# Patient Record
Sex: Male | Born: 1940
Health system: Southern US, Community
[De-identification: ages and names within clinical notes are randomized; demographics above are authoritative.]

## PROBLEM LIST (undated history)

## (undated) DIAGNOSIS — K449 Diaphragmatic hernia without obstruction or gangrene: Secondary | ICD-10-CM

## (undated) DIAGNOSIS — S065XAA Traumatic subdural hemorrhage with loss of consciousness status unknown, initial encounter: Secondary | ICD-10-CM

## (undated) DIAGNOSIS — R0609 Other forms of dyspnea: Secondary | ICD-10-CM

## (undated) DIAGNOSIS — J9611 Chronic respiratory failure with hypoxia: Secondary | ICD-10-CM

## (undated) DIAGNOSIS — J449 Chronic obstructive pulmonary disease, unspecified: Secondary | ICD-10-CM

## (undated) DIAGNOSIS — M1990 Unspecified osteoarthritis, unspecified site: Secondary | ICD-10-CM

## (undated) DIAGNOSIS — Z973 Presence of spectacles and contact lenses: Secondary | ICD-10-CM

## (undated) DIAGNOSIS — G4733 Obstructive sleep apnea (adult) (pediatric): Secondary | ICD-10-CM

## (undated) DIAGNOSIS — E119 Type 2 diabetes mellitus without complications: Secondary | ICD-10-CM

## (undated) DIAGNOSIS — N471 Phimosis: Secondary | ICD-10-CM

## (undated) DIAGNOSIS — Z9889 Other specified postprocedural states: Secondary | ICD-10-CM

## (undated) DIAGNOSIS — R0600 Dyspnea, unspecified: Secondary | ICD-10-CM

## (undated) DIAGNOSIS — Z8709 Personal history of other diseases of the respiratory system: Secondary | ICD-10-CM

## (undated) DIAGNOSIS — Z9989 Dependence on other enabling machines and devices: Secondary | ICD-10-CM

## (undated) DIAGNOSIS — I10 Essential (primary) hypertension: Secondary | ICD-10-CM

## (undated) HISTORY — PX: BACK SURGERY: SHX140

## (undated) HISTORY — PX: TONSILLECTOMY AND ADENOIDECTOMY: SUR1326

## (undated) HISTORY — PX: LUMBAR SPINE SURGERY: SHX701

---

## 1998-06-23 ENCOUNTER — Ambulatory Visit (HOSPITAL_COMMUNITY): Admission: RE | Admit: 1998-06-23 | Discharge: 1998-06-23 | Payer: Self-pay

## 2002-01-26 ENCOUNTER — Encounter (INDEPENDENT_AMBULATORY_CARE_PROVIDER_SITE_OTHER): Payer: Self-pay | Admitting: *Deleted

## 2002-01-26 ENCOUNTER — Ambulatory Visit (HOSPITAL_COMMUNITY): Admission: RE | Admit: 2002-01-26 | Discharge: 2002-01-26 | Payer: Self-pay | Admitting: Gastroenterology

## 2006-12-06 HISTORY — PX: KNEE ARTHROSCOPY: SHX127

## 2007-02-18 ENCOUNTER — Ambulatory Visit: Payer: Self-pay | Admitting: Emergency Medicine

## 2007-02-18 ENCOUNTER — Inpatient Hospital Stay (HOSPITAL_COMMUNITY): Admission: EM | Admit: 2007-02-18 | Discharge: 2007-02-23 | Payer: Self-pay | Admitting: Emergency Medicine

## 2007-03-02 ENCOUNTER — Ambulatory Visit: Payer: Self-pay | Admitting: Emergency Medicine

## 2007-04-11 ENCOUNTER — Ambulatory Visit: Payer: Self-pay | Admitting: Emergency Medicine

## 2007-07-26 ENCOUNTER — Ambulatory Visit: Payer: Self-pay | Admitting: Emergency Medicine

## 2007-08-28 ENCOUNTER — Ambulatory Visit: Payer: Self-pay | Admitting: Emergency Medicine

## 2007-09-06 DIAGNOSIS — G4733 Obstructive sleep apnea (adult) (pediatric): Secondary | ICD-10-CM

## 2007-09-06 DIAGNOSIS — I10 Essential (primary) hypertension: Secondary | ICD-10-CM

## 2007-09-06 DIAGNOSIS — M1990 Unspecified osteoarthritis, unspecified site: Secondary | ICD-10-CM | POA: Insufficient documentation

## 2007-09-06 DIAGNOSIS — J449 Chronic obstructive pulmonary disease, unspecified: Secondary | ICD-10-CM

## 2007-09-06 DIAGNOSIS — M5136 Other intervertebral disc degeneration, lumbar region: Secondary | ICD-10-CM

## 2007-09-06 DIAGNOSIS — Z6834 Body mass index (BMI) 34.0-34.9, adult: Secondary | ICD-10-CM

## 2007-10-02 ENCOUNTER — Ambulatory Visit: Payer: Self-pay | Admitting: Emergency Medicine

## 2008-04-27 ENCOUNTER — Emergency Department (HOSPITAL_COMMUNITY): Admission: EM | Admit: 2008-04-27 | Discharge: 2008-04-27 | Payer: Self-pay | Admitting: Emergency Medicine

## 2008-11-22 ENCOUNTER — Telehealth (INDEPENDENT_AMBULATORY_CARE_PROVIDER_SITE_OTHER): Payer: Self-pay | Admitting: *Deleted

## 2008-12-11 ENCOUNTER — Ambulatory Visit: Payer: Self-pay | Admitting: Emergency Medicine

## 2011-04-20 NOTE — Assessment & Plan Note (Signed)
[facility]                             PULMONARY OFFICE NOTE   NAME:NIEMCZURAKaydn, Kumpf                     MRN:          [id number]  DATE:07/26/2007                            DOB:          14-Aug-1941    SUBJECTIVE:  Mr. Brent Snow is a 70 year old man with a history of  tobacco use and obstructive sleep apnea who presents today in followup  for his exertional dyspnea.  Since our last visit, he has had pulmonary  function testing which we are going to review today.  He tells me that  he occasionally has had some shortness of breath with exertion, he has  to stop to rest after about an hour of working in his yard.  He denies  any cough.  He has not had any wheezing.  He is wearing his CPAP  reliably.  He does not use oxygen during the day, but does have oxygen  bled into his CPAP at night.   CURRENT MEDICATIONS:  1. Caduet 5/20 mg once daily.  2. Oxygen 2 L per minute nightly.  3. CPAP nightly.   PHYSICAL EXAMINATION:  IN GENERAL:  This is a pleasant, obese man who is  no distress on room air.  His weight is 243 pounds which is up 4 pounds  from our last visit.  Temperature 97.7, blood pressure 158/88, heart  rate 79, SPO2 91% on room air.  HEENT EXAM:  The oropharynx is narrow with some posterior pharyngeal  erythema.  His neck is supple without lymphadenopathy or stridor.  His lungs are clear to auscultation bilaterally but are distant.  There  is no wheezing on a forced expiration.  HEART:  Regular without murmur.  ABDOMEN:  Obese, soft, nontender with positive bowel sounds.  EXTREMITIES:  Have some trace pretibial edema.  Neurologically he has nonfocal exam.   Pulmonary function testing performed on July 26, 2007 shows moderate  airflow limitation without a significant bronchodilator response.  His  lung volumes are hyperinflated, and his diffusion capacity is decreased  but corrects into the normal range when adjusted for alveolar  volume.   IMPRESSION:  1. Moderate chronic obstructive pulmonary disease, not currently on      bronchodilators.  2. Obstructive sleep apnea with good compliance with his continuous      positive airway pressure.   PLAN:  1. We will initiate Spiriva 1 inhalation daily, and albuterol 2 puffs      q.4 h. p.r.n.  He will follow up with me in 1 month to assess his      improvement on standing bronchodilators.  2. He will continue his continuous positive airway pressure as      ordered.  3. I will discontinue his home oxygen.     Leslye Peer, MD  Electronically Signed    RSB/MedQ  DD: 07/26/2007  DT: 07/27/2007  Job #: [id number]   cc:   Tammy R. Collins Scotland, M.D.

## 2011-04-20 NOTE — Assessment & Plan Note (Signed)
[facility]                             PULMONARY OFFICE NOTE   NAME:NIEMCZURANathanel, Brent Snow                     MRN:          [id number]  DATE:08/28/2007                            DOB:          [birth year]    SUBJECTIVE:  Mr. Brent Snow is a 70 year old man with a history of  tobacco abuse and obstructive sleep apnea.  I have been following him  for exertional dyspnea. We placed him on Spiriva in the setting of air  flow limitation from PFTs from July 26, 2007.  He follows up today for  review of his progress since starting Spiriva.  He tells me that he has  been wearing his CPAP reliably.  He gets his device from [place].  He has a nasal mask which is uncomfortable, he wears it  about 5 out of 7 days of the week.  He has not noticed any difference in  his breathing since he began the Spiriva. He has been active and  continues to work.   CURRENT MEDICATIONS:  1. CPAP as indicated above.  2. Spiriva one inhalation daily.  3. Albuterol two puffs q.4 hours p.r.n. for shortness of breath.   EXAMINATION:  GENERAL:  This is an obese man in no distress on room air.  VITAL SIGNS:  Weight 247 pounds up four pounds from our last visit.  Temperature 98.1, blood pressure 168/86, heart rate 84, his PO2 is 92%  on room air.  With ambulation today he dropped 85% after 400 feet on  room air.  HEENT EXAM:  Benign.  LUNGS:  Clear but distant, there is no wheezing on forced expiration.  HEART:  Regular without murmur.  ABDOMEN:  Obese, soft, nontender with positive bowel sounds.  EXTREMITIES:  Have no significant edema.  NEUROLOGIC:  He has a nonfocal exam.   IMPRESSION:  1. Moderate chronic obstructive pulmonary disease.  2. Obstructive sleep apnea.  3. Exertional hypoxemia.   PLAN:  1. Mr. Brent Snow wants to have a trial off of bronchodilators to see      if he has any functional decline.  He will stop his Spiriva and use      albuterol on an as  needed basis.  2. Chronic obstructive pulmonary disease - use CPAP as ordered.  3. I underscored with Mr. Brent Snow that he needs exertional oxygen      based on today's ambulatory oximetry.  Unfortunately he is not      ready to do this and he refused.  We will discuss it further at our      next visit.  4. I will follow up with Mr. Brent Snow in 1 month to see if he      tolerated discontinuation of his Spiriva and to re-discuss oxygen.     Leslye Peer, MD  Electronically Signed    RSB/MedQ  DD: 09/14/2007  DT: 09/14/2007  Job #: [id number]   cc:   Tammy R. Collins Scotland, M.D.

## 2011-04-20 NOTE — Assessment & Plan Note (Signed)
[facility]                             PULMONARY OFFICE NOTE   NAME:[name], [name]                     MRN:          [id number]  DATE:10/02/2007                            DOB:          [birth year]    Mr. Brent Snow is a 70 year old man with COPD and exertional hypoxemia.  He also has long-standing obstructive sleep apnea that has been stable  on CPAP.  At our last visit, we decided to discontinue his Spiriva to  see if he could tolerate being off maintenance therapy.  He tells me  that initially he had done quite well off of the Spiriva.  Did not  notice any difference in his breathing or ability to exert himself.  He  has caught an upper respiratory infection that has been bothering him  for the last several days.  He has had nasal congestion and nasal  drainage.  He denies any wheezing or cough.  He has been using his  oxygen only on an as needed basis, although we have documented that he  desaturates with exertion.   MEDICATIONS:  Albuterol 2 puffs q.4h p.r.n. shortness of breath.   EXAM:  GENERAL:  This is an obese gentleman in no distress.  Weight is 256 pounds, up 9 pounds from our last visit.  Temperature  98.6, blood pressure 182/102.  Heart rate is 78, SpO2 90% on room air.  HEENT:  Posterior pharynx is narrow with some erythema.  NECK:  Supple without lymphadenopathy or stridor.  LUNGS:  Distant but clear to auscultation bilaterally.  He has no  crackles or wheezes.  HEART:  Regular without a murmur.  ABDOMEN:  Obese, soft, and nontender with positive bowel sounds.  EXTREMITIES:  Trace to 1+ pretibial edema.   IMPRESSION:  1. Chronic obstructive pulmonary disease, now off of maintenance      bronchodilators.  He tells me that he feels stable off of his      Spiriva.  He will continue his albuterol on an as needed basis.  We      discussed the benefits of being back on a scheduled bronchodilator.      He is not interested in doing  this at this time.  2. Upper respiratory infection.  He will use symptomatic therapy,      including Tylenol and Theraflu.  He will call me if he has signs or      symptoms consistent with an exacerbation of his chronic obstructive      pulmonary disease.  We reviewed these today.  3. Obstructive sleep apnea on CPAP.  He will continue his CPAP as      ordered.  4. Exertional hypoxemia.  I discussed wearing oxygen reliably with      him.  This may help his hypertension and his fluid retention.  He      tells me that he is not going to use the oxygen with all exertion.      He will use it only on an as needed basis.  5. Hypertension, which is significantly elevated today.  I offered to  recheck his blood pressure or to restart his Caduet.  He is not      interested in doing either at our visit today.  He says that he      believes that his hypertension is due to his increased weight.  He      does not want any      therapy at this time.  I encouraged him to have his blood pressure      rechecked by his primary physician, Brent Snow.  He stated that he      would do so.     Leslye Peer, MD  Electronically Signed    RSB/MedQ  DD: 10/02/2007  DT: 10/03/2007  Job #: [id number]   cc:   Brent Snow, M.D.

## 2011-04-23 NOTE — Assessment & Plan Note (Signed)
[facility]                             PULMONARY OFFICE NOTE   NAME:NIEMCZURANirav, Sweda                     MRN:          [id number]  DATE:04/11/2007                            DOB:          [birth year]    SUBJECTIVE:  Mr. Hochstetler is a 70 year old gentleman who follows up  today for a regularly scheduled visit.  He has a history of tobacco  abuse, hypertension, obstructive sleep apnea on CPAP.  He was admitted  to the hospital in mid-March for community-acquired pneumonia that  resulted in respiratory failure and required mechanical ventilation.  He  was since discharged to home, has been seen at the end of March by Rikki Spearing in our office.  He tells me that, since that time, he has  continued to stay off cigarettes.  His quit date was February 18, 2007.  He  is not experiencing any significant dyspnea.  He is wearing his CPAP  fairly reliably.  He does take if off sometimes in the middle of the  night.  He is not using any bronchodilators.  He does occasionally wear  oxygen during the day, although not because he is having any particular  shortness of breath.   CURRENT MEDICATIONS:  1. Caduet 5/20 mg once a day.  2. Oxygen 2 liter per minute p.r.n.  3. CPAP q.h.s.   EXAMINATION:  GENERAL:  This well-appearing obese gentleman in no  distress.  VITAL SIGNS:  His weight is 239 pounds, temperature 98.0, blood pressure  174/92, heart rate 58, SGO2 96% on room air.  HEENT:  Oropharynx is benign.  NECK:  Large, supple without lymphadenopathy or stridor.  LUNGS:  Distant but clear.  He is not having any wheezing or any forced  expiration.  HEART:  Regular rate and rhythm without murmur.  ABDOMEN:  Obese, soft, nontender with positive bowel sounds.  EXTREMITIES:  No cyanosis, clubbing or edema.   IMPRESSION:  1. History of tobacco use and probable COPD, not currently on      bronchodilators.  2. Obstructive sleep apnea with fair compliance with his  CPAP.  3. Hypoxemia with exertion that has resolved.   PLAN:  1. Will discontinue his oxygen during the day.  He will continue to      use it, blow it into his CPAP at night.  2. I will schedule him for full pulmonary function testing to follow      up in one month, quantify his air flow limitation.  If he does      indeed have significant COPD, then will start him on daily      bronchodilators.  He did not tolerate Advair, so will probably      choose Spiriva.  I      congratulated him and encouraged him to continue his tobacco      cessation.  3. I will follow with Mr. Ricketson in approximately one month.     Leslye Peer, MD  Electronically Signed    RSB/MedQ  DD: 04/11/2007  DT: 04/11/2007  Job #: [id number]

## 2011-04-23 NOTE — Discharge Summary (Signed)
Brent Snow, Brent Snow            ACCOUNT NO.:  [id number]   MEDICAL RECORD NO.:  [id number]          PATIENT TYPE:  INP   LOCATION:  5709                         FACILITY:  [facility]   PHYSICIAN:  Charlcie Cradle. Delford Field, MD, FCCPDATE OF BIRTH:  16-Aug-1941   DATE OF ADMISSION:  02/18/2007  DATE OF DISCHARGE:  02/23/2007                               DISCHARGE SUMMARY   DISCHARGE DIAGNOSES:  1. Status post acute respiratory failure with ventilator-dependent      respiratory failure secondary to community-acquired pneumonia (no      organism specified).  2. Hypertension.  3. Obstructive sleep apnea.   LABORATORY DATA:  Date:  February 23, 2007:  Phosphorus 3.5, magnesium 2.5.  White blood cells 7.3, hemoglobin 14.8, hematocrit 44.4, platelet count  166.  Sodium 140, potassium 3.9, chloride 100, CO2 35, glucose 126, BUN  17, creatinine 0.77.   MICROBIOLOGY:  Bronchoalveolar lavage obtained February 19, 2007:  Negative  for growth.  Date:  February 18, 2007:  Blood cultures x2 both no growth to  date.   RADIOLOGY:  Date:  February 22, 2007:  Demonstrating interval improvement  in bibasilar atelectases/infiltrate.   Date:  February 18, 2007:  CT scan of chest negative for pulmonary emboli.  Bilateral lower lobe consolidation consistent with pneumonia.   BRIEF HISTORY:  This is a 70 year old white male patient with a history  of tobacco abuse, obstructive sleep apnea, hypertension, and medical  noncompliance.  He reported to [facility] with worsening  shortness of breath, fever of 103, and lethargy.  Per his wife, this  initially began approximately over a week's time with progressive  shortness of breath, fever, flu-like symptoms, congestion, nonproductive  cough, and wheeze.  He was seen in the emergency room, identified to  have saturation at 50% on initial evaluation, as well as respiratory  acidosis.  He was placed on noninvasive positive-pressure ventilation;  however, did not tolerate  this and, therefore, was intubated.  His blood  pressure initially was as low as the 70s systolically requiring  aggressive volume resuscitation.  He was admitted to the pulmonary  critical care service.   HOSPITAL COURSE BY DISCHARGE DIAGNOSIS:  1. Acute hypercapnic respiratory failure with ventilator-dependent      respiratory failure in the setting of bilateral community-acquired      pneumonia (no organism specified).  Mr. Brent Snow was admitted to      the pulmonary critical care service in the intensive care setting.      He was continued on mechanical ventilation.  IV empiric antibiotics      consisted of azithromycin and Rocephin.  This was initiated on      hospital admit day, then transitioned to oral Avelox on February 22, 2007.  Mr. Brosnahan was successfully extubated on February 21, 2007.      He continues to require supplemental oxygen.  Currently, his      saturations do decrease down to 85% with ambulation on flat land in      the hallway.  Therefore, he will be sent home on supplemental  oxygen at 2 liters, this has been already set up by myself through      [employer] which is the medical equipment supplier for      Mr. Clos's CPAP machine.  Upon time of discharge, he is close      to baseline functioning with the exception of requiring      supplemental oxygen.  He will be discharged to home on daily Avelox      times 5 more days, as well as twice daily Advair, and close      pulmonary followup in the outpatient setting.  2. Hypertension.  Brent Snow has been instructed to fill his Caduet      prescription, which he has had and has not filled in the past.  And      he will, therefore, be discharged to home with these instructions.  3. Obstructive sleep apnea, would question whether or not Mr.      Snow actually has an element of chronic respiratory failure      given his elevated bicarbonate level at 35.  For now, Brent Snow      will be  discharged to home, instructed to continue his CPAP as      previously prescribed.  It should be noted that Brent Snow has      not used his CPAP machine as prescribed in the past.  I have      highlighted how important it is that he be compliant with his      therapy and the risks of hypoxia down the road.  Additionally, I      have requested [facility] assist with setting up his      CPAP machine so that 2 liters oxygen be bled into this during      bedtime hours.   PHYSICAL EXAMINATION UPON TIME OF DISCHARGE:  VITAL SIGNS:  Blood  pressure 174/88.  Vital signs stable, afebrile.  CHEST:  Sounds improving on exam.  CARDIAC:  Regular rate and rhythm.  ABDOMEN:  Soft, nontender.  EXTREMITIES:  Without edema.  NEUROLOGIC:  Neurologically, grossly intact.   Again, walking oximetries do demonstrate significant desaturation down  to 85%, which is markedly improved on supplemental oxygen at 2 liters.  He will, therefore, be discharged to home on supplemental oxygen.   DISCHARGE MEDICATIONS AND INSTRUCTIONS AS FOLLOWS:  1. Diet as tolerated.  2. Followup:      a.     Rubye Oaks, Nurse Practitioner, Thursday the 27th at       10:15 a.m.      b.     Dr. Delton Coombes, April 16, at 2:50 p.m.  3. Medications:      a.     Advair 250/50 one puff twice a day.      b.     Avelox 400 mg tab daily times 5 more days.      c.     Prednisone 10 mg tab 4 tabs a day times 3 days, then 3 tabs       a day times 3 days, then 2 tabs a day times 3 days, then 1 tab a       day times 3 days, then stop.      d.     Caduet 5/20 one tab daily.      e.     Oxygen 2 liters with activity and to be bled into the CPAP       machine.  Zenia Resides, NP      Charlcie Cradle. Delford Field, MD, [facility]  Electronically Signed    PB/MEDQ  D:  02/23/2007  T:  02/23/2007  Job:  [id number]   cc:   Rubye Oaks, NP  Leslye Peer, MD

## 2011-04-23 NOTE — Assessment & Plan Note (Signed)
[facility]                             PULMONARY OFFICE NOTE   NAME:NIEMCZURAConnelly, [place]                     MRN:          [id number]  DATE:03/02/2007                            DOB:          [birth year]    HISTORY OF PRESENT ILLNESS:  This is a 70 year old, white male patient,  new patient to our practice that presents for a post hospitalization  visit.  The patient was admitted on March 15 through March 20 for  progressively worsening shortness of breath, fever, T-max of 103, and  lethargy.  Patient has a known history of obstructive sleep apnea and  hypertension followed by Dr. Yehuda Budd for primary care.  The patient was  seen in the emergency room and on initial presentation had O2 saturation  of around 50%.  The patient was placed on a noninvasive positive  pressure ventilation; however, he was unable to tolerate this and  therefore was intubated and required mechanical ventilatory support.  Patient also was hypotensive with systolic pressures around 70.  The  patient was started on empiric antibiotics including Zithromycin and  Rocephin.  Chest x-ray showed bilateral lobe consolidation consistent  with pneumonia.  Blood cultures x2 were no growth to date, and a BAL was  negative for growth.  Patient was successfully extubated on March 18th  and was transitioned over to oral antibiotics.  Patient was discharged  on five additional days of Avelox and a prednisone taper, and patient  continued to desaturate to the mid to upper 80s with walking and was  discharged on oxygen with activities at 2 liters.  Patient does have  obstructive sleep apnea and had a CPAP at home.  Patient does report he  was not using his CPAP prior to hospitalization.  Since discharge, the  patient reports he is much improved with decreased cough, congestion,  and shortness of breath.  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  Patient  reports he does become  short of breath at times; however, activity tolerance seems to be  improving daily.  Patient denies any purulent sputum, hemoptysis, chest  pain, orthopnea, PND, or leg swelling.  The patient is also using his  CPAP at night.  The patient was also smoking prior to hospitalization,  however, assures me he has not returned to smoking as of yet.   PAST MEDICAL HISTORY:  1. Hypertension.  2. DJD and degenerative disk disease with previous back surgery in      1977.  3. Obstructive sleep apnea previously on CPAP.  4. Morbid obesity.   CURRENT MEDICATIONS:  1. Advair 250/50 twice daily (this was a new medication at discharge).  2. Caduet 5/20 q.h.s. (this was started at discharge).  3. Oxygen 2 liters with activity and a CPAP machine at night.   ALLERGIES:  No known drug allergies.   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  He is married.  He has two children.  He has smoked a  pack a day for the last 50 years.  He denies any alcohol use.  He is  followed for primary care issues by Dr. Yehuda Budd.   PHYSICAL EXAMINATION:  GENERAL:  The patient is a morbidly obese male.  VITAL SIGNS:  He is afebrile with a blood pressure of 142/84, pulse 72,  O2 saturation is 95% on room air.  Walking O2 saturation at 91%.  Weight  is at 235.  Height is at 5'6.  HEENT:  Unremarkable.  NECK:  Supple without cervical adenopathy.  No JVD.  LUNGS:  Lung sounds reveal coarse breath sounds without any wheezing or  crackles.  CARDIAC:  Regular rate and rhythm.  ABDOMEN:  Morbidly obese, soft, and nontender.  EXTREMITIES:  Warm without any calf tenderness, cyanosis or clubbing;  there is trace edema.  NEURO:  Alert and oriented x3.   IMPRESSION AND PLAN:  1. Status post acute respiratory failure with ventilator-dependent      respiratory failure secondary to a community-acquired pneumonia.      Patient is recommended to finish Avelox and  prednisone.  He is to      continue on Advair 250/50 twice daily.  He will follow back up with      Dr. Delton Coombes on April 16th at 2:50.  I have still recommended patient      to use oxygen with activity, and we will determine how much longer      oxygen needs to be used once he has followed up in two weeks with      Dr. Delton Coombes.  Patient is congratulated on his continued smoking      cessation and have encouraged him to continue no smoking.  2. Obstructive sleep apnea.  Patient will continue on nocturnal      continuous positive airway pressure.      Rubye Oaks, NP  Electronically Signed      Leslye Peer, MD  Electronically Signed   TP/MedQ  DD: 03/02/2007  DT: 03/02/2007  Job #: [id number]

## 2011-04-23 NOTE — Procedures (Signed)
[facility]  Patient:    Brent Snow, Brent Snow Visit Number: [id number] MRN: [id number]          Service Type: END Location: ENDO Attending Physician:  Louie Bun Dictated by:   Everardo All Madilyn Fireman, M.D. Proc. Date: 01/26/02 Admit Date:  01/26/2002   CC:         Tammy R. Collins Scotland, M.D.   Procedure Report  PROCEDURE:  Esophagogastroduodenoscopy with esophageal dilatation.  INDICATIONS FOR PROCEDURE:  Intermittent solid food dysphagia in a patient also undergoing colonoscopy today.  DESCRIPTION OF PROCEDURE:  The patient was placed in the left lateral decubitus position then placed on the pulse monitor with continuous low flow oxygen delivered by nasal cannula. He was sedated with 50 mg IV Demerol and 4 mg IV Versed. The Olympus video endoscope was advanced under direct vision into the oropharynx and esophagus. The esophagus was slightly tortuous and somewhat dilated distally with the squamocolumnar line at 36 cm above a 2 cm hiatal hernia. There did appear to be moderate inflammation and stricturing of the GE junction. There was a slight amount of resistance to passage of the scope beyond it. The stomach was entered and a small amount of liquid secretions were suctioned from the fundus. Retroflexed view of the cardia was unremarkable except for a small hiatal hernia. The fundus, body, antrum, and pylorus all appeared normal. The duodenum was entered and both the bulb and second portion were well inspected and appeared to be within normal limits. A Savary guidewire was placed through the endoscope channel into the duodenum and the scope withdrawn. Savary dilators of 12.8, 14, and 15 mm were passed over the guidewire with mild resistance and no blood seen on any of the dilators. The last dilators were removed together with the wire and the patient returned to the recovery room in stable condition. The patient tolerated the procedure well and there were no  immediate complications.  IMPRESSION:  1. Moderately inflamed esophageal stricture.  2. Hiatal hernia.  PLAN:  Will begin proton pump inhibitor and follow-up in the office in a few weeks to assess response to dilatation. Dictated by:   Everardo All Madilyn Fireman, M.D. Attending Physician:  Louie Bun DD:  01/26/02 TD:  01/26/02 Job: [id number]

## 2011-04-23 NOTE — Procedures (Signed)
[facility]  Patient:    Brent Snow, Brent Snow Visit Number: [id number] MRN: [id number]          Service Type: END Location: ENDO Attending Physician:  Louie Bun Dictated by:   Everardo All Madilyn Fireman, M.D. Proc. Date: 01/26/02 Admit Date:  01/26/2002   CC:         Tammy R. Collins Scotland, M.D.   Procedure Report  PROCEDURE:  Colonoscopy.  INDICATION FOR PROCEDURE:  Screening colonoscopy in a 70 year old patient with no previous colon screening.  DESCRIPTION OF PROCEDURE:  The patient was placed in the left lateral decubitus position and placed on the pulse monitor with continuous low flow oxygen delivered by nasal cannula. He was sedated with 50 mg IV Demerol and  3 mg IV Versed in addition to the 50 mg Demerol and 4 mg Versed given for the previous EGD with esophageal dilatation.  The Olympus video colonoscope was inserted into the rectum and advanced to the cecum, confirmed by transillumination at McBurneys point and visualization of the ileocecal valve and appendiceal orifice. The prep was good. The cecum, ascending, transverse, descending and sigmoid colon all appeared normal with no masses, polyps, diverticula or other mucosal abnormalities. Within the rectum at approximately 10 cm, there was a 6 mm sessile polyp which was fulgurated by hot biopsy, otherwise the rectum appeared normal. The colonoscope was then withdrawn and the patient returned to the recovery room in stable condition. The patient tolerated the procedure well and there were no immediate complications.  IMPRESSION:  Small rectal polyp otherwise normal colonoscopy.  PLAN:  Await pathology for determination of method and interval for future colon screening. Dictated by:   Everardo All Madilyn Fireman, M.D. Attending Physician:  Louie Bun DD:  01/26/02 TD:  01/26/02 Job: [id number]

## 2013-02-07 ENCOUNTER — Other Ambulatory Visit (HOSPITAL_COMMUNITY): Payer: Self-pay | Admitting: Family Medicine

## 2013-02-12 ENCOUNTER — Ambulatory Visit (HOSPITAL_COMMUNITY)
Admission: RE | Admit: 2013-02-12 | Discharge: 2013-02-12 | Disposition: A | Discharge status: Home / Self Care | Payer: Medicare Other | Source: Ambulatory Visit | Attending: Family Medicine | Admitting: Family Medicine

## 2013-02-12 DIAGNOSIS — R222 Localized swelling, mass and lump, trunk: Secondary | ICD-10-CM | POA: Insufficient documentation

## 2013-02-12 DIAGNOSIS — R911 Solitary pulmonary nodule: Secondary | ICD-10-CM | POA: Insufficient documentation

## 2013-02-12 DIAGNOSIS — Z85118 Personal history of other malignant neoplasm of bronchus and lung: Secondary | ICD-10-CM | POA: Insufficient documentation

## 2013-06-20 IMAGING — CT CT CHEST W/O CM
2 of 3 series · 14 of 36 positions shown, 17 images · non-contrast
Comparison: Chest CT 02/18/2007.

CLINICAL DATA: History of smoking for 35-40 years.  Lung cancer
screening.

CT CHEST WITHOUT CONTRAST
TECHNIQUE: Multidetector CT imaging of the chest was performed
following the standard protocol without IV contrast.

[Series 2: chestroutine 5.0 b40f · axial · 0.76mm/px · z∈[-342,-77]mm · 11 of 63 slices shown, 14 images]
[im 5/63  mediastinal]
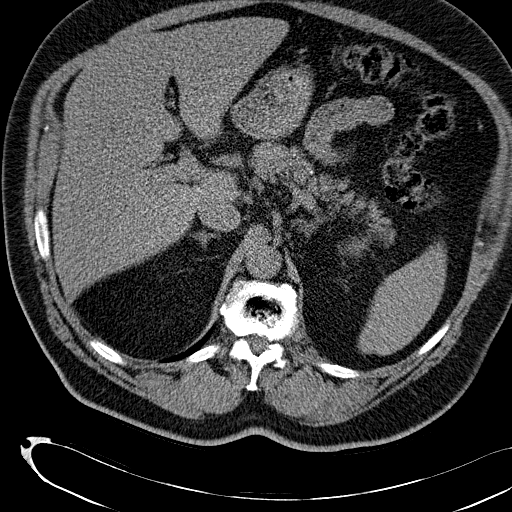
[im 5/63  lung]
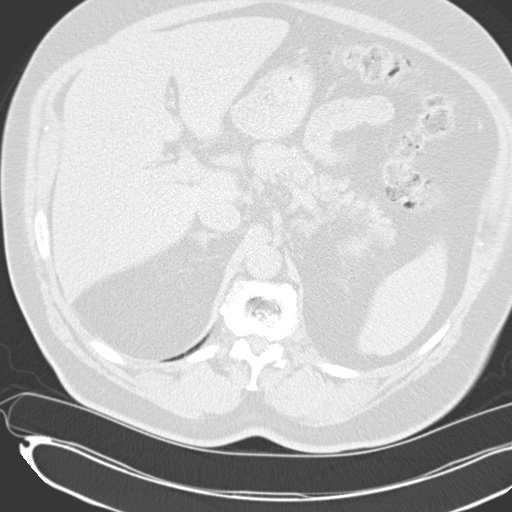
[im 10/63  lung]
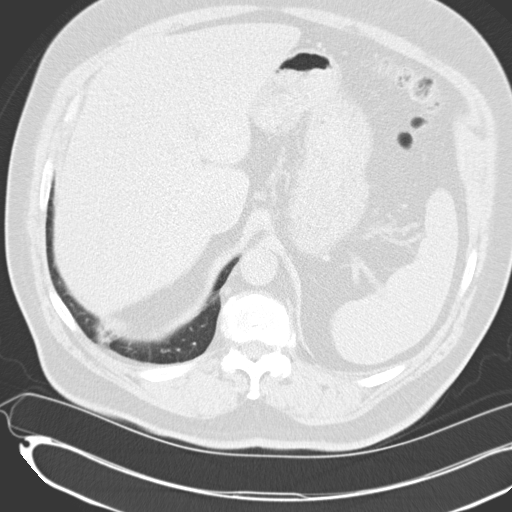
[im 14/63  lung]
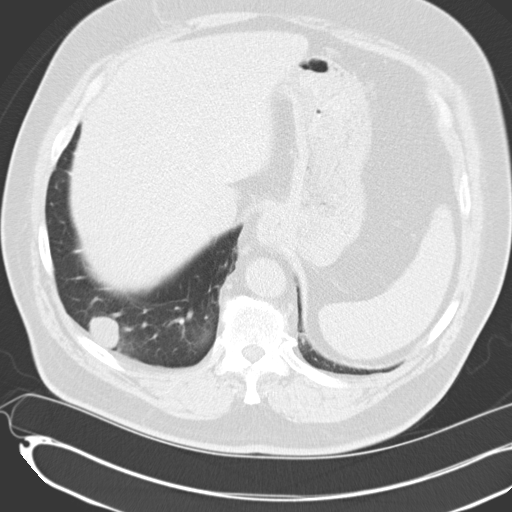
[im 21/63  lung]
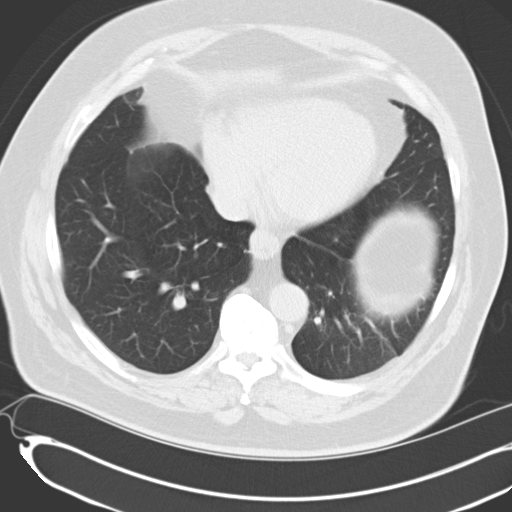
[im 26/63  mediastinal]
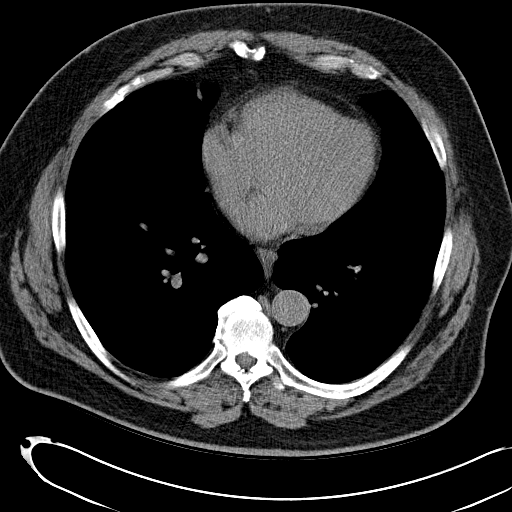
[im 26/63  lung]
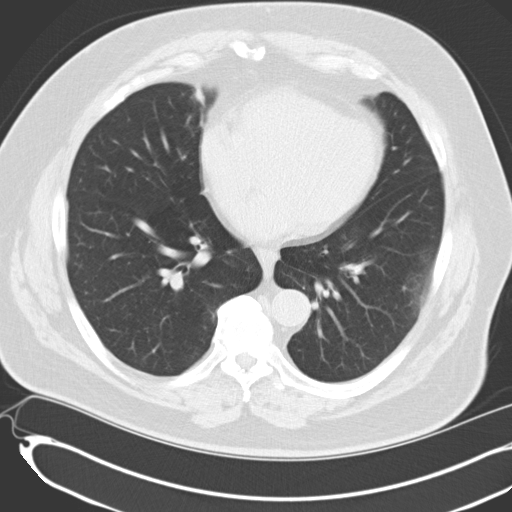
[im 33/63  lung]
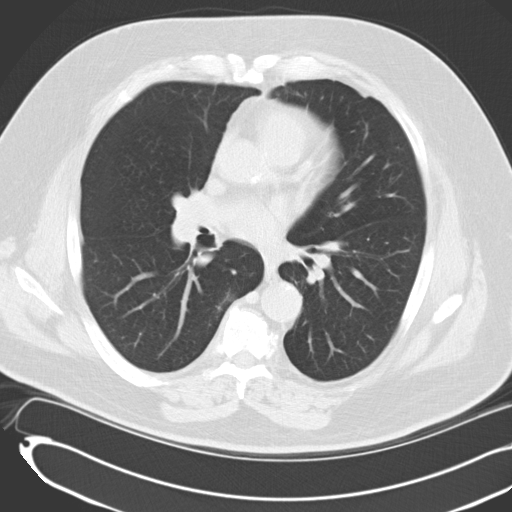
[im 37/63  lung]
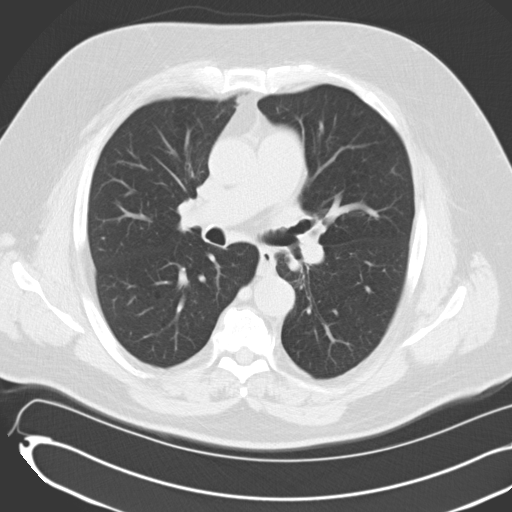
[im 42/63  lung]
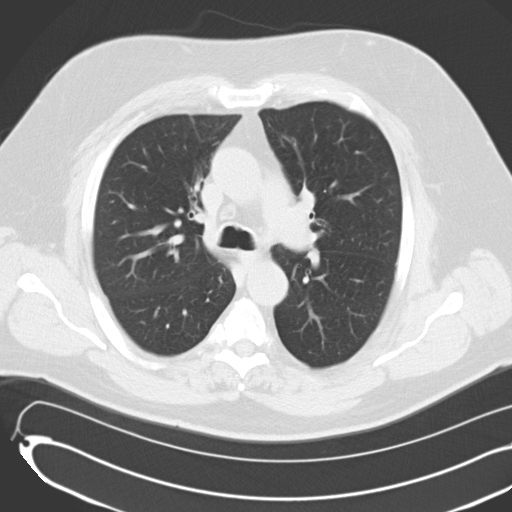
[im 49/63  mediastinal]
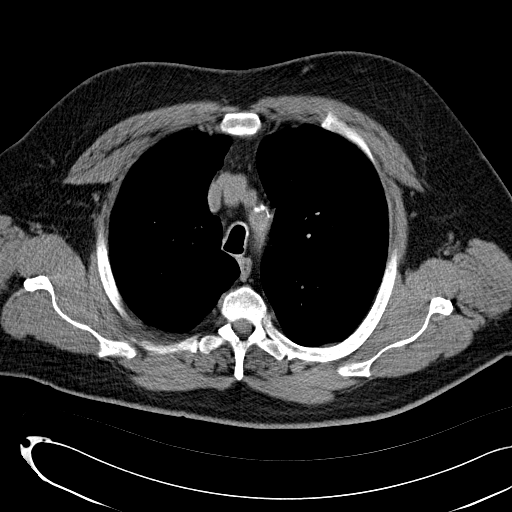
[im 49/63  lung]
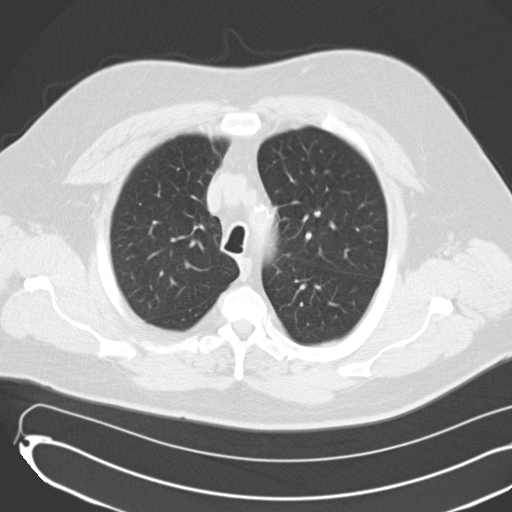
[im 53/63  lung]
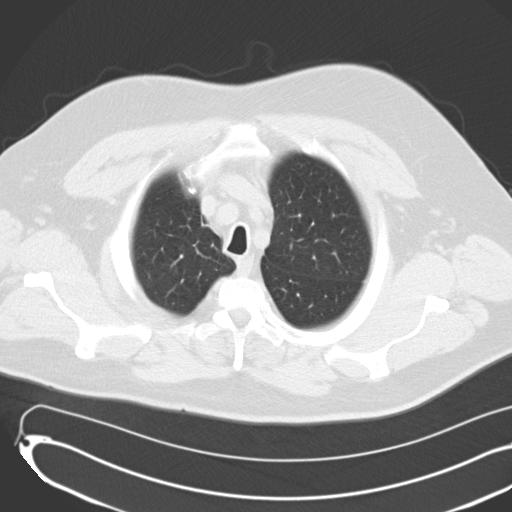
[im 58/63  lung]
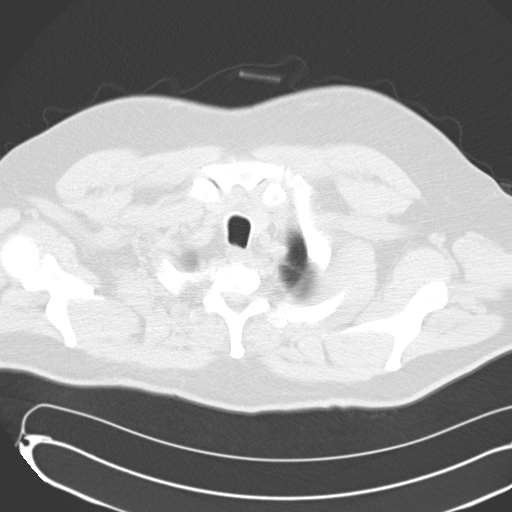

[Series 4: mpr coro 3mm · coronal · 0.60mm/px · 3 of 98 slices shown]
[im 20/98  lung]
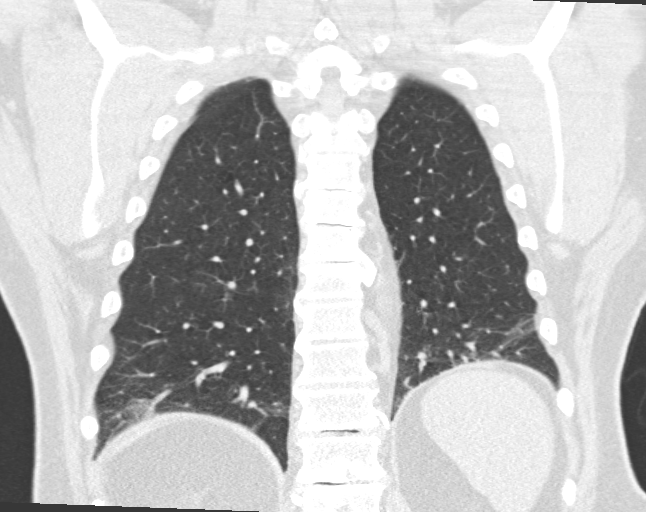
[im 39/98  lung]
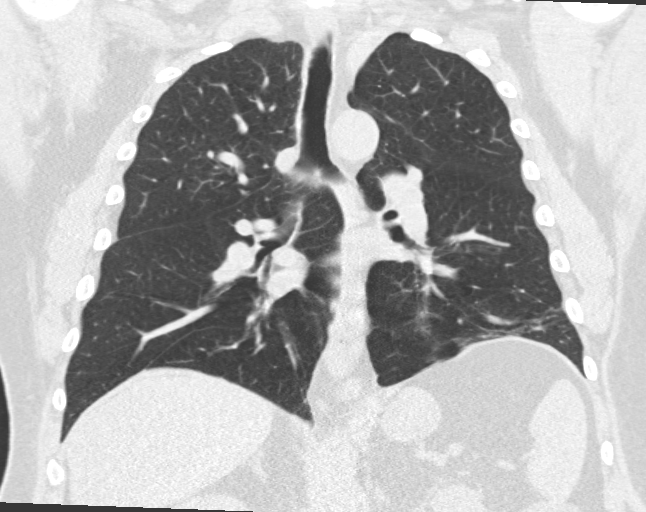
[im 59/98  lung]
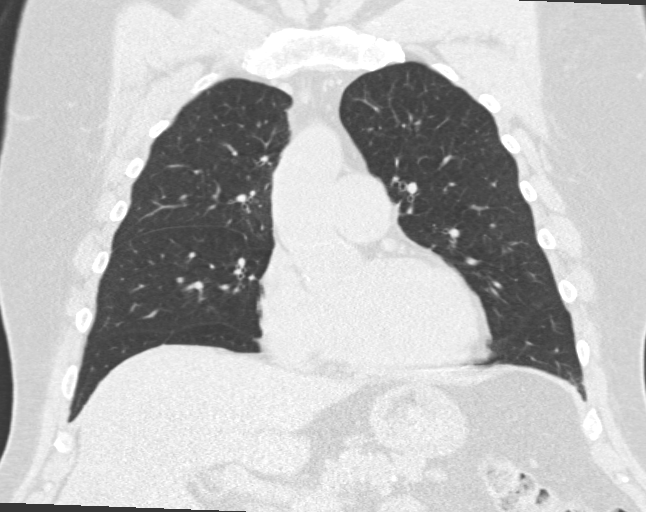

[14 of 36 positions shown; findings below may reference images not displayed]

FINDINGS: Mediastinum: Heart size is normal. There is no significant
pericardial fluid, thickening or pericardial calcification. There
is atherosclerosis of the thoracic aorta, the great vessels of the
mediastinum and the coronary arteries, including calcified
atherosclerotic plaque in the left anterior descending, left
circumflex and right coronary arteries. Calcifications of the
aortic valve. There is a small hiatal hernia. No pathologically
enlarged mediastinal or hilar lymph nodes. Please note that
accurate exclusion of hilar adenopathy is limited on noncontrast CT
scans.

Lungs/Pleura: In the posterior aspect of the right lower lobe there
is a pleural-based macrolobulated smoothly marginated 3.0 x 2.4 cm
mass that is generally low attenuation (-10 HU), suggesting areas
of internal lipid.  In retrospect, this lesion is similar in size
and appearance to the reported hypodense focus in the posterior
right lung base on the prior CT scan dated 02/18/2007, and is
therefore favored to represent a benign lesion such as a hamartoma.
No other definite suspicious-appearing pulmonary nodules or masses
are noted.  There is a 4 mm nodule in the right apex (image 13 of
series 3) which is unchanged in retrospect (this area was obscured
by motion on the prior examination), compatible with a benign
nodule. Minimal centrilobular emphysema noted, most evident in the
lung apices.  Linear opacities in the medial segment of the right
middle lobe, medial aspect of the lingula and periphery of the left
lower lobe are most compatible with areas of mild scarring.  No
pleural effusions.

Upper Abdomen: Incompletely visualized low attenuation lesion
adjacent to the spleen and the left kidney in the upper left
retroperitoneum is incompletely characterized on this examination,
but it is similar in retrospect compared to the prior study, likely
to represent a large exophytic left renal cyst.

Musculoskeletal: There are no aggressive appearing lytic or blastic
lesions noted in the visualized portions of the skeleton.
IMPRESSION: 1.  No suspicious appearing pulmonary nodules or masses on today's
examination to suggest a primary bronchogenic neoplasm.
2.  Smoothly marginated macrolobulated right lower lobe pleural-
based mass with evidence of internal lipid.  This is essentially
unchanged in size and appearance compared to prior study
02/18/2007, and is most compatible with a hamartoma.
3.  4 mm nodule in the right apex is unchanged in retrospect
compared to the prior examination, and can be considered
radiographically benign requiring no further imaging follow-up.
4.  Minimal centrilobular emphysema.
5.  Additional incidental findings, as above.

## 2013-09-18 ENCOUNTER — Ambulatory Visit (INDEPENDENT_AMBULATORY_CARE_PROVIDER_SITE_OTHER): Payer: Medicare Other | Admitting: Urology

## 2013-09-18 ENCOUNTER — Encounter (INDEPENDENT_AMBULATORY_CARE_PROVIDER_SITE_OTHER): Payer: Self-pay

## 2013-09-18 DIAGNOSIS — N529 Male erectile dysfunction, unspecified: Secondary | ICD-10-CM

## 2014-02-26 ENCOUNTER — Ambulatory Visit (INDEPENDENT_AMBULATORY_CARE_PROVIDER_SITE_OTHER): Payer: Medicare Other | Admitting: Emergency Medicine

## 2014-02-26 ENCOUNTER — Encounter (INDEPENDENT_AMBULATORY_CARE_PROVIDER_SITE_OTHER): Payer: Self-pay

## 2014-02-26 ENCOUNTER — Encounter: Payer: Self-pay | Admitting: Emergency Medicine

## 2014-02-26 VITALS — BP 140/96 | HR 76 | Ht 67.0 in | Wt 245.2 lb

## 2014-02-26 DIAGNOSIS — J449 Chronic obstructive pulmonary disease, unspecified: Secondary | ICD-10-CM | POA: Diagnosis not present

## 2014-02-26 DIAGNOSIS — G4733 Obstructive sleep apnea (adult) (pediatric): Secondary | ICD-10-CM | POA: Diagnosis not present

## 2014-02-26 NOTE — Assessment & Plan Note (Signed)
-   re titration study at home - get best fit mask

## 2014-02-26 NOTE — Progress Notes (Signed)
Subjective:    Patient ID: Brent Snow, male    DOB: Oct 03, 1941, 73 y.o.   MRN: [id number]  HPI 73 yo former smoker, HTN, OSA, DM.  Last seen by me in 2010 for COPD.  He never really felt that Spiriva did much, he remains on Symbicort. He has not had any flares for 5 years. He has noticed more SOB, bothers him with mild exertion, walking. He occasionally hears wheeze. Rare cough. Minimal mucous.   He wears CPAP rarely. Doesn't average once a week. [facility] is provider.     Review of Systems  Constitutional: Negative for fever and unexpected weight change.  HENT: Negative for congestion, dental problem, ear pain, nosebleeds, postnasal drip, rhinorrhea, sinus pressure, sneezing, sore throat and trouble swallowing.   Eyes: Negative for redness and itching.  Respiratory: Positive for shortness of breath and wheezing. Negative for cough and chest tightness.   Cardiovascular: Negative for palpitations and leg swelling.  Gastrointestinal: Negative for nausea and vomiting.  Genitourinary: Negative for dysuria.  Musculoskeletal: Positive for joint swelling.       Pain and stiffness  Skin: Negative for rash.  Neurological: Negative for headaches.  Hematological: Does not bruise/bleed easily.  Psychiatric/Behavioral: Positive for suicidal ideas. Negative for dysphoric mood. The patient is not nervous/anxious.     Past Medical History  Diagnosis Date  . Diabetes   . OSA (obstructive sleep apnea)   . High blood pressure      Family History  Problem Relation Age of Onset  . Asthma Mother   . Cancer Maternal Grandmother     stomach     History   Social History  . Marital Status: Married    Spouse Name: N/A    Number of Children: N/A  . Years of Education: N/A   Occupational History  . Not on file.   Social History Main Topics  . Smoking status: Former Smoker -- 2.00 packs/day for 50 years    Types: Cigarettes    Quit date: 12/06/2006  . Smokeless tobacco: Not on file  .  Alcohol Use: No  . Drug Use: No  . Sexual Activity: Not on file   Other Topics Concern  . Not on file   Social History Narrative  . No narrative on file     Not on File   No outpatient prescriptions prior to visit.   No facility-administered medications prior to visit.       Objective:   Physical Exam Filed Vitals:   02/26/14 1552  BP: 140/96  Pulse: 76  Height: 5\' 7"  (1.702 m)  Weight: 245 lb 3.2 oz (111.222 kg)  SpO2: 90%   Gen: Pleasant, well-nourished, in no distress,  normal affect  ENT: No lesions,  mouth clear,  oropharynx clear, no postnasal drip  Neck: No JVD, no TMG, no carotid bruits  Lungs: No use of accessory muscles, no dullness to percussion, clear without rales or rhonchi  Cardiovascular: RRR, heart sounds normal, no murmur or gallops, no peripheral edema  Abdomen: soft and NT, no HSM,  BS normal  Musculoskeletal: No deformities, no cyanosis or clubbing  Neuro: alert, non focal  Skin: Warm, no lesions or rashes      Assessment & Plan:  OBSTRUCTIVE SLEEP APNEA - re titration study at home - get best fit mask  CHRONIC OBSTRUCTIVE PULMONARY DISEASE, MODERATE - trial Anoro x 1 month - stop Symbicort - repeat PFT in the future - walking oximetry  - rov 1

## 2014-02-26 NOTE — Patient Instructions (Addendum)
We will perform a CPAP home titration study Stop symbicort Start Anoro 1 inhalation daily Walking oximetry today confirms that you need to wear oxygen. We will need to restart this. We will work on this next visit Follow with Dr Lamonte Sakai in 1 month

## 2014-02-26 NOTE — Assessment & Plan Note (Signed)
-   trial Anoro x 1 month - stop Symbicort - repeat PFT in the future - walking oximetry  - rov 1

## 2014-03-27 ENCOUNTER — Encounter: Payer: Self-pay | Admitting: Emergency Medicine

## 2014-03-27 ENCOUNTER — Telehealth: Payer: Self-pay | Admitting: Emergency Medicine

## 2014-03-27 ENCOUNTER — Ambulatory Visit (INDEPENDENT_AMBULATORY_CARE_PROVIDER_SITE_OTHER): Payer: Medicare Other | Admitting: Emergency Medicine

## 2014-03-27 VITALS — BP 122/88 | HR 63 | Ht 67.0 in | Wt 248.0 lb

## 2014-03-27 DIAGNOSIS — G4733 Obstructive sleep apnea (adult) (pediatric): Secondary | ICD-10-CM

## 2014-03-27 DIAGNOSIS — J449 Chronic obstructive pulmonary disease, unspecified: Secondary | ICD-10-CM

## 2014-03-27 MED ORDER — UMECLIDINIUM-VILANTEROL 62.5-25 MCG/INH IN AEPB: 1.0000 | INHALATION_SPRAY | Freq: Every day | RESPIRATORY_TRACT | Status: DC

## 2014-03-27 NOTE — Assessment & Plan Note (Addendum)
Please continue your Anoro once a day Use albuterol 2 puffs up to every 4 hours if needed for shortness of breath We need to start oxygen with exertion Follow with Dr Lamonte Sakai in 3 months or sooner if you have any problems.

## 2014-03-27 NOTE — Telephone Encounter (Signed)
Re-placed order to [employer] portable [employer] for o2.

## 2014-03-27 NOTE — Addendum Note (Signed)
Addended by: Carlos American A on: 03/27/2014 10:45 AM   Modules accepted: Orders

## 2014-03-27 NOTE — Progress Notes (Signed)
   Subjective:    Patient ID: Brent Snow, male    DOB: 04-15-1941, 73 y.o.   MRN: [id number]  HPI 73 yo former smoker, HTN, OSA, DM.  Last seen by me in 2010 for COPD.  He never really felt that Spiriva did much, he remains on Symbicort. He has not had any flares for 5 years. He has noticed more SOB, bothers him with mild exertion, walking. He occasionally hears wheeze. Rare cough. Minimal mucous.   He wears CPAP rarely. Doesn't average once a week. [facility] is provider.    ROV 03/27/14 -- follows for COPD, OSA/OHS. He has been wearing auto-set device for 3 weeks, is due to have data interpreted tomorrow.  He is now on Anoro, feels that he did not lose any ground off symbicort but not necessarily better.    Review of Systems  Constitutional: Negative for fever and unexpected weight change.  HENT: Negative for congestion, dental problem, ear pain, nosebleeds, postnasal drip, rhinorrhea, sinus pressure, sneezing, sore throat and trouble swallowing.   Eyes: Negative for redness and itching.  Respiratory: Positive for shortness of breath and wheezing. Negative for cough and chest tightness.   Cardiovascular: Negative for palpitations and leg swelling.  Gastrointestinal: Negative for nausea and vomiting.  Genitourinary: Negative for dysuria.  Musculoskeletal: Positive for joint swelling.       Pain and stiffness  Skin: Negative for rash.  Neurological: Negative for headaches.  Hematological: Does not bruise/bleed easily.  Psychiatric/Behavioral: Positive for suicidal ideas. Negative for dysphoric mood. The patient is not nervous/anxious.       Objective:   Physical Exam Filed Vitals:   03/27/14 1005  BP: 122/88  Pulse: 63  Height: 5\' 7"  (1.702 m)  Weight: 248 lb (112.492 kg)  SpO2: 92%   Gen: Pleasant, well-nourished, in no distress,  normal affect  ENT: No lesions,  mouth clear,  oropharynx clear, no postnasal drip  Neck: No JVD, no TMG, no carotid bruits  Lungs: No use of  accessory muscles, no dullness to percussion, clear without rales or rhonchi  Cardiovascular: RRR, heart sounds normal, no murmur or gallops, no peripheral edema  Abdomen: soft and NT, no HSM,  BS normal  Musculoskeletal: No deformities, no cyanosis or clubbing  Neuro: alert, non focal  Skin: Warm, no lesions or rashes      Assessment & Plan:  OBSTRUCTIVE SLEEP APNEA Continue to use your CPAP every night. We will get your download information from [facility] and adjust your CPAP pressure.  Follow with Dr Lamonte Sakai in 3 months or sooner if you have any problems.  CHRONIC OBSTRUCTIVE PULMONARY DISEASE, MODERATE Please continue your Anoro once a day Use albuterol 2 puffs up to every 4 hours if needed for shortness of breath Follow with Dr Lamonte Sakai in 3 months or sooner if you have any problems.

## 2014-03-27 NOTE — Patient Instructions (Addendum)
Please continue your Anoro once a day Use albuterol 2 puffs up to every 4 hours if needed for shortness of breath Continue to use your CPAP every night. We will get your download information from [facility] and adjust your CPAP pressure.  We will start oxygen with exertion at 2L/min.  Follow with Dr Lamonte Sakai in 3 months or sooner if you have any problems.

## 2014-03-27 NOTE — Assessment & Plan Note (Signed)
Continue to use your CPAP every night. We will get your download information from [facility] and adjust your CPAP pressure.  Follow with Dr Lamonte Sakai in 3 months or sooner if you have any problems.

## 2014-03-28 NOTE — Telephone Encounter (Signed)
error 

## 2014-04-08 ENCOUNTER — Telehealth: Payer: Self-pay | Admitting: Emergency Medicine

## 2014-04-08 MED ORDER — UMECLIDINIUM-VILANTEROL 62.5-25 MCG/INH IN AEPB: 1.0000 | INHALATION_SPRAY | Freq: Every day | RESPIRATORY_TRACT | Status: DC

## 2014-04-08 NOTE — Telephone Encounter (Signed)
Spoke with pt. Aware anoro has been sent to [facility]. Nothing further needed

## 2014-04-11 ENCOUNTER — Telehealth: Payer: Self-pay | Admitting: Emergency Medicine

## 2014-04-11 NOTE — Telephone Encounter (Signed)
Spoke with the pt  He states that he has not had a PFT done before  He did not want to set this up now, due to spouse having upcoming surgery for breast CA  He will call and let us know once he wants to have this done  [facility] for [place]

## 2014-04-11 NOTE — Telephone Encounter (Signed)
I do not see any recent PFT's in epic or pre/pst spiro. I looked in EMR and do not see any PFT's either. Is it okay to order RB? thanks

## 2014-04-11 NOTE — Telephone Encounter (Signed)
Call pt and ask him if he has had formal PFT's in the past. If so get the records, if not then order full PFT and forward results to pulm rehab. Thanks.

## 2014-04-11 NOTE — Telephone Encounter (Signed)
Pt returned call.  Brent Snow ° °

## 2014-04-11 NOTE — Telephone Encounter (Signed)
LMTCB

## 2014-04-12 NOTE — Telephone Encounter (Signed)
I spoke with Brent Snow and advised that the pt will call us back when he is able to schedule PFT. I advised to put rehab order on hold until then. [place], CMA

## 2014-05-08 ENCOUNTER — Telehealth: Payer: Self-pay | Admitting: Emergency Medicine

## 2014-05-08 DIAGNOSIS — J449 Chronic obstructive pulmonary disease, unspecified: Secondary | ICD-10-CM

## 2014-05-08 NOTE — Telephone Encounter (Signed)
Yes this ok to do

## 2014-05-08 NOTE — Telephone Encounter (Signed)
Called spoke with [place] from [place]. She reports pt is interested in getting POC. She will need an order for OCD titration and POC . Please advise RB if okay to do? thanks

## 2014-05-08 NOTE — Telephone Encounter (Signed)
Order was sent to [facility] 

## 2014-05-27 ENCOUNTER — Telehealth: Payer: Self-pay | Admitting: Emergency Medicine

## 2014-05-28 NOTE — Telephone Encounter (Signed)
Mandy returned call.  States that order was found.  Nothing further needed at this time.  Satira Anis

## 2014-05-28 NOTE — Telephone Encounter (Signed)
[place] x 1 for Mandy.  -  It looks like order was faxed to [place] on 05/09/14 for POC and OCD by [facility].

## 2014-05-31 DIAGNOSIS — E119 Type 2 diabetes mellitus without complications: Secondary | ICD-10-CM | POA: Diagnosis not present

## 2014-05-31 DIAGNOSIS — E785 Hyperlipidemia, unspecified: Secondary | ICD-10-CM | POA: Diagnosis not present

## 2014-05-31 DIAGNOSIS — I10 Essential (primary) hypertension: Secondary | ICD-10-CM | POA: Diagnosis not present

## 2014-06-20 ENCOUNTER — Encounter: Payer: Self-pay | Admitting: Emergency Medicine

## 2014-06-20 ENCOUNTER — Ambulatory Visit (INDEPENDENT_AMBULATORY_CARE_PROVIDER_SITE_OTHER): Payer: Medicare Other | Admitting: Emergency Medicine

## 2014-06-20 ENCOUNTER — Encounter (INDEPENDENT_AMBULATORY_CARE_PROVIDER_SITE_OTHER): Payer: Self-pay

## 2014-06-20 VITALS — BP 130/80 | HR 82 | Ht 66.0 in | Wt 242.6 lb

## 2014-06-20 DIAGNOSIS — J449 Chronic obstructive pulmonary disease, unspecified: Secondary | ICD-10-CM | POA: Diagnosis not present

## 2014-06-20 DIAGNOSIS — G4733 Obstructive sleep apnea (adult) (pediatric): Secondary | ICD-10-CM | POA: Diagnosis not present

## 2014-06-20 NOTE — Patient Instructions (Signed)
We will repeat your PFT at [facility] so that you can proceed with Pulmonary Rehab Please continue your Anoro and ProAir as you are using them  Wear your oxygen with exertion with a goal of SpO2 > 90%.  Wear your CPAP every night We will investigate whether [facility] can provide you with a portable oxygen concentrator Follow with Dr Lamonte Sakai in 4 months or sooner if you have any problems.

## 2014-06-20 NOTE — Assessment & Plan Note (Signed)
CPAP qhs, good compliance

## 2014-06-20 NOTE — Assessment & Plan Note (Signed)
We will repeat your PFT at [facility] so that you can proceed with Pulmonary Rehab Please continue your Anoro and ProAir as you are using them  Wear your oxygen with exertion with a goal of SpO2 > 90%.  We will investigate whether [facility] can provide you with a portable oxygen concentrator Follow with Dr Lamonte Sakai in 4 months or sooner if you have any problems

## 2014-06-20 NOTE — Progress Notes (Signed)
   Subjective:    Patient ID: Brent Snow, male    DOB: 03-18-1941, 73 y.o.   MRN: [id number]  HPI 73 yo former smoker, HTN, OSA, DM.  Last seen by me in 2010 for COPD.  He never really felt that Spiriva did much, he remains on Symbicort. He has not had any flares for 5 years. He has noticed more SOB, bothers him with mild exertion, walking. He occasionally hears wheeze. Rare cough. Minimal mucous.   He wears CPAP rarely. Doesn't average once a week. [facility] is provider.    ROV 03/27/14 -- follows for COPD, OSA/OHS. He has been wearing auto-set device for 3 weeks, is due to have data interpreted tomorrow.  He is now on Anoro, feels that he did not lose any ground off symbicort but not necessarily better.   ROV 06/20/14 -- follows for COPD, OSA/OHS, exertional hypoxemia. He has been doing well. He is reliable with his CPAP + O2 bled in.  He has [employer] and is interested in a Marine scientist. His breathing is better since he started using o2 more reliably. He is using albuterol 2 x a day. He is on Anoro qd. He needs full PFT in order to get into pulm rehab.    Review of Systems  Constitutional: Negative for fever and unexpected weight change.  HENT: Negative for congestion, dental problem, ear pain, nosebleeds, postnasal drip, rhinorrhea, sinus pressure, sneezing, sore throat and trouble swallowing.   Eyes: Negative for redness and itching.  Respiratory: Positive for shortness of breath and wheezing. Negative for cough and chest tightness.   Cardiovascular: Negative for palpitations and leg swelling.  Gastrointestinal: Negative for nausea and vomiting.  Genitourinary: Negative for dysuria.  Musculoskeletal: Positive for joint swelling.       Pain and stiffness  Skin: Negative for rash.  Neurological: Negative for headaches.  Hematological: Does not bruise/bleed easily.  Psychiatric/Behavioral: Positive for suicidal ideas. Negative for dysphoric mood. The patient is not  nervous/anxious.       Objective:   Physical Exam Filed Vitals:   06/20/14 1448  BP: 130/80  Pulse: 82  Height: 5\' 6"  (1.676 m)  Weight: 242 lb 9.6 oz (110.043 kg)  SpO2: 90%   Gen: Pleasant, overweight, in no distress,  normal affect  ENT: No lesions,  mouth clear,  oropharynx clear, no postnasal drip  Neck: No JVD, no TMG, no carotid bruits  Lungs: No use of accessory muscles, no dullness to percussion, clear without rales or rhonchi  Cardiovascular: RRR, heart sounds normal, no murmur or gallops, no peripheral edema  Musculoskeletal: No deformities, no cyanosis or clubbing  Neuro: alert, non focal  Skin: Warm, no lesions or rashes     Assessment & Plan:  CHRONIC OBSTRUCTIVE PULMONARY DISEASE, MODERATE We will repeat your PFT at [facility] so that you can proceed with Pulmonary Rehab Please continue your Anoro and ProAir as you are using them  Wear your oxygen with exertion with a goal of SpO2 > 90%.  We will investigate whether [facility] can provide you with a portable oxygen concentrator Follow with Dr Lamonte Sakai in 4 months or sooner if you have any problems  OBSTRUCTIVE SLEEP APNEA CPAP qhs, good compliance

## 2014-07-03 ENCOUNTER — Ambulatory Visit (HOSPITAL_COMMUNITY)
Admission: RE | Admit: 2014-07-03 | Discharge: 2014-07-03 | Disposition: A | Discharge status: Home / Self Care | Payer: Medicare Other | Source: Ambulatory Visit | Attending: Emergency Medicine | Admitting: Emergency Medicine

## 2014-07-03 DIAGNOSIS — J4489 Other specified chronic obstructive pulmonary disease: Secondary | ICD-10-CM | POA: Insufficient documentation

## 2014-07-03 DIAGNOSIS — J449 Chronic obstructive pulmonary disease, unspecified: Secondary | ICD-10-CM

## 2014-07-03 MED ORDER — ALBUTEROL SULFATE (2.5 MG/3ML) 0.083% IN NEBU
2.5000 mg | INHALATION_SOLUTION | Freq: Once | RESPIRATORY_TRACT | Status: AC
  Administered 2014-07-03: 2.5 mg via RESPIRATORY_TRACT

## 2014-07-13 LAB — PULMONARY FUNCTION TEST
DL/VA % PRED: 93 %
DL/VA: 4.03 ml/min/mmHg/L
DLCO cor % pred: 61 %
DLCO cor: 16.56 ml/min/mmHg
DLCO unc % pred: 61 %
DLCO unc: 16.56 ml/min/mmHg
FEF 25-75 Post: 0.45 L/sec
FEF 25-75 Pre: 0.52 L/sec
FEF2575-%CHANGE-POST: -12 %
FEF2575-%PRED-POST: 22 %
FEF2575-%Pred-Pre: 25 %
FEV1-%CHANGE-POST: -1 %
FEV1-%PRED-POST: 45 %
FEV1-%PRED-PRE: 46 %
FEV1-POST: 1.24 L
FEV1-Pre: 1.26 L
FEV1FVC-%CHANGE-POST: 0 %
FEV1FVC-%PRED-PRE: 69 %
FEV6-%Change-Post: -1 %
FEV6-%PRED-POST: 66 %
FEV6-%Pred-Pre: 67 %
FEV6-PRE: 2.36 L
FEV6-Post: 2.32 L
FEV6FVC-%Change-Post: 0 %
FEV6FVC-%PRED-PRE: 101 %
FEV6FVC-%Pred-Post: 101 %
FVC-%Change-Post: -2 %
FVC-%Pred-Post: 65 %
FVC-%Pred-Pre: 66 %
FVC-POST: 2.43 L
FVC-Pre: 2.49 L
POST FEV1/FVC RATIO: 51 %
POST FEV6/FVC RATIO: 96 %
PRE FEV6/FVC RATIO: 95 %
Pre FEV1/FVC ratio: 50 %
RV % PRED: 177 %
RV: 4.02 L
TLC % PRED: 102 %
TLC: 6.35 L

## 2014-08-05 DIAGNOSIS — E785 Hyperlipidemia, unspecified: Secondary | ICD-10-CM | POA: Diagnosis not present

## 2014-08-05 DIAGNOSIS — E119 Type 2 diabetes mellitus without complications: Secondary | ICD-10-CM | POA: Diagnosis not present

## 2014-08-05 DIAGNOSIS — I10 Essential (primary) hypertension: Secondary | ICD-10-CM | POA: Diagnosis not present

## 2014-09-02 DIAGNOSIS — E119 Type 2 diabetes mellitus without complications: Secondary | ICD-10-CM | POA: Diagnosis not present

## 2014-09-02 DIAGNOSIS — E785 Hyperlipidemia, unspecified: Secondary | ICD-10-CM | POA: Diagnosis not present

## 2014-09-02 DIAGNOSIS — I10 Essential (primary) hypertension: Secondary | ICD-10-CM | POA: Diagnosis not present

## 2014-10-08 ENCOUNTER — Encounter (HOSPITAL_COMMUNITY): Payer: Medicare Other

## 2014-10-23 ENCOUNTER — Encounter (INDEPENDENT_AMBULATORY_CARE_PROVIDER_SITE_OTHER): Payer: Self-pay

## 2014-10-23 ENCOUNTER — Ambulatory Visit: Payer: Medicare Other | Admitting: Emergency Medicine

## 2014-10-24 ENCOUNTER — Ambulatory Visit (INDEPENDENT_AMBULATORY_CARE_PROVIDER_SITE_OTHER): Payer: Medicare Other | Admitting: Emergency Medicine

## 2014-10-24 ENCOUNTER — Encounter: Payer: Self-pay | Admitting: Emergency Medicine

## 2014-10-24 VITALS — BP 130/72 | HR 72 | Ht 66.0 in | Wt 247.0 lb

## 2014-10-24 DIAGNOSIS — J449 Chronic obstructive pulmonary disease, unspecified: Secondary | ICD-10-CM

## 2014-10-24 MED ORDER — ALBUTEROL SULFATE HFA 108 (90 BASE) MCG/ACT IN AERS: 2.0000 | INHALATION_SPRAY | RESPIRATORY_TRACT | Status: DC | PRN

## 2014-10-24 MED ORDER — UMECLIDINIUM-VILANTEROL 62.5-25 MCG/INH IN AEPB: 1.0000 | INHALATION_SPRAY | Freq: Every day | RESPIRATORY_TRACT | Status: DC

## 2014-10-24 NOTE — Patient Instructions (Addendum)
Please continue your Anoro as you are taking it, we will order through [facility] Use albuterol as needed, we will order through [facility] pulmonary rehab as planned  Follow with Dr Lamonte Sakai in 6 months or sooner if you have any problems

## 2014-10-24 NOTE — Progress Notes (Signed)
Subjective:    Patient ID: Brent Snow, male    DOB: 1940-12-11, 73 y.o.   MRN: [id number]  HPI 73 yo former smoker, HTN, OSA, DM.  Last seen by me in 2010 for COPD.  He never really felt that Spiriva did much, he remains on Symbicort. He has not had any flares for 5 years. He has noticed more SOB, bothers him with mild exertion, walking. He occasionally hears wheeze. Rare cough. Minimal mucous.   He wears CPAP rarely. Doesn't average once a week. [facility] is provider.    ROV 03/27/14 -- follows for COPD, OSA/OHS. He has been wearing auto-set device for 3 weeks, is due to have data interpreted tomorrow.  He is now on Anoro, feels that he did not lose any ground off symbicort but not necessarily better.   ROV 06/20/14 -- follows for COPD, OSA/OHS, exertional hypoxemia. He has been doing well. He is reliable with his CPAP + O2 bled in.  He has [employer] and is interested in a Marine scientist. His breathing is better since he started using o2 more reliably. He is using albuterol 2 x a day. He is on Anoro qd. He needs full PFT in order to get into pulm rehab.   ROV 10/24/14 -- follow-up visit for COPD, obstructive sleep apnea and exertional hypoxemia. He wears CPAP reliably. Underwent PFT 07/13/14 , FEV1 1.26 (46%). He is scheduled to go to pulm rehab at [facility] starting 12/4.  Has been doing well since last time with some good and bad days. He has some occasional wheeze. He wears his O2 prn - whenever he feels SOB, has to walk an incline. No flares since last time.    Review of Systems  Constitutional: Negative for fever and unexpected weight change.  HENT: Negative for congestion, dental problem, ear pain, nosebleeds, postnasal drip, rhinorrhea, sinus pressure, sneezing, sore throat and trouble swallowing.   Eyes: Negative for redness and itching.  Respiratory: Positive for shortness of breath and wheezing. Negative for cough and chest tightness.   Cardiovascular: Negative for palpitations and  leg swelling.  Gastrointestinal: Negative for nausea and vomiting.  Genitourinary: Negative for dysuria.  Musculoskeletal: Negative for joint swelling.       Pain and stiffness  Skin: Negative for rash.  Neurological: Negative for headaches.  Hematological: Does not bruise/bleed easily.  Psychiatric/Behavioral: Negative for suicidal ideas and dysphoric mood. The patient is not nervous/anxious.       Objective:   Physical Exam Filed Vitals:   10/24/14 1640  BP: 130/72  Pulse: 72  Height: 5\' 6"  (1.676 m)  Weight: 247 lb (112.038 kg)  SpO2: 93%   Gen: Pleasant, overweight, in no distress,  normal affect  ENT: No lesions,  mouth clear,  oropharynx clear, no postnasal drip  Neck: No JVD, no TMG, no carotid bruits  Lungs: No use of accessory muscles, clear without rales or rhonchi  Cardiovascular: RRR, heart sounds normal, no murmur or gallops, no peripheral edema  Musculoskeletal: No deformities, no cyanosis or clubbing  Neuro: alert, non focal  Skin: Warm, no lesions or rashes     Assessment & Plan:  Obstructive sleep apnea Continue same CPAP daily at bedtime  COPD (chronic obstructive pulmonary disease) He is doing well on anoro. He is about to start pulmonary rehabilitation. He was offered a flu shot and declined  Please continue your Anoro as you are taking it, we will order through [facility] Use albuterol as needed, we will order through [facility]    Start pulmonary rehab as planned  Follow with Dr Lamonte Sakai in 6 months or sooner if you have any problems

## 2014-10-24 NOTE — Assessment & Plan Note (Signed)
Continue same CPAP daily at bedtime

## 2014-10-24 NOTE — Assessment & Plan Note (Signed)
He is doing well on anoro. He is about to start pulmonary rehabilitation. He was offered a flu shot and declined  Please continue your Anoro as you are taking it, we will order through [facility] Use albuterol as needed, we will order through [facility] pulmonary rehab as planned  Follow with Dr Lamonte Sakai in 6 months or sooner if you have any problems

## 2014-11-08 ENCOUNTER — Encounter (HOSPITAL_COMMUNITY): Payer: Self-pay

## 2014-11-08 ENCOUNTER — Encounter (HOSPITAL_COMMUNITY)
Admission: RE | Admit: 2014-11-08 | Discharge: 2014-11-08 | Disposition: A | Discharge status: Home / Self Care | Payer: Medicare Other | Source: Ambulatory Visit | Attending: Emergency Medicine | Admitting: Emergency Medicine

## 2014-11-08 VITALS — BP 144/70 | HR 90 | Wt 245.3 lb

## 2014-11-08 DIAGNOSIS — J449 Chronic obstructive pulmonary disease, unspecified: Secondary | ICD-10-CM | POA: Insufficient documentation

## 2014-11-08 DIAGNOSIS — J441 Chronic obstructive pulmonary disease with (acute) exacerbation: Secondary | ICD-10-CM

## 2014-11-08 NOTE — Patient Instructions (Signed)
Pt has finished orientation and is scheduled to start CR on November 11 2014 at 1430. Pt has been instructed to arrive to class 15 minutes early for scheduled class. Pt has been instructed to wear comfortable clothing and shoes with rubber soles. Pt has been told to take their medications 1 hour prior to coming to class.  If the patient is not going to attend class, he has been instructed to call.

## 2014-11-08 NOTE — Progress Notes (Addendum)
Patient referred to Cardiac Rehab by Dr. Malvin Johns due to COPD (J44.1). Dr. Freda Munro is his cardiologist and Dr. Modena Morrow is his PCP.  During orientation advised patient on arrival and appointment times what to wear, what to do before, during and after exercise.  Reviewed attendance and class policy.  Talked about inclement weather and class consultation policy. Patient is scheduled to start cardiac Rehab on November 11, 2014 at 1430. Patient was advised to come to class 5 minutes before class starts.  He was also given instructions on meeting with the dietician and attending the Family Structure classes. Pt is eager to get started.  Pt completed walk test with one rest break to increase his O2 Sats up from 83%. Got O2 Sats back up and patient  was able to complete walk test. Discussed pursed lip breathing. Handout given to demonstrate technique.

## 2014-11-11 ENCOUNTER — Encounter (HOSPITAL_COMMUNITY)
Admission: RE | Admit: 2014-11-11 | Discharge: 2014-11-11 | Disposition: A | Discharge status: Home / Self Care | Payer: Medicare Other | Source: Ambulatory Visit | Attending: Emergency Medicine | Admitting: Emergency Medicine

## 2014-11-11 DIAGNOSIS — J449 Chronic obstructive pulmonary disease, unspecified: Secondary | ICD-10-CM | POA: Diagnosis not present

## 2014-11-11 NOTE — Psychosocial Assessment (Signed)
Brent Snow 73 y.o. male  Initial Psychosocial Assessment  Pt psychosocial assessment reveals pt lives with their spouse. Pt is currently retired. Pt hobbies include golfing. Pt reports his stress level is low. Areas of stress/anxiety include Health.  Pt does not exhibit  signs of depression.  Pt shows good  coping skills with positive outlook . Staff offered emotional support and reassurance. Monitor and evaluate progress toward psychosocial goal(s).  Goal(s): Do activities longer Lose weight Help patient work toward returning to meaningful activities that improve patient's QOL and are attainable with patient's lung disease   11/11/2014 4:23 PM

## 2014-11-11 NOTE — Progress Notes (Signed)
[facility] Pulmonary Rehabilitation Baseline Outcomes Assessment   Anthropometrics:  . Height (inches): 64 . Weight (kg): 111.5 . Grip strength was measured using a Dynamometer.  The patient's highest score was a 80.  Functional Status/Exercise Capacity: . Brent Snow had a resting heart rate of 90 BPM, a resting blood pressure of 144/70, and an oxygen saturation of 91% on 0 liters of O2.  Brent Snow performed a 6-minute walk test on 11/08/14.  The patient completed 900 feet in 6 minutes with 1 rest break.  This quantifies 2.3 METS.   Dyspnea Measures: . The [facility] is a simple and standardized method of classifying disability in patients with COPD.  The assessment correlates disability and dyspnea.  At entrance the patient scored a 4. The scale is provided below.   0= I only get breathless with strenuous exercise. 1= I get short of breath when hurrying on level ground or walking up a slight incline. 2= On level ground, I walk slower than people of the same age because of breathlessness, or have to stop for breath when walking at my own pace. 3= I stop for breath after walking 100 yards or after a few minutes on level ground. 4=I am too breathless to leave the house or I am breathless when dressing.   . The patient completed the [place] ([facility] [place]).  This questionnaire relates activities of daily living and shortness of breath.  The score ranges from 0-120, a higher score relates to severe shortness of breath during activities of daily living. The patient's score at entrance was 60.  Quality of Life: . Ferrans and Powers Quality of Life Index Pulmonary Version is used to assess the patients satisfaction in different domains of their life; health and functioning, socioeconomic, psychological/spiritual, and family. The overall score is recorded out of 30 points.  The patient's goal is to achieve an overall score of 21 or higher.  Brent Snow  received a 20.6 at entrance.  . The Patient Health Questionnaire (PHQ-2) is a first step approach for the screening of depression.  If the patient scores positive on the PHQ-2 the patient should be further assessed with the PHQ-9.  The Patient Health Questionnaire (PHQ-9) assesses the degree of depression.  Depression is important to monitor and track in pulmonary patients due to its prevalence in the population.  If the patient advances to the PHQ-9 the goal is to score less than 4 on this assessment.  Brent Snow scored a 0 at entrance.  Clinical Assessment Tools: . The COPD Assessment Test (CAT) is a measurement tool to quantify how much of an impact the disease has on the patient's life.  This assessment aids the Pulmonary Rehab Team in designing the patients individualized treatment plan.  A CAT score ranges from 0-40.  A score of 10 or below indicates that COPD has a low impact on the patient's life whereas a score of 30 or higher indicates a severe impact. The patient's goal is a decrease of 1 point from entrance to discharge.  Brent Snow had a CAT score of 13 at entrance.  Nutrition: . The "Rate My Plate" is a dietary assessment that quantifies the balance of a patient's diet.  This tool allows the Pulmonary Rehab Team to key in on the areas of the patient's diet that needs improving.  The team can then focus their nutritional education on those areas.  If the patient scores 24-40, this means there are many ways they can  make their eating habits healthier, 41-57 states that there are some ways they can make their eating habits healthier and a score of 58-72 states that they are making many healthy choices.  The patient's goal is to achieve a score of 49 or higher on this assessment.  Brent Snow scored a 41 at entrance.  Oxygen Compliance: . Patient is currently on 0 liters at rest, 4 liters at night, and 2 liters for exercise.  Brent Snow is currently using a cpap at night.  The patient is currently compliant.   The patient states that they do not have barriers that keep them from using their oxygen.  Education: . Brent Snow will attend education classes during the course of Pulmonary Rehab.  Education classes that will be offered to the patient are Activities of Daily Living and Energy Conservation, Pursed Lip Breathing and Diaphragmatic Breathing, Nutrition, Exercise for the Pulmonary Patient, Warning Signs of Infection, Chronic Lung Disease, Advanced Directives, Medications, and Stress and Meditation.  The patient completed an assessment at the entrance of the program and will complete it again upon discharge to demonstrate the level of understanding provided by the educational classes.  This assessment includes 14 questions regarding all of the education topics above.  Brent Snow achieved a score of 12/14 at entrance.  Smoking Cessation:  N/A  Exercise:  Brent Snow will be provided with an individualized Home Exercise Prescription (HEP) at the entrance of the program.  The patient will be followed by the Pulmonary Exercise Physiologist throughout the program to assist with the progression of the frequency, intensity, time, and type of exercise. The patient's long-term goal is to be exercising 30-60 minutes, 3-5 days per week. At entrance, the patient was exercising 4 days at home.     Dr. Kate Sable _______________    Date______     Time___________ Medical Director

## 2014-11-13 ENCOUNTER — Encounter (HOSPITAL_COMMUNITY)
Admission: RE | Admit: 2014-11-13 | Discharge: 2014-11-13 | Disposition: A | Discharge status: Home / Self Care | Payer: Medicare Other | Source: Ambulatory Visit | Attending: Emergency Medicine | Admitting: Emergency Medicine

## 2014-11-13 DIAGNOSIS — J449 Chronic obstructive pulmonary disease, unspecified: Secondary | ICD-10-CM | POA: Diagnosis not present

## 2014-11-18 ENCOUNTER — Encounter (HOSPITAL_COMMUNITY): Payer: Medicare Other

## 2014-11-20 ENCOUNTER — Encounter (HOSPITAL_COMMUNITY): Payer: Medicare Other

## 2014-11-22 DIAGNOSIS — H578 Other specified disorders of eye and adnexa: Secondary | ICD-10-CM | POA: Diagnosis not present

## 2014-11-22 DIAGNOSIS — G44219 Episodic tension-type headache, not intractable: Secondary | ICD-10-CM | POA: Diagnosis not present

## 2014-11-25 ENCOUNTER — Encounter (HOSPITAL_COMMUNITY)
Admission: RE | Admit: 2014-11-25 | Discharge: 2014-11-25 | Disposition: A | Discharge status: Home / Self Care | Payer: Medicare Other | Source: Ambulatory Visit | Attending: Emergency Medicine | Admitting: Emergency Medicine

## 2014-11-25 DIAGNOSIS — J449 Chronic obstructive pulmonary disease, unspecified: Secondary | ICD-10-CM | POA: Diagnosis not present

## 2014-11-26 DIAGNOSIS — E1165 Type 2 diabetes mellitus with hyperglycemia: Secondary | ICD-10-CM | POA: Diagnosis not present

## 2014-11-26 DIAGNOSIS — I10 Essential (primary) hypertension: Secondary | ICD-10-CM | POA: Diagnosis not present

## 2014-11-27 ENCOUNTER — Encounter (HOSPITAL_COMMUNITY)
Admission: RE | Admit: 2014-11-27 | Discharge: 2014-11-27 | Disposition: A | Discharge status: Home / Self Care | Payer: Medicare Other | Source: Ambulatory Visit | Attending: Emergency Medicine | Admitting: Emergency Medicine

## 2014-11-27 DIAGNOSIS — J449 Chronic obstructive pulmonary disease, unspecified: Secondary | ICD-10-CM | POA: Diagnosis not present

## 2014-12-02 ENCOUNTER — Encounter (HOSPITAL_COMMUNITY): Payer: Medicare Other

## 2014-12-02 DIAGNOSIS — H04123 Dry eye syndrome of bilateral lacrimal glands: Secondary | ICD-10-CM | POA: Diagnosis not present

## 2014-12-03 ENCOUNTER — Encounter (HOSPITAL_COMMUNITY)
Admission: RE | Admit: 2014-12-03 | Discharge: 2014-12-03 | Disposition: A | Discharge status: Home / Self Care | Payer: Medicare Other | Source: Ambulatory Visit | Attending: Emergency Medicine | Admitting: Emergency Medicine

## 2014-12-03 DIAGNOSIS — J449 Chronic obstructive pulmonary disease, unspecified: Secondary | ICD-10-CM | POA: Diagnosis not present

## 2014-12-04 ENCOUNTER — Encounter (HOSPITAL_COMMUNITY): Payer: Medicare Other

## 2014-12-05 ENCOUNTER — Encounter (HOSPITAL_COMMUNITY)
Admission: RE | Admit: 2014-12-05 | Discharge: 2014-12-05 | Disposition: A | Discharge status: Home / Self Care | Payer: Medicare Other | Source: Ambulatory Visit | Attending: Emergency Medicine | Admitting: Emergency Medicine

## 2014-12-05 DIAGNOSIS — J449 Chronic obstructive pulmonary disease, unspecified: Secondary | ICD-10-CM | POA: Diagnosis not present

## 2014-12-09 ENCOUNTER — Encounter (HOSPITAL_COMMUNITY): Payer: Medicare Other

## 2014-12-10 ENCOUNTER — Encounter (HOSPITAL_COMMUNITY)
Admission: RE | Admit: 2014-12-10 | Discharge: 2014-12-10 | Disposition: A | Discharge status: Home / Self Care | Payer: Medicare Other | Source: Ambulatory Visit | Attending: Emergency Medicine | Admitting: Emergency Medicine

## 2014-12-10 DIAGNOSIS — J449 Chronic obstructive pulmonary disease, unspecified: Secondary | ICD-10-CM | POA: Diagnosis not present

## 2014-12-11 ENCOUNTER — Encounter (HOSPITAL_COMMUNITY): Payer: Medicare Other

## 2014-12-11 NOTE — Psychosocial Assessment (Signed)
Brent Snow 74 y.o. male  30 day Psychosocial Note  Patient psychosocial assessment reveals no barriers to participation in Pulmonary Rehab. Patient does continue to exhibit positive coping skills to deal with his psychosocial concerns. Offered emotional support and reassurance. Patient does feel he is making progress toward Pulmonary Rehab goals. Patient reports his health and activity level has improved in the past 30 days as evidenced by patient's report of increased ability to do activities longer. Patient states family/friends have noticed changes in his activity or mood. Patient reports feeling positive about current and projected progression in Pulmonary Rehab. After reviewing the patient's treatment plan, the patient is making progress toward Pulmonary Rehab goals. Patient's rate of progress toward rehab goals is fair. Plan of action to help patient continue to work towards rehab goals include staff encouragement. Will continue to monitor and evaluate progress toward psychosocial goal(s).  Goal(s) in progress: Help patient work toward returning to meaningful activities that improve patient's QOL and are attainable with patient's lung disease Do activities longer Lose weight

## 2014-12-11 NOTE — Progress Notes (Signed)
Brent Snow 74 y.o. male  30 day Psychosocial Note  Patient psychosocial assessment reveals no barriers to participation in Pulmonary Rehab. Patient does continue to exhibit positive coping skills to deal with his psychosocial concerns. Offered emotional support and reassurance. Patient does feel he is making progress toward Pulmonary Rehab goals. Patient reports his health and activity level has improved in the past 30 days as evidenced by patient's report of increased ability to do activities longer. Patient states family/friends have noticed changes in his activity or mood. Patient reports feeling positive about current and projected progression in Pulmonary Rehab. After reviewing the patient's treatment plan, the patient is making progress toward Pulmonary Rehab goals. Patient's rate of progress toward rehab goals is fair. Plan of action to help patient continue to work towards rehab goals include staff encouragement. Will continue to monitor and evaluate progress toward psychosocial goal(s).  Goal(s) in progress: Help patient work toward returning to meaningful activities that improve patient's QOL and are attainable with patient's lung disease Do activities longer Lose weight

## 2014-12-12 ENCOUNTER — Encounter (HOSPITAL_COMMUNITY)
Admission: RE | Admit: 2014-12-12 | Discharge: 2014-12-12 | Disposition: A | Discharge status: Home / Self Care | Payer: Medicare Other | Source: Ambulatory Visit | Attending: Emergency Medicine | Admitting: Emergency Medicine

## 2014-12-12 DIAGNOSIS — J449 Chronic obstructive pulmonary disease, unspecified: Secondary | ICD-10-CM | POA: Diagnosis not present

## 2014-12-16 ENCOUNTER — Encounter (HOSPITAL_COMMUNITY): Payer: Medicare Other

## 2014-12-17 ENCOUNTER — Encounter (HOSPITAL_COMMUNITY)
Admission: RE | Admit: 2014-12-17 | Discharge: 2014-12-17 | Disposition: A | Discharge status: Home / Self Care | Payer: Medicare Other | Source: Ambulatory Visit | Attending: Emergency Medicine | Admitting: Emergency Medicine

## 2014-12-17 DIAGNOSIS — J449 Chronic obstructive pulmonary disease, unspecified: Secondary | ICD-10-CM | POA: Diagnosis not present

## 2014-12-18 ENCOUNTER — Encounter (HOSPITAL_COMMUNITY): Payer: Medicare Other

## 2014-12-19 ENCOUNTER — Encounter (HOSPITAL_COMMUNITY)
Admission: RE | Admit: 2014-12-19 | Discharge: 2014-12-19 | Disposition: A | Discharge status: Home / Self Care | Payer: Medicare Other | Source: Ambulatory Visit | Attending: Emergency Medicine | Admitting: Emergency Medicine

## 2014-12-19 DIAGNOSIS — J449 Chronic obstructive pulmonary disease, unspecified: Secondary | ICD-10-CM | POA: Diagnosis not present

## 2014-12-23 ENCOUNTER — Encounter (HOSPITAL_COMMUNITY): Payer: Medicare Other

## 2014-12-24 ENCOUNTER — Encounter (HOSPITAL_COMMUNITY): Payer: Medicare Other

## 2014-12-25 ENCOUNTER — Encounter (HOSPITAL_COMMUNITY): Payer: Medicare Other

## 2014-12-26 ENCOUNTER — Encounter (HOSPITAL_COMMUNITY): Payer: Medicare Other

## 2014-12-30 ENCOUNTER — Encounter (HOSPITAL_COMMUNITY): Payer: Medicare Other

## 2014-12-31 ENCOUNTER — Encounter (HOSPITAL_COMMUNITY)
Admission: RE | Admit: 2014-12-31 | Discharge: 2014-12-31 | Disposition: A | Discharge status: Home / Self Care | Payer: Medicare Other | Source: Ambulatory Visit | Attending: Emergency Medicine | Admitting: Emergency Medicine

## 2014-12-31 DIAGNOSIS — J449 Chronic obstructive pulmonary disease, unspecified: Secondary | ICD-10-CM | POA: Diagnosis not present

## 2015-01-01 ENCOUNTER — Encounter (HOSPITAL_COMMUNITY): Payer: Medicare Other

## 2015-01-02 ENCOUNTER — Encounter (HOSPITAL_COMMUNITY)
Admission: RE | Admit: 2015-01-02 | Discharge: 2015-01-02 | Disposition: A | Discharge status: Home / Self Care | Payer: Medicare Other | Source: Ambulatory Visit | Attending: Emergency Medicine | Admitting: Emergency Medicine

## 2015-01-02 DIAGNOSIS — J449 Chronic obstructive pulmonary disease, unspecified: Secondary | ICD-10-CM | POA: Diagnosis not present

## 2015-01-06 ENCOUNTER — Encounter (HOSPITAL_COMMUNITY): Payer: Medicare Other

## 2015-01-07 ENCOUNTER — Encounter (HOSPITAL_COMMUNITY)
Admission: RE | Admit: 2015-01-07 | Discharge: 2015-01-07 | Disposition: A | Discharge status: Home / Self Care | Payer: Medicare Other | Source: Ambulatory Visit | Attending: Emergency Medicine | Admitting: Emergency Medicine

## 2015-01-07 DIAGNOSIS — J449 Chronic obstructive pulmonary disease, unspecified: Secondary | ICD-10-CM | POA: Insufficient documentation

## 2015-01-08 ENCOUNTER — Encounter (HOSPITAL_COMMUNITY): Payer: Medicare Other

## 2015-01-09 ENCOUNTER — Encounter (HOSPITAL_COMMUNITY): Payer: Medicare Other

## 2015-01-13 ENCOUNTER — Encounter (HOSPITAL_COMMUNITY): Payer: Medicare Other

## 2015-01-14 ENCOUNTER — Encounter (HOSPITAL_COMMUNITY)
Admission: RE | Admit: 2015-01-14 | Discharge: 2015-01-14 | Disposition: A | Discharge status: Home / Self Care | Payer: Medicare Other | Source: Ambulatory Visit | Attending: Emergency Medicine | Admitting: Emergency Medicine

## 2015-01-14 DIAGNOSIS — J449 Chronic obstructive pulmonary disease, unspecified: Secondary | ICD-10-CM | POA: Diagnosis not present

## 2015-01-15 ENCOUNTER — Encounter (HOSPITAL_COMMUNITY): Payer: Medicare Other

## 2015-01-16 ENCOUNTER — Encounter (HOSPITAL_COMMUNITY)
Admission: RE | Admit: 2015-01-16 | Discharge: 2015-01-16 | Disposition: A | Discharge status: Home / Self Care | Payer: Medicare Other | Source: Ambulatory Visit | Attending: Emergency Medicine | Admitting: Emergency Medicine

## 2015-01-16 DIAGNOSIS — J449 Chronic obstructive pulmonary disease, unspecified: Secondary | ICD-10-CM | POA: Diagnosis not present

## 2015-01-17 NOTE — Psychosocial Assessment (Signed)
Brent Snow 74 y.o. male  30 day Psychosocial Note  Patient psychosocial assessment reveals no barriers to participation in Pulmonary Rehab. Patient does continue to exhibit positive coping skills to deal with his psychosocial concerns. Offered emotional support and reassurance. Patient does feel he is making progress toward Pulmonary Rehab goals. Patient reports his health and activity level has improved in the past 30 days as evidenced by patient's report of increased ability to do activities longer and breath easier. Patient states family/friends have noticed changes in his activity or mood. Patient reports feeling positive about current and projected progression in Pulmonary Rehab. After reviewing the patient's treatment plan, the patient is making progress toward Pulmonary Rehab goals. Patient's rate of progress toward rehab goals is good. Plan of action to help patient continue to work towards rehab goals include staff encouragement. Will continue to monitor and evaluate progress toward psychosocial goal(s).  Goal(s) in progress: Help patient work toward returning to meaningful activities that improve patient's QOL and are attainable with patient's lung disease Lose weight Do activities longer

## 2015-01-20 ENCOUNTER — Encounter (HOSPITAL_COMMUNITY): Payer: Medicare Other

## 2015-01-21 ENCOUNTER — Encounter (HOSPITAL_COMMUNITY)
Admission: RE | Admit: 2015-01-21 | Discharge: 2015-01-21 | Disposition: A | Discharge status: Home / Self Care | Payer: Medicare Other | Source: Ambulatory Visit | Attending: Emergency Medicine | Admitting: Emergency Medicine

## 2015-01-21 DIAGNOSIS — J449 Chronic obstructive pulmonary disease, unspecified: Secondary | ICD-10-CM | POA: Diagnosis not present

## 2015-01-22 ENCOUNTER — Encounter (HOSPITAL_COMMUNITY): Payer: Medicare Other

## 2015-01-23 ENCOUNTER — Encounter (HOSPITAL_COMMUNITY): Payer: Medicare Other

## 2015-01-27 ENCOUNTER — Encounter (HOSPITAL_COMMUNITY): Payer: Medicare Other

## 2015-01-28 ENCOUNTER — Encounter (HOSPITAL_COMMUNITY)
Admission: RE | Admit: 2015-01-28 | Discharge: 2015-01-28 | Disposition: A | Discharge status: Home / Self Care | Payer: Medicare Other | Source: Ambulatory Visit | Attending: Emergency Medicine | Admitting: Emergency Medicine

## 2015-01-28 DIAGNOSIS — J449 Chronic obstructive pulmonary disease, unspecified: Secondary | ICD-10-CM | POA: Diagnosis not present

## 2015-01-29 ENCOUNTER — Encounter (HOSPITAL_COMMUNITY): Payer: Medicare Other

## 2015-01-30 ENCOUNTER — Encounter (HOSPITAL_COMMUNITY)
Admission: RE | Admit: 2015-01-30 | Discharge: 2015-01-30 | Disposition: A | Discharge status: Home / Self Care | Payer: Medicare Other | Source: Ambulatory Visit | Attending: Emergency Medicine | Admitting: Emergency Medicine

## 2015-01-30 DIAGNOSIS — J449 Chronic obstructive pulmonary disease, unspecified: Secondary | ICD-10-CM | POA: Diagnosis not present

## 2015-01-30 NOTE — Psychosocial Assessment (Signed)
Keyen Marban Walther 74 y.o. male  59 day Psychosocial Note  Patient psychosocial assessment reveals no barriers to participation in Pulmonary Rehab. Patient does continue to exhibit positive coping skills to deal with his psychosocial concerns. Offered emotional support and reassurance. Patient does feel he is making progress toward Pulmonary Rehab goals. Patient reports his health and activity level has improved in the past 30 days as evidenced by patient's report of increased ability to do activities longer. Patient states family/friends have noticed changes in his activity or mood. Patient reports feeling positive about current and projected progression in Pulmonary Rehab. After reviewing the patient's treatment plan, the patient is making progress toward Pulmonary Rehab goals. Patient's rate of progress toward rehab goals is good. Plan of action to help patient continue to work towards rehab goals include staff encouragement. Will continue to monitor and evaluate progress toward psychosocial goal(s).  Goal(s) in progress: Help patient work toward returning to meaningful activities that improve patient's QOL and are attainable with patient's lung disease Lose weight Do activities longer.

## 2015-01-30 NOTE — Progress Notes (Signed)
Pulmonary Rehabilitation Program Outcomes Report   Orientation:  11/08/14 Graduate Date:  tbd Discharge Date:  tbd # of sessions completed: 56  Pulmonologist: Dr. Lamonte Sakai Family MD:  Dr. Lorinda Creed Time:  1330  A.  Exercise Program:  Tolerates exercise @ 3.71 METS for 30 minutes  B.  Mental Health:  Good mental attitude  C.  Education/Instruction/Skills  Accurately checks own pulse.  Rest:  81  Exercise:  97, Knows THR for exercise and Uses Perceived Exertion Scale and/or Dyspnea Scale  Uses Perceived Exertion Scale and/or Dyspnea Scale  D.  Nutrition/Weight Control/Body Composition:  Adherence to prescribed nutrition program: good    E.  Blood Lipids   No results found for: CHOL, HDL, LDLCALC, LDLDIRECT, TRIG, CHOLHDL  F.  Lifestyle Changes:  Making positive lifestyle changes  G.  Symptoms noted with exercise:  Asymptomatic  Report Completed By:  Felicity Coyer, RN   Comments:  Halfway note

## 2015-02-03 ENCOUNTER — Encounter (HOSPITAL_COMMUNITY): Payer: Medicare Other

## 2015-02-04 ENCOUNTER — Encounter (HOSPITAL_COMMUNITY)
Admission: RE | Admit: 2015-02-04 | Discharge: 2015-02-04 | Disposition: A | Discharge status: Home / Self Care | Payer: Medicare Other | Source: Ambulatory Visit | Attending: Emergency Medicine | Admitting: Emergency Medicine

## 2015-02-04 DIAGNOSIS — J449 Chronic obstructive pulmonary disease, unspecified: Secondary | ICD-10-CM | POA: Insufficient documentation

## 2015-02-05 ENCOUNTER — Encounter (HOSPITAL_COMMUNITY): Payer: Medicare Other

## 2015-02-06 ENCOUNTER — Encounter (HOSPITAL_COMMUNITY)
Admission: RE | Admit: 2015-02-06 | Discharge: 2015-02-06 | Disposition: A | Discharge status: Home / Self Care | Payer: Medicare Other | Source: Ambulatory Visit | Attending: Emergency Medicine | Admitting: Emergency Medicine

## 2015-02-06 DIAGNOSIS — J449 Chronic obstructive pulmonary disease, unspecified: Secondary | ICD-10-CM | POA: Diagnosis not present

## 2015-02-10 ENCOUNTER — Encounter (HOSPITAL_COMMUNITY): Payer: Medicare Other

## 2015-02-11 ENCOUNTER — Encounter (HOSPITAL_COMMUNITY)
Admission: RE | Admit: 2015-02-11 | Discharge: 2015-02-11 | Disposition: A | Discharge status: Home / Self Care | Payer: Medicare Other | Source: Ambulatory Visit | Attending: Emergency Medicine | Admitting: Emergency Medicine

## 2015-02-11 DIAGNOSIS — J449 Chronic obstructive pulmonary disease, unspecified: Secondary | ICD-10-CM | POA: Diagnosis not present

## 2015-02-12 ENCOUNTER — Encounter (HOSPITAL_COMMUNITY): Payer: Medicare Other

## 2015-02-13 ENCOUNTER — Encounter (HOSPITAL_COMMUNITY)
Admission: RE | Admit: 2015-02-13 | Discharge: 2015-02-13 | Disposition: A | Discharge status: Home / Self Care | Payer: Medicare Other | Source: Ambulatory Visit | Attending: Emergency Medicine | Admitting: Emergency Medicine

## 2015-02-13 DIAGNOSIS — J449 Chronic obstructive pulmonary disease, unspecified: Secondary | ICD-10-CM | POA: Diagnosis not present

## 2015-02-17 ENCOUNTER — Encounter (HOSPITAL_COMMUNITY): Payer: Medicare Other

## 2015-02-18 ENCOUNTER — Encounter (HOSPITAL_COMMUNITY)
Admission: RE | Admit: 2015-02-18 | Discharge: 2015-02-18 | Disposition: A | Discharge status: Home / Self Care | Payer: Medicare Other | Source: Ambulatory Visit | Attending: Emergency Medicine | Admitting: Emergency Medicine

## 2015-02-18 DIAGNOSIS — J449 Chronic obstructive pulmonary disease, unspecified: Secondary | ICD-10-CM | POA: Diagnosis not present

## 2015-02-19 ENCOUNTER — Encounter (HOSPITAL_COMMUNITY): Payer: Medicare Other

## 2015-02-20 ENCOUNTER — Encounter (HOSPITAL_COMMUNITY)
Admission: RE | Admit: 2015-02-20 | Discharge: 2015-02-20 | Disposition: A | Discharge status: Home / Self Care | Payer: Medicare Other | Source: Ambulatory Visit | Attending: Emergency Medicine | Admitting: Emergency Medicine

## 2015-02-20 DIAGNOSIS — J449 Chronic obstructive pulmonary disease, unspecified: Secondary | ICD-10-CM | POA: Diagnosis not present

## 2015-02-20 NOTE — Progress Notes (Signed)
[facility] Pulmonary Rehabilitation                                                             Final/Discharge Outcome Results  Anthropometrics: . Height (inches): 64 . Weight (kg): 110.9 ? This is a change of -0.6 from entrance. . Grip strength was measured using a Dynamometer.  The patient's discharge score was a 83.3.   ? This is a change of 4.1% from entrance.  Functional Status/Exercise Capacity: . Brent Snow had a resting heart rate of 78 BPM, a resting blood pressure of 130/70, and an oxygen saturation of 90 % on 0 liters of O2.  Brent Snow performed a discharge 6-minute walk test on 02/18/15.  The patient completed 1050 feet in 6 minutes with 0 rest breaks.  This quantifies 2.53 METS. The patient's goal is to add 82 feet onto the baseline 6MWT.   ? The patient increased their 6-minute walk test distance by 150 feet and their MET level by 0.23 METs.  Dyspnea Measures: . The [facility] is a simple and standardized method of classifying disability in patients with COPD.  The assessment correlates disability and dyspnea.  Upon discharge the patients resting score was 3. The scale is provided below.  ? This is a change of -25% from entrance.  0= I only get breathless with strenuous exercise. 1= I get short of breath when hurrying on level ground or walking up a slight incline. 2= On level ground, I walk slower than people of the same age because of breathlessness, or have to stop for breath when walking at my own pace. 3= I stop for breath after walking 100 yards or after a few minutes on level ground. 4=I am too breathless to leave the house or I am breathless when dressing.   . The patient completed the [place] ([facility] [place]).  This questionnaire relates activities of daily living and shortness of breath.  The score ranges from 0-120, a higher score relates to severe shortness of breath during  activities of daily living. The patient's score at discharge was 53. ? This is a change of -11.7% from entrance.  Quality of Life: . Ferrans and Powers Quality of Life Index Pulmonary Version is used to assess the patients satisfaction in different domains of their life; health and functioning, socioeconomic, psychological/spiritual, and family. The overall score is recorded out of 30 points.  The patient's goal is to achieve an overall score of 21 or higher.  Brent Snow received a 21.56 upon discharge.  ? This is a change of 4.65% from entrance.  . The Patient Health Questionnaire (PHQ-2) is a first step approach for the screening of depression.  If the patient scores positive on the PHQ-2 the patient should be further assessed with the PHQ-9.  The Patient Health Questionnaire (PHQ-9) assesses the degree of depression.  Depression is important to monitor and track in pulmonary patients due to its prevalence in the population.  If the patient advances to the PHQ-9 the goal is to score less than 4 on this assessment.  Brent Snow scored a 0 at discharge. ? This is a change of 0 from entrance.   Clinical Assessment Tools: . The COPD Assessment Test (CAT) is a measurement tool to quantify how  much of an impact the disease has on the patient's life.  This assessment aids the Pulmonary Rehab Team in designing the patients individualized treatment plan.  A CAT score ranges from 0-40.  A score of 10 or below indicates that COPD has a low impact on the patient's life whereas a score of 30 or higher indicates a severe impact. The patient's goal is a decrease of 1 point from entrance to discharge.  Brent Snow had a CAT score of 18 upon discharge.   ? This is a change of 38.5% from entrance.  Nutrition: . The "Rate My Plate" is a dietary assessment that quantifies the balance of a patient's diet.  This tool allows the Pulmonary Rehab Team to key in on the areas of the patient's diet that needs improving.  The team can then  focus their nutritional education on those areas.  If the patient scores 24-40, this means there are many ways they can make their eating habits healthier, 41-57 states that there are some ways they can make their eating habits healthier and a score of 58-72 states that they are making many healthy choices.  The patient's goal is to achieve a score of 49 or higher on this assessment.  Brent Snow scored a 41 upon discharge.   ? This is a change of 0% from entrance.  Oxygen Compliance: . Patient is currently on 0 liters at rest, 2.5 liters at night, and 2.5 liters for exercise.  Brent Snow is currently using a cpap at night.  The patient is currently noncompliant. Patient states that he does not wear his cpap every night.  The patient states that they do not have barriers that keep them from using their oxygen. ? This is a negative/no change result from entrance.    Education: . Brent Snow attended 13/13 education classes.  Brent Snow completed a discharge educational assessment and achieved a score of 12/14.  ? This is a change of 0% from entrance.   Smoking Cessation:  N/A  Exercise: . Brent Snow was provided with an individualized Home Exercise Prescription (HEP) at the entrance of the program.  The patient's goal is to be exercising 30-60 minutes, 3-5 days per week. Upon discharge the patient is exercising 5 days at home.  This is a change of 66.7% from entrance.  After graduation from Pulmonary Rehab, Brent Snow will continue exercising at home.

## 2015-02-20 NOTE — Progress Notes (Signed)
Patient is discharged from [place] and Pulmonary program today, March, 17, 2016 with 24 sessions.  He achieved LTG of 30 minutes of aerobic exercise at max met level of 3.71.  All patient vitals are WNL.  Patient has met with dietician.  Discharge instructions have been reviewed in detail and patient expressed an understanding of material given.  Patient plans to exercise at home and possibly join the maintenance program. Cardiac Rehab will make 1 month, 6 month and 1 year call backs.  Patient had no complaints of any abnormal S/S or pain on their exit visit.

## 2015-02-24 ENCOUNTER — Encounter (HOSPITAL_COMMUNITY): Payer: Medicare Other

## 2015-02-25 ENCOUNTER — Encounter (HOSPITAL_COMMUNITY): Payer: Medicare Other

## 2015-02-26 ENCOUNTER — Encounter (HOSPITAL_COMMUNITY): Payer: Medicare Other

## 2015-02-27 ENCOUNTER — Encounter (HOSPITAL_COMMUNITY): Payer: Medicare Other

## 2015-03-03 ENCOUNTER — Encounter (HOSPITAL_COMMUNITY): Payer: Medicare Other

## 2015-03-04 ENCOUNTER — Encounter (HOSPITAL_COMMUNITY): Payer: Medicare Other

## 2015-03-04 DIAGNOSIS — H43393 Other vitreous opacities, bilateral: Secondary | ICD-10-CM | POA: Diagnosis not present

## 2015-03-05 ENCOUNTER — Encounter (HOSPITAL_COMMUNITY): Payer: Medicare Other

## 2015-03-06 ENCOUNTER — Encounter (HOSPITAL_COMMUNITY): Payer: Medicare Other

## 2015-03-07 ENCOUNTER — Telehealth: Payer: Self-pay | Admitting: Emergency Medicine

## 2015-03-07 NOTE — Telephone Encounter (Signed)
Spoke with pt's wife, states that [facility] called them today stating that pt needed a face to face with a provider to requalify for his 02 before 04/03/15.  I advised pt's wife that [facility] did not have any openings between then and now but TP could see them.  Pt scheduled for 4/8 at 3:00.  Pt will need an 02 qualifying walk.  Nothing further needed at this time.

## 2015-03-10 ENCOUNTER — Encounter (HOSPITAL_COMMUNITY): Payer: Medicare Other

## 2015-03-11 ENCOUNTER — Encounter (HOSPITAL_COMMUNITY): Payer: Medicare Other

## 2015-03-12 ENCOUNTER — Encounter (HOSPITAL_COMMUNITY): Payer: Medicare Other

## 2015-03-13 ENCOUNTER — Encounter (HOSPITAL_COMMUNITY): Payer: Medicare Other

## 2015-03-14 ENCOUNTER — Ambulatory Visit (INDEPENDENT_AMBULATORY_CARE_PROVIDER_SITE_OTHER)
Admission: RE | Admit: 2015-03-14 | Discharge: 2015-03-14 | Disposition: A | Discharge status: Home / Self Care | Payer: Medicare Other | Source: Ambulatory Visit | Attending: Adult Health | Admitting: Adult Health

## 2015-03-14 ENCOUNTER — Encounter: Payer: Self-pay | Admitting: Adult Health

## 2015-03-14 ENCOUNTER — Ambulatory Visit (INDEPENDENT_AMBULATORY_CARE_PROVIDER_SITE_OTHER): Payer: Medicare Other | Admitting: Adult Health

## 2015-03-14 VITALS — BP 128/66 | HR 72 | Temp 98.3°F | Ht 66.0 in | Wt 244.0 lb

## 2015-03-14 DIAGNOSIS — G4733 Obstructive sleep apnea (adult) (pediatric): Secondary | ICD-10-CM

## 2015-03-14 DIAGNOSIS — J449 Chronic obstructive pulmonary disease, unspecified: Secondary | ICD-10-CM | POA: Diagnosis not present

## 2015-03-14 MED ORDER — ALBUTEROL SULFATE HFA 108 (90 BASE) MCG/ACT IN AERS: 2.0000 | INHALATION_SPRAY | RESPIRATORY_TRACT | Status: DC | PRN

## 2015-03-14 NOTE — Addendum Note (Signed)
Addended by: Osa Craver on: 03/14/2015 03:43 PM   Modules accepted: Orders

## 2015-03-14 NOTE — Addendum Note (Signed)
Addended by: Osa Craver on: 03/14/2015 03:57 PM   Modules accepted: Orders

## 2015-03-14 NOTE — Assessment & Plan Note (Addendum)
Compensated on CPAP  Plan  Continue on ANORO daily Chest x-ray today Continue on C Pap at bedtime Wear oxygen with activity. Continue to work on weight loss Do not drive if sleepy Follow-up with Dr. Lamonte Sakai in 6 months and as needed

## 2015-03-14 NOTE — Assessment & Plan Note (Addendum)
Compensated on present regimen  Plan Continue on [facility] daily Chest x-ray today Continue on C Pap at bedtime Wear oxygen with activity. Continue to work on weight loss Do not drive if sleepy Follow-up with Dr. Lamonte Sakai in 6 months and as needed

## 2015-03-14 NOTE — Patient Instructions (Signed)
Continue on ANORO daily Chest x-ray today Continue on C Pap at bedtime Wear oxygen with activity. Continue to work on weight loss Do not drive if sleepy Follow-up with Dr. Lamonte Sakai in 6 months and as needed

## 2015-03-14 NOTE — Progress Notes (Signed)
Subjective:    Patient ID: Brent Snow, male    DOB: Nov 05, 1941, 74 y.o.   MRN: [id number]  HPI 74 yo former smoker, HTN, OSA, DM.  Last seen by me in 2010 for COPD.  He never really felt that Spiriva did much, he remains on Symbicort. He has not had any flares for 5 years. He has noticed more SOB, bothers him with mild exertion, walking. He occasionally hears wheeze. Rare cough. Minimal mucous.   He wears CPAP rarely. Doesn't average once a week. [facility] is provider.    ROV 72/22/15 -- follows for COPD, OSA/OHS. He has been wearing auto-set device for 3 weeks, is due to have data interpreted tomorrow.  He is now on Anoro, feels that he did not lose any ground off symbicort but not necessarily better.   ROV 06/20/14 -- follows for COPD, OSA/OHS, exertional hypoxemia. He has been doing well. He is reliable with his CPAP + O2 bled in.  He has [employer] and is interested in a Marine scientist. His breathing is better since he started using o2 more reliably. He is using albuterol 2 x a day. He is on Anoro qd. He needs full PFT in order to get into pulm rehab.   ROV 10/24/14 -- follow-up visit for COPD, obstructive sleep apnea and exertional hypoxemia. He wears CPAP reliably. Underwent PFT 07/13/14 , FEV1 1.26 (46%). He is scheduled to go to pulm rehab at [facility] starting 12/4.  Has been doing well since last time with some good and bad days. He has some occasional wheeze. He wears his O2 prn - whenever he feels SOB, has to walk an incline. No flares since last time.   03/14/2015 Follow up COPD /OSA and exertional Hypoxemia  Has severe COPD on ANORO.  Did  pulmonary rehab at [facility]. Liked it, finihsed course.  Has good and bad days. Has DOE, wears O2 with act.  O2 sats walking , O2 91% on RA at rest.  OSA on CPAP At bedtime  , wears 6 hrs most nights, no sign daytime sleepiness.  CPAP really helps.  He denies any chest pain, hemoptysis, orthopnea, PND, or unintentional weight loss. PVX and Prevnar  utd. Declines flu shot.  Last CT 2014 w/ chronic changes -no sign changes.   Review of Systems  Constitutional:   No  weight loss, night sweats,  Fevers, chills,  +fatigue, or  lassitude.  HEENT:   No headaches,  Difficulty swallowing,  Tooth/dental problems, or  Sore throat,                No sneezing, itching, ear ache, nasal congestion, post nasal drip,   CV:  No chest pain,  Orthopnea, PND, swelling in lower extremities, anasarca, dizziness, palpitations, syncope.   GI  No heartburn, indigestion, abdominal pain, nausea, vomiting, diarrhea, change in bowel habits, loss of appetite, bloody stools.   Resp:   No chest wall deformity  Skin: no rash or lesions.  GU: no dysuria, change in color of urine, no urgency or frequency.  No flank pain, no hematuria   MS:  No joint pain or swelling.  No decreased range of motion.  No back pain.  Psych:  No change in mood or affect. No depression or anxiety.  No memory loss.          Objective:   Physical Exam  Gen: Pleasant, overweight, in no distress,  normal affect  ENT: No lesions,  mouth clear,  oropharynx clear, no  postnasal drip  Neck: No JVD, no TMG, no carotid bruits  Lungs: No use of accessory muscles, clear without rales or rhonchi  Cardiovascular: RRR, heart sounds normal, no murmur or gallops, no peripheral edema  Musculoskeletal: No deformities, no cyanosis or clubbing  Neuro: alert, non focal  Skin: Warm, no lesions or rashes     Assessment & Plan:

## 2015-03-18 ENCOUNTER — Encounter (HOSPITAL_COMMUNITY): Payer: Medicare Other

## 2015-03-19 NOTE — Progress Notes (Signed)
Quick Note:  Called spoke with patient, advised of cxr results / recs as stated by TP. Pt verbalized his understanding and denied any questions. ______ 

## 2015-03-20 ENCOUNTER — Encounter (HOSPITAL_COMMUNITY): Payer: Medicare Other

## 2015-06-20 ENCOUNTER — Other Ambulatory Visit: Payer: Self-pay | Admitting: *Deleted

## 2015-06-20 MED ORDER — UMECLIDINIUM-VILANTEROL 62.5-25 MCG/INH IN AEPB: 1.0000 | INHALATION_SPRAY | Freq: Every day | RESPIRATORY_TRACT | Status: DC

## 2015-07-20 IMAGING — CR DG CHEST 2V
2 series · 2 of 2 positions shown · non-contrast
Comparison: Chest x-ray of March 09, 2012

CLINICAL DATA: COPD and hypertension and previous tobacco use,
morbid obesity, currently asymptomatic.

EXAM:
CHEST  2 VIEW

[view not recorded (1 of 2)]
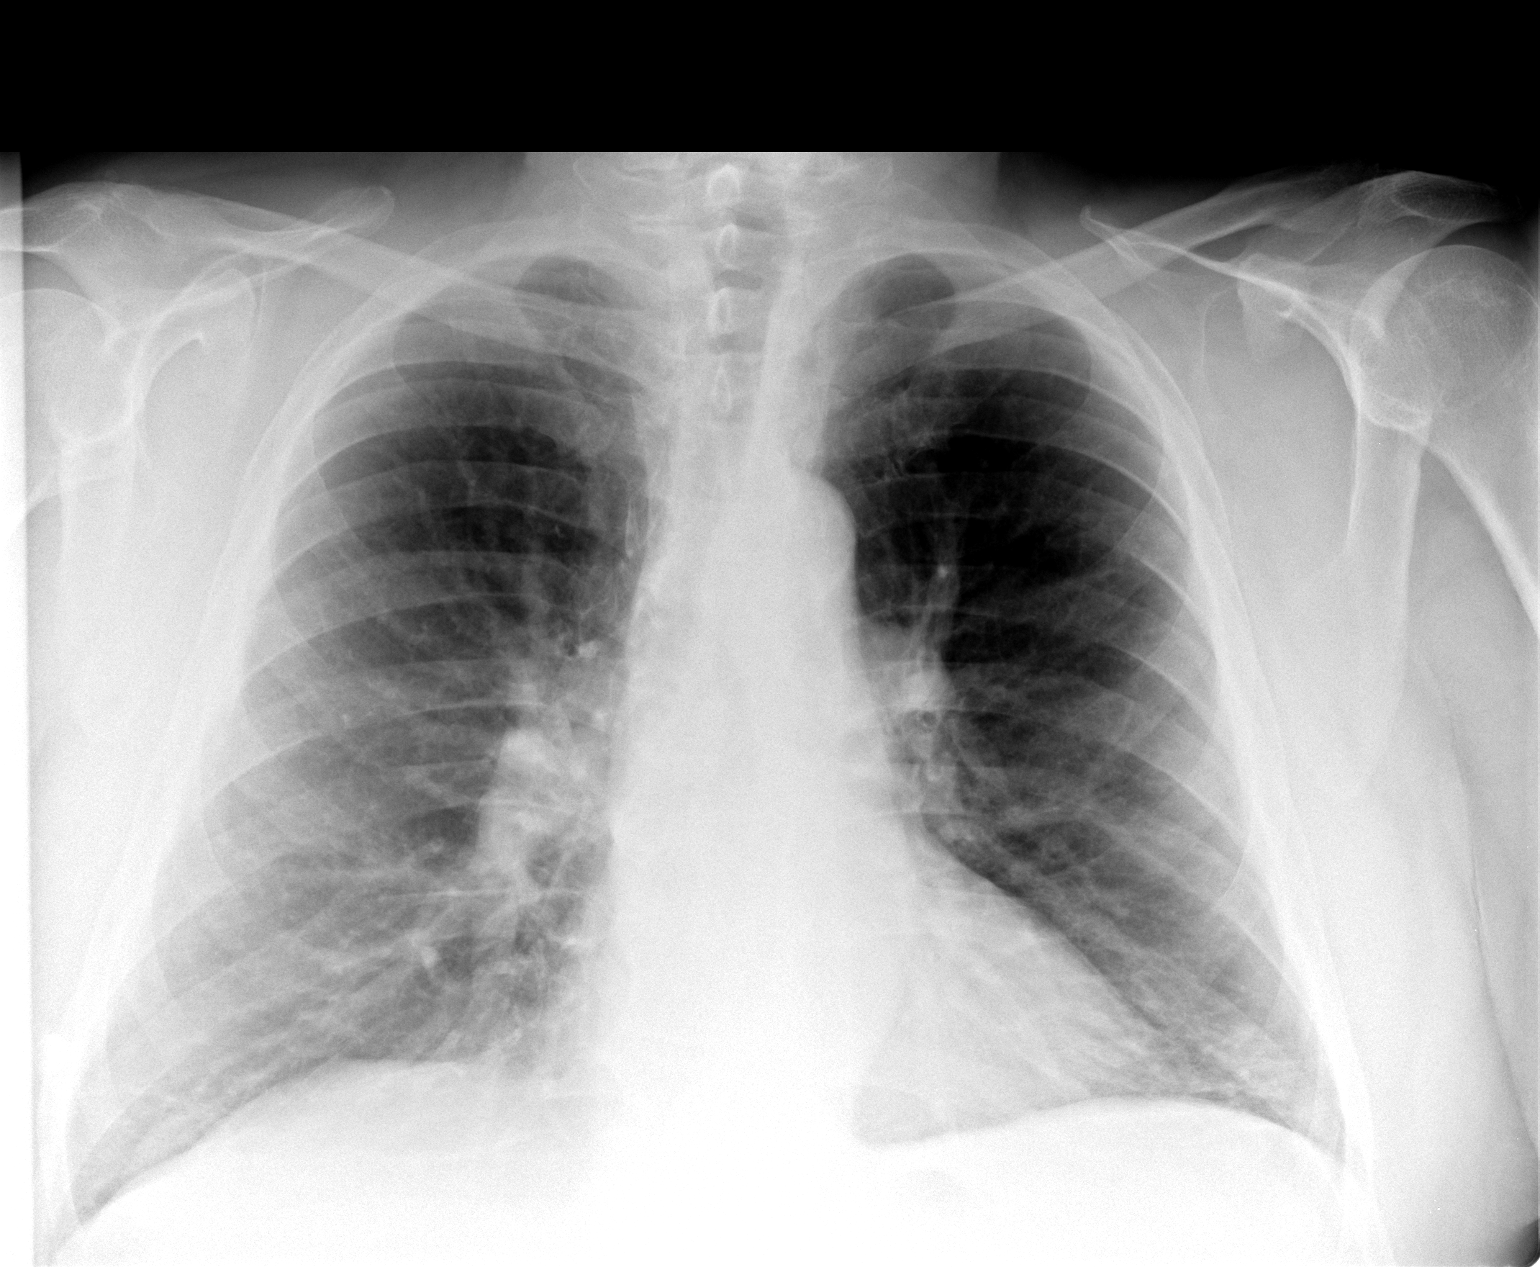

[view not recorded (2 of 2)]
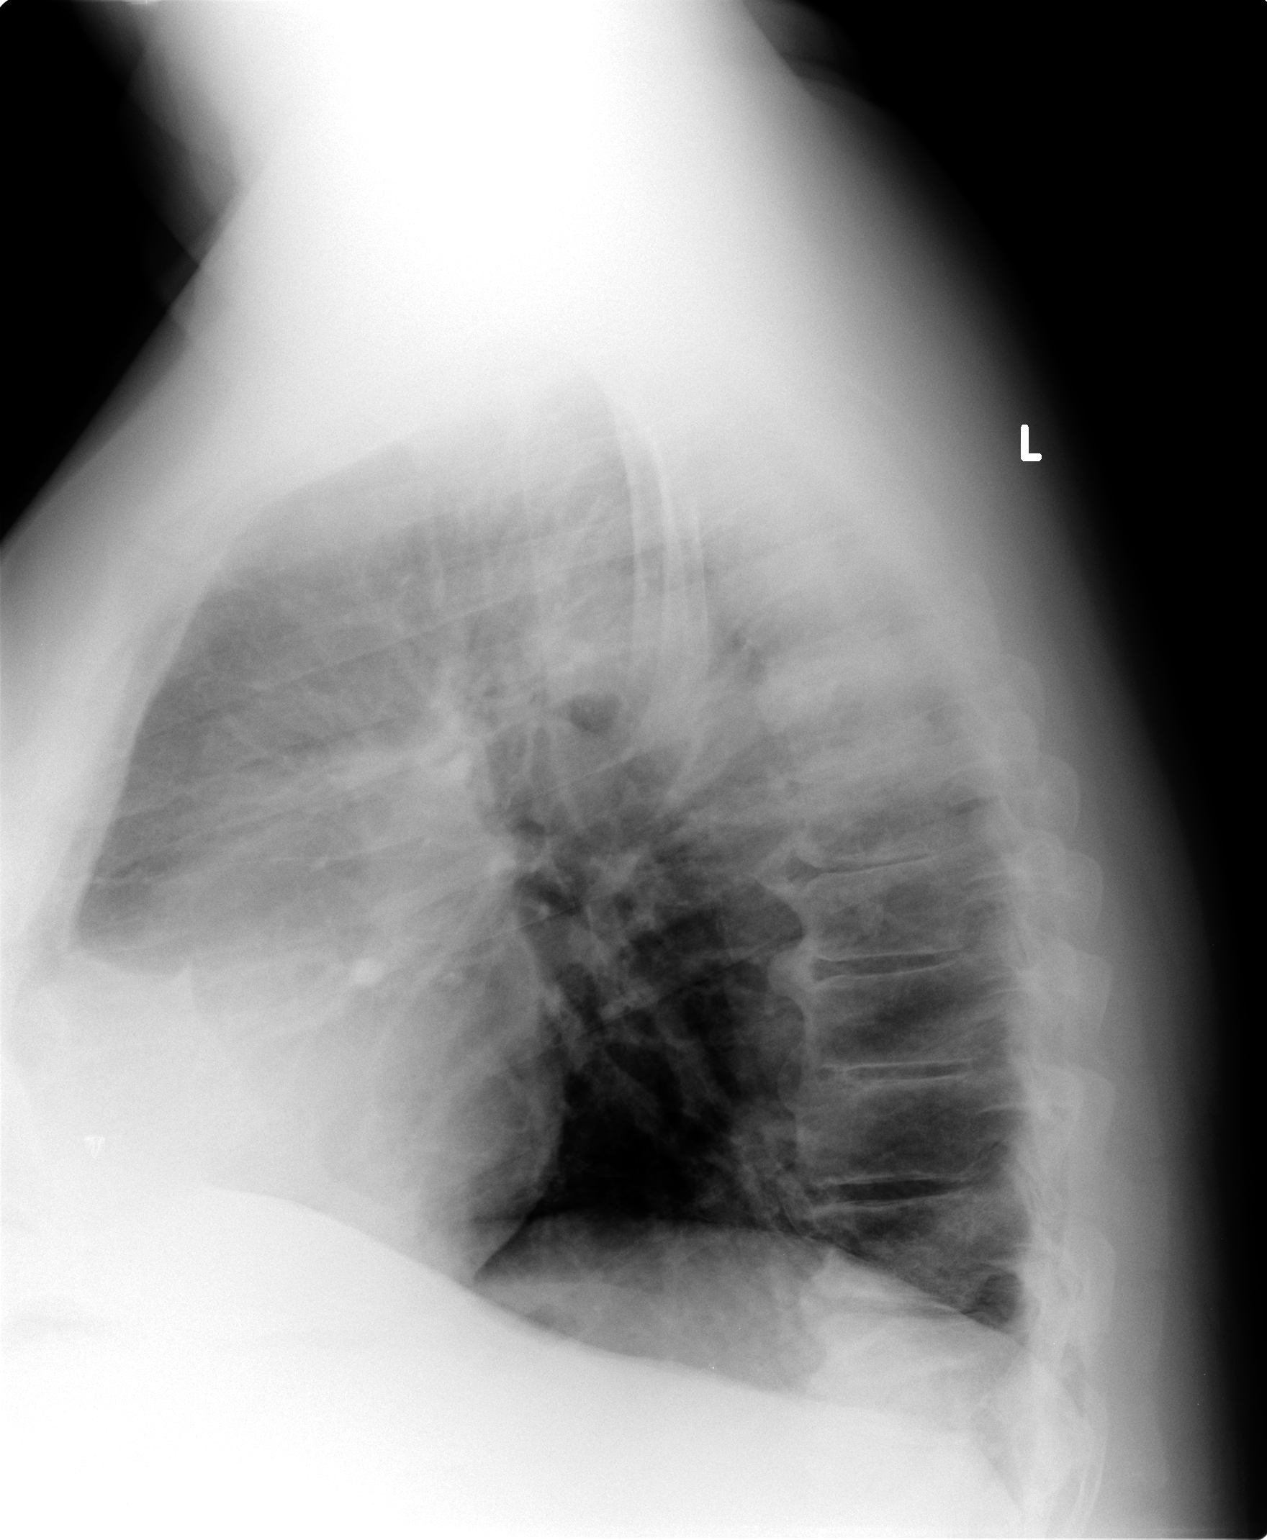

[2 of 2 positions shown; findings below may reference images not displayed]

FINDINGS: The lungs are adequately inflated. The interstitial markings are
coarse but stable. The heart and pulmonary vascularity are normal.
There is mild tortuosity of the descending thoracic aorta. There is
no pleural effusion. There is multilevel degenerative disc disease
of the thoracic spine with calcification of the anterior
longitudinal ligament.
IMPRESSION: COPD with stable chronic bronchitic change. There is no active
cardiopulmonary disease.

## 2015-08-14 DIAGNOSIS — E1165 Type 2 diabetes mellitus with hyperglycemia: Secondary | ICD-10-CM | POA: Diagnosis not present

## 2015-08-14 DIAGNOSIS — I10 Essential (primary) hypertension: Secondary | ICD-10-CM | POA: Diagnosis not present

## 2015-09-02 ENCOUNTER — Ambulatory Visit (INDEPENDENT_AMBULATORY_CARE_PROVIDER_SITE_OTHER): Payer: Medicare Other | Admitting: Emergency Medicine

## 2015-09-02 ENCOUNTER — Encounter: Payer: Self-pay | Admitting: Emergency Medicine

## 2015-09-02 VITALS — BP 148/60 | HR 83 | Temp 98.6°F | Ht 66.0 in | Wt 226.0 lb

## 2015-09-02 DIAGNOSIS — J449 Chronic obstructive pulmonary disease, unspecified: Secondary | ICD-10-CM

## 2015-09-02 DIAGNOSIS — G4733 Obstructive sleep apnea (adult) (pediatric): Secondary | ICD-10-CM | POA: Diagnosis not present

## 2015-09-02 DIAGNOSIS — J9611 Chronic respiratory failure with hypoxia: Secondary | ICD-10-CM

## 2015-09-02 DIAGNOSIS — R0600 Dyspnea, unspecified: Secondary | ICD-10-CM | POA: Diagnosis not present

## 2015-09-02 NOTE — Assessment & Plan Note (Signed)
You need to wear your oxygen at all times at 2.5 - 3L/min. The goal is to keep your SpO2 > 90%. This will help your breathing and will help [place] prevent new problems with your heart and lungs.  Please continue Anoro once a day.  Take albuterol 2 puffs up to every 4 hours if needed for shortness of breath.  Follow with Dr Lamonte Sakai in 3 months or sooner if you have any problems.

## 2015-09-02 NOTE — Progress Notes (Signed)
Subjective:    Patient ID: Brent Snow, male    DOB: 1941/08/06, 74 y.o.   MRN: [id number]  HPI 74 yo former smoker, HTN, OSA, DM.  Last seen by me in 2010 for COPD.  He never really felt that Spiriva did much, he remains on Symbicort. He has not had any flares for 5 years. He has noticed more SOB, bothers him with mild exertion, walking. He occasionally hears wheeze. Rare cough. Minimal mucous.   He wears CPAP rarely. Doesn't average once a week. [facility] is provider.    ROV 03/27/14 -- follows for COPD, OSA/OHS. He has been wearing auto-set device for 3 weeks, is due to have data interpreted tomorrow.  He is now on Anoro, feels that he did not lose any ground off symbicort but not necessarily better.   ROV 06/20/14 -- follows for COPD, OSA/OHS, exertional hypoxemia. He has been doing well. He is reliable with his CPAP + O2 bled in.  He has [employer] and is interested in a Marine scientist. His breathing is better since he started using o2 more reliably. He is using albuterol 2 x a day. He is on Anoro qd. He needs full PFT in order to get into pulm rehab.   ROV 10/24/14 -- follow-up visit for COPD, obstructive sleep apnea and exertional hypoxemia. He wears CPAP reliably. Underwent PFT 07/13/14 , FEV1 1.26 (46%). He is scheduled to go to pulm re[facility] at [facility] starting 12/4.  Has been doing well since last time with some good and bad days. He has some occasional wheeze. He wears his O2 prn - whenever he feels SOB, has to walk an incline. No flares since last time.    ROV 09/02/15 -- follow-up visit for COPD with severe airflow limitation, obstructive sleep apnea and exertional hypoxemia. He was last seen in April 2016 by TP.  He has been more limited since April. Less exertional tolerance. On Anoro and ProAir. He hears wheezing w exertion. His wt has been stable. He is wearing CPAP reliably. He did pulm rehab, wasn't sure that it helped him any. He stays active, but doesn;t walk as much as he used to.  His SpO2 is in the 80's many times a day, especially when on his pulsed O2, portable concentrator.  Using 2.5L but not reliably.    Review of Systems  Constitutional: Negative for fever and unexpected weight change.  HENT: Negative for congestion, dental problem, ear pain, nosebleeds, postnasal drip, rhinorrhea, sinus pressure, sneezing, sore throat and trouble swallowing.   Eyes: Negative for redness and itching.  Respiratory: Positive for shortness of breath and wheezing. Negative for cough and chest tightness.   Cardiovascular: Negative for palpitations and leg swelling.  Gastrointestinal: Negative for nausea and vomiting.  Genitourinary: Negative for dysuria.  Musculoskeletal: Negative for joint swelling.       Pain and stiffness  Skin: Negative for rash.  Neurological: Negative for headaches.  Hematological: Does not bruise/bleed easily.  Psychiatric/Behavioral: Negative for suicidal ideas and dysphoric mood. The patient is not nervous/anxious.       Objective:   Physical Exam Filed Vitals:   09/02/15 1532 09/02/15 1536  BP:  148/60  Pulse:  83  Temp:  98.6 F (37 C)  TempSrc:  Oral  Height:  5\' 6"  (1.676 m)  Weight:  226 lb (102.513 kg)  SpO2: 84% 90%   Gen: Pleasant, overweight, in no distress,  normal affect  ENT: No lesions,  mouth clear,  oropharynx clear, no  postnasal drip  Neck: No JVD, no TMG, no carotid bruits  Lungs: No use of accessory muscles, clear without rales or rhonchi  Cardiovascular: RRR, heart sounds normal, no murmur or gallops, no peripheral edema  Musculoskeletal: No deformities, no cyanosis or clubbing  Neuro: alert, non focal  Skin: Warm, no lesions or rashes     Assessment & Plan:  Chronic hypoxemic respiratory failure With poor oxygen compliance. He does wear his CPAP with oxygen bled in every night. He is concerned with people seeing him wearing oxygen. He will reliably saturates in the low 80s at home on room air. I stressed the  importance of oxygen compliance with him today. We talked about the difference in efficacy between continuous and pulsed oxygen systems. It may be that he will need to change his portable concentrator over to a continuous system. I told him that I would like for him to use 3-3.5 L/m and to keep his oxygen saturations greater than 90. I will also check an echocardiogram to assess for evolving pulmonary hypertension as well as a result of and a contributor to his hypoxemia  COPD (chronic obstructive pulmonary disease) You need to wear your oxygen at all times at 2.5 - 3L/min. The goal is to keep your SpO2 > 90%. This will help your breathing and will help [place] prevent new problems with your heart and lungs.  Please continue Anoro once a day.  Take albuterol 2 puffs up to every 4 hours if needed for shortness of breath.  Follow with Dr Lamonte Sakai in 3 months or sooner if you have any problems.  Obstructive sleep apnea Continue current CPAP settings with oxygen bled in  OBESITY, MORBID Discussed the benefits of weight loss with him. I do believe that this would help his functional capacity and cardio pulmonary conditioning significantly.

## 2015-09-02 NOTE — Patient Instructions (Signed)
You need to wear your oxygen at all times at 2.5 - 3L/min. The goal is to keep your SpO2 > 90%. This will help your breathing and will help [place] prevent new problems with your heart and lungs.  Please continue Anoro once a day.  Take albuterol 2 puffs up to every 4 hours if needed for shortness of breath.  Continue CPAP every night We will perform an echocardiogram  Follow with Dr Lamonte Sakai in 3 months or sooner if you have any problems.

## 2015-09-02 NOTE — Assessment & Plan Note (Signed)
Continue current CPAP settings with oxygen bled in

## 2015-09-02 NOTE — Assessment & Plan Note (Signed)
Discussed the benefits of weight loss with him. I do believe that this would help his functional capacity and cardio pulmonary conditioning significantly.

## 2015-09-02 NOTE — Assessment & Plan Note (Signed)
With poor oxygen compliance. He does wear his CPAP with oxygen bled in every night. He is concerned with people seeing him wearing oxygen. He will reliably saturates in the low 80s at home on room air. I stressed the importance of oxygen compliance with him today. We talked about the difference in efficacy between continuous and pulsed oxygen systems. It may be that he will need to change his portable concentrator over to a continuous system. I told him that I would like for him to use 3-3.5 L/m and to keep his oxygen saturations greater than 90. I will also check an echocardiogram to assess for evolving pulmonary hypertension as well as a result of and a contributor to his hypoxemia

## 2015-09-12 ENCOUNTER — Other Ambulatory Visit: Payer: Self-pay

## 2015-09-12 ENCOUNTER — Ambulatory Visit (HOSPITAL_COMMUNITY): Payer: Medicare Other | Attending: Emergency Medicine

## 2015-09-12 DIAGNOSIS — E119 Type 2 diabetes mellitus without complications: Secondary | ICD-10-CM | POA: Insufficient documentation

## 2015-09-12 DIAGNOSIS — I10 Essential (primary) hypertension: Secondary | ICD-10-CM | POA: Diagnosis not present

## 2015-09-12 DIAGNOSIS — I517 Cardiomegaly: Secondary | ICD-10-CM | POA: Diagnosis not present

## 2015-09-12 DIAGNOSIS — Z6834 Body mass index (BMI) 34.0-34.9, adult: Secondary | ICD-10-CM | POA: Insufficient documentation

## 2015-09-12 DIAGNOSIS — E669 Obesity, unspecified: Secondary | ICD-10-CM | POA: Insufficient documentation

## 2015-09-12 DIAGNOSIS — R0600 Dyspnea, unspecified: Secondary | ICD-10-CM | POA: Insufficient documentation

## 2015-09-12 DIAGNOSIS — Z87891 Personal history of nicotine dependence: Secondary | ICD-10-CM | POA: Diagnosis not present

## 2015-09-12 HISTORY — PX: TRANSTHORACIC ECHOCARDIOGRAM: SHX275

## 2015-10-20 ENCOUNTER — Encounter: Payer: Self-pay | Admitting: Pediatrics

## 2015-10-20 ENCOUNTER — Encounter (INDEPENDENT_AMBULATORY_CARE_PROVIDER_SITE_OTHER): Payer: Self-pay

## 2015-10-20 ENCOUNTER — Ambulatory Visit (INDEPENDENT_AMBULATORY_CARE_PROVIDER_SITE_OTHER): Payer: Medicare Other | Admitting: Pediatrics

## 2015-10-20 VITALS — BP 139/87 | HR 71 | Temp 98.2°F | Ht 66.0 in | Wt 243.6 lb

## 2015-10-20 DIAGNOSIS — G4733 Obstructive sleep apnea (adult) (pediatric): Secondary | ICD-10-CM | POA: Diagnosis not present

## 2015-10-20 DIAGNOSIS — J449 Chronic obstructive pulmonary disease, unspecified: Secondary | ICD-10-CM

## 2015-10-20 DIAGNOSIS — Z6839 Body mass index (BMI) 39.0-39.9, adult: Secondary | ICD-10-CM

## 2015-10-20 DIAGNOSIS — E119 Type 2 diabetes mellitus without complications: Secondary | ICD-10-CM | POA: Insufficient documentation

## 2015-10-20 DIAGNOSIS — B353 Tinea pedis: Secondary | ICD-10-CM | POA: Diagnosis not present

## 2015-10-20 DIAGNOSIS — I10 Essential (primary) hypertension: Secondary | ICD-10-CM | POA: Diagnosis not present

## 2015-10-20 MED ORDER — SITAGLIPTIN PHOSPHATE 100 MG PO TABS: 100.0000 mg | ORAL_TABLET | Freq: Every day | ORAL | Status: DC

## 2015-10-20 NOTE — Progress Notes (Signed)
Subjective:    Patient ID: Brent Snow, male    DOB: Oct 04, 1941, 74 y.o.   MRN: [id number]  CC: multiple med problem f/u  HPI: Brent Snow is a 74 y.o. male presenting on 10/20/2015 for New Patient (Initial Visit)  COPD: breathing up and down, does fine when at rest. With wlaking ahs to take breaks often, but as long as he stops and breathes he does ok Uses oxygen when he really thinks he needs it. As long as O2 90% or above he doesn't use oxygen Uses oxygen at night sometimes plugged into his CPAP If he uses CPAP he feels better in the morning Followed by pulm On Anora and albuterol, using albuteorl every day  Obesity: minimizing snack foods, drinking water  DM2: has been on glipazide for years, has tried metformin in th epast, says it made him feel fuzzy headed. Not tried any other medicines that he knows of  Feet itch sometimes   ROS: All systems negative other than what is in HPI  Past Medical History Patient Active Problem List   Diagnosis Date Noted  . Diabetes ([place]) 10/20/2015  . Chronic hypoxemic respiratory failure ([place]) 09/02/2015  . BMI 39.0-39.9,adult 09/06/2007  . Obstructive sleep apnea 09/06/2007  . Essential hypertension 09/06/2007  . COPD (chronic obstructive pulmonary disease) ([place]) 09/06/2007  . DEGENERATIVE JOINT DISEASE 09/06/2007  . DEGENERATIVE DISC DISEASE 09/06/2007   Family History  Problem Relation Age of Onset  . Asthma Mother   . Cancer Maternal Grandmother     stomach   Social History   Social History  . Marital Status: Married    Spouse Name: N/A  . Number of Children: N/A  . Years of Education: N/A   Occupational History  . Not on file.   Social History Main Topics  . Smoking status: Former Smoker -- 2.00 packs/day for 30 years    Types: Cigarettes    Quit date: 12/06/2006  . Smokeless tobacco: Never Used  . Alcohol Use: No  . Drug Use: No  . Sexual Activity: Not on file   Other Topics Concern  . Not on  file   Social History Narrative     Current Outpatient Prescriptions  Medication Sig Dispense Refill  . albuterol (PROAIR HFA) 108 (90 BASE) MCG/ACT inhaler Inhale 2 puffs into the lungs every 4 (four) hours as needed. 18 g 5  . aspirin 81 MG tablet Take 81 mg by mouth daily.    Marland Kitchen glipiZIDE (GLUCOTROL) 5 MG tablet 2 tablets in the morning and 1 tablet at bedtime    . Ibuprofen 200 MG CAPS Take 2 capsules by mouth as needed.     Marland Kitchen lisinopril (PRINIVIL,ZESTRIL) 40 MG tablet Take 0.5 tablets by mouth daily.     . OXYGEN Inhale into the lungs. As directed    . Umeclidinium-Vilanterol (ANORO ELLIPTA) 62.5-25 MCG/INH AEPB Inhale 1 puff into the lungs daily. 3 each 3  . sitaGLIPtin (JANUVIA) 100 MG tablet Take 1 tablet (100 mg total) by mouth daily. 30 tablet 2   No current facility-administered medications for this visit.       Objective:    BP 139/87 mmHg  Pulse 71  Temp(Src) 98.2 F (36.8 C) (Oral)  Ht 5\' 6"  (1.676 m)  Wt 243 lb 9.6 oz (110.496 kg)  BMI 39.34 kg/m2  SpO2 91%  Wt Readings from Last 3 Encounters:  10/20/15 243 lb 9.6 oz (110.496 kg)  09/02/15 226 lb (102.513 kg)  03/14/15 244 lb (110.678 kg)     Gen: NAD, alert, cooperative with exam, NCAT EYES: EOMI, no scleral injection or icterus ENT:  TMs pearly gray b/l, OP without erythema LYMPH: no cervical LAD CV: NRRR, normal S1/S2, no murmur, distal pulses 2+ b/l Resp: CTABL, no wheezes, normal WOB Abd: +BS, soft, NTND. no guarding or organomegaly Ext: non-pitting edema b/l ankles, warm Neuro: Alert and oriented, strength equal b/l UE and LE, coordination grossly normal MSK: normal muscle bulk Skin: b/l feet with thickened yellow nails on all toes, b/l soles and sides of feet with peeling skin     Assessment & Plan:   Jeyren was seen today for multiple med problem f/u.  Diagnoses and all orders for this visit:  Obstructive sleep apnea continue CPAP  Chronic obstructive pulmonary disease, unspecified COPD  type ([employer]) Continue oxygen, Anora, albuterol as needed. Breathing well controlled recently. Normal exam today.  BMI 39.0-39.9,adult Minimize snacks, drinking water. Able to do limited exercise due to trouble with SOB with COPD.  Essential hypertension Borderline today. Continue current meds, RTC in 4 weeks for recheck  Type 2 diabetes mellitus without complication, without long-term current use of insulin (HCC) On glipizide alone now. Will start [place]. Last HgA1c 8.3 two months ago, recheck in 4 weeks. -     sitaGLIPtin (JANUVIA) 100 MG tablet; Take 1 tablet (100 mg total) by mouth daily.  Tinea pedis of both feet Clotrimazole BID.  Follow up plan: Return in about 4 weeks (around 11/17/2015).  [name], MD [facility] 10/20/2015, 12:10 PM

## 2015-11-25 ENCOUNTER — Ambulatory Visit (INDEPENDENT_AMBULATORY_CARE_PROVIDER_SITE_OTHER): Payer: Medicare Other | Admitting: Emergency Medicine

## 2015-11-25 ENCOUNTER — Encounter: Payer: Self-pay | Admitting: Emergency Medicine

## 2015-11-25 VITALS — BP 132/88 | HR 64 | Ht 66.0 in | Wt 220.0 lb

## 2015-11-25 DIAGNOSIS — J449 Chronic obstructive pulmonary disease, unspecified: Secondary | ICD-10-CM | POA: Diagnosis not present

## 2015-11-25 DIAGNOSIS — G4733 Obstructive sleep apnea (adult) (pediatric): Secondary | ICD-10-CM

## 2015-11-25 NOTE — Assessment & Plan Note (Signed)
Please continue your Anoro daily You need to wear oxygen at 2.5 - 3 L/min with exertion. Compliance has been poor, encouraged him to improve.  Take albuterol 2 puffs up to every 4 hours if needed for shortness of breath.  He did not want the flu shot

## 2015-11-25 NOTE — Progress Notes (Signed)
Subjective:    Patient ID: Brent Snow, male    DOB: 02-15-1941, 74 y.o.   MRN: [id number]  HPI 74 yo former smoker, HTN, OSA, DM.  Last seen by me in 2010 for COPD.  He never really felt that Spiriva did much, he remains on Symbicort. He has not had any flares for 5 years. He has noticed more SOB, bothers him with mild exertion, walking. He occasionally hears wheeze. Rare cough. Minimal mucous.   He wears CPAP rarely. Doesn't average once a week. [facility] is provider.    ROV 03/27/14 -- follows for COPD, OSA/OHS. He has been wearing auto-set device for 3 weeks, is due to have data interpreted tomorrow.  He is now on Anoro, feels that he did not lose any ground off symbicort but not necessarily better.   ROV 06/20/14 -- follows for COPD, OSA/OHS, exertional hypoxemia. He has been doing well. He is reliable with his CPAP + O2 bled in.  He has [employer] and is interested in a Marine scientist. His breathing is better since he started using o2 more reliably. He is using albuterol 2 x a day. He is on Anoro qd. He needs full PFT in order to get into pulm rehab.   ROV 10/24/14 -- follow-up visit for COPD, obstructive sleep apnea and exertional hypoxemia. He wears CPAP reliably. Underwent PFT 07/13/14 , FEV1 1.26 (46%). He is scheduled to go to pulm rehab at [facility] starting 12/4.  Has been doing well since last time with some good and bad days. He has some occasional wheeze. He wears his O2 prn - whenever he feels SOB, has to walk an incline. No flares since last time.    ROV 09/02/15 -- follow-up visit for COPD with severe airflow limitation, obstructive sleep apnea and exertional hypoxemia. He was last seen in April 2016 by TP.  He has been more limited since April. Less exertional tolerance. On Anoro and ProAir. He hears wheezing w exertion. His wt has been stable. He is wearing CPAP reliably. He did pulm rehab, wasn't sure that it helped him any. He stays active, but doesn;t walk as much as he used to.  His SpO2 is in the 80's many times a day, especially when on his pulsed O2, portable concentrator.  Using 2.5L but not reliably.   ROV 11/25/15 -- follow-up visit for severe obstructive lung disease, COPD,obstructive sleep apnea and exertional hypoxemia. He has been doing well, was able to go to [facility] recently, was able to walk around. He did need his albuterol some. He does not wear his oxygen out. He is wearing his CPAP reliably. He is on Anoro, tolerates well, believes it works. He has daily wheeze. Minimal congestion and cough.    Review of Systems  Constitutional: Negative for fever and unexpected weight change.  HENT: Negative for congestion, dental problem, ear pain, nosebleeds, postnasal drip, rhinorrhea, sinus pressure, sneezing, sore throat and trouble swallowing.   Eyes: Negative for redness and itching.  Respiratory: Positive for shortness of breath and wheezing. Negative for cough and chest tightness.   Cardiovascular: Negative for palpitations and leg swelling.  Gastrointestinal: Negative for nausea and vomiting.  Genitourinary: Negative for dysuria.  Musculoskeletal: Negative for joint swelling.       Pain and stiffness  Skin: Negative for rash.  Neurological: Negative for headaches.  Hematological: Does not bruise/bleed easily.  Psychiatric/Behavioral: Negative for suicidal ideas and dysphoric mood. The patient is not nervous/anxious.       Objective:  Physical Exam Filed Vitals:   11/25/15 1435  BP: 132/88  Pulse: 64  Height: 5\' 6"  (1.676 m)  Weight: 220 lb (99.791 kg)  SpO2: 90%   Gen: Pleasant, overweight, in no distress,  normal affect  ENT: No lesions,  mouth clear,  oropharynx clear, no postnasal drip  Neck: No JVD, no TMG, no carotid bruits  Lungs: No use of accessory muscles, clear without rales or rhonchi  Cardiovascular: RRR, heart sounds normal, no murmur or gallops, no peripheral edema  Musculoskeletal: No deformities, no cyanosis or  clubbing  Neuro: alert, non focal  Skin: Warm, no lesions or rashes     Assessment & Plan:  COPD (chronic obstructive pulmonary disease) Please continue your Anoro daily You need to wear oxygen at 2.5 - 3 L/min with exertion. Compliance has been poor, encouraged him to improve.  Take albuterol 2 puffs up to every 4 hours if needed for shortness of breath.  He did not want the flu shot   Obstructive sleep apnea Good  Compliance with CPAP

## 2015-11-25 NOTE — Assessment & Plan Note (Signed)
Good  Compliance with CPAP

## 2015-11-25 NOTE — Patient Instructions (Signed)
Please continue your Anoro daily You need to wear oxygen at 2.5 - 3 L/min with exertion.  Take albuterol 2 puffs up to every 4 hours if needed for shortness of breath.  Please continue your CPAp every night.  Follow with Dr Lamonte Sakai in 3 months or sooner if you have any problems.

## 2015-11-26 ENCOUNTER — Encounter: Payer: Self-pay | Admitting: Pediatrics

## 2015-11-26 ENCOUNTER — Ambulatory Visit (INDEPENDENT_AMBULATORY_CARE_PROVIDER_SITE_OTHER): Payer: Medicare Other | Admitting: Pediatrics

## 2015-11-26 ENCOUNTER — Telehealth: Payer: Self-pay | Admitting: Pediatrics

## 2015-11-26 VITALS — BP 164/86 | HR 71 | Temp 97.4°F | Ht 66.0 in | Wt 234.0 lb

## 2015-11-26 DIAGNOSIS — B353 Tinea pedis: Secondary | ICD-10-CM

## 2015-11-26 DIAGNOSIS — E119 Type 2 diabetes mellitus without complications: Secondary | ICD-10-CM

## 2015-11-26 DIAGNOSIS — J9611 Chronic respiratory failure with hypoxia: Secondary | ICD-10-CM

## 2015-11-26 DIAGNOSIS — I10 Essential (primary) hypertension: Secondary | ICD-10-CM

## 2015-11-26 LAB — POCT GLYCOSYLATED HEMOGLOBIN (HGB A1C): Hemoglobin A1C: 8.8

## 2015-11-26 MED ORDER — SITAGLIPTIN PHOSPHATE 100 MG PO TABS: 100.0000 mg | ORAL_TABLET | Freq: Every day | ORAL | Status: DC

## 2015-11-26 NOTE — Progress Notes (Signed)
Subjective:    Patient ID: Brent Snow, male    DOB: 03/19/1941, 74 y.o.   MRN: [id number]  CC: Follow-up DM2  HPI: Brent Snow is a 73 y.o. male presenting for Follow-up  DM2:  Doesn't check BGLs at home  BMI Trying not to eat as much, working on portion control Wants to get some weight off, has had several pounds of weight loss  Breathing has been ok Recently went to [facility] with family, did fine walking around, using oxygen as needed   Depression screen [facility] 2/9 11/26/2015 10/20/2015 11/08/2014  Decreased Interest 0 0 0  Down, Depressed, Hopeless 0 0 0  PHQ - 2 Score 0 0 0     Relevant past medical, surgical, family and social history reviewed and updated as indicated. Interim medical history since our last visit reviewed. Allergies and medications reviewed and updated.    ROS: Per HPI unless specifically indicated above  History  Smoking status  . Former Smoker -- 2.00 packs/day for 30 years  . Types: Cigarettes  . Quit date: 12/06/2006  Smokeless tobacco  . Never Used    Past Medical History Patient Active Problem List   Diagnosis Date Noted  . Diabetes ([place]) 10/20/2015  . Chronic hypoxemic respiratory failure ([place]) 09/02/2015  . BMI 39.0-39.9,adult 09/06/2007  . Obstructive sleep apnea 09/06/2007  . Essential hypertension 09/06/2007  . COPD (chronic obstructive pulmonary disease) ([place]) 09/06/2007  . DEGENERATIVE JOINT DISEASE 09/06/2007  . DEGENERATIVE DISC DISEASE 09/06/2007        Objective:    BP 164/86 mmHg  Pulse 71  Temp(Src) 97.4 F (36.3 C) (Oral)  Ht 5\' 6"  (1.676 m)  Wt 234 lb (106.142 kg)  BMI 37.79 kg/m2  Wt Readings from Last 3 Encounters:  11/26/15 234 lb (106.142 kg)  11/25/15 220 lb (99.791 kg)  10/20/15 243 lb 9.6 oz (110.496 kg)     Gen: NAD, alert, cooperative with exam, NCAT EYES: EOMI, no scleral injection or icterus ENT:  OP without erythema LYMPH: no cervical LAD CV: NRRR, normal S1/S2, no murmur,  distal pulses 2+ b/l Resp: CTABL, no wheezes, comfortable WOB Abd: +BS, soft, NTND. no guarding or organomegaly Ext: foot exam with onychomycosis on all toe nails, mild peeling bottoms of feet, complete sensation throughout with monofilament Neuro: Alert and oriented     Assessment & Plan:    Brent Snow was seen today for follow-up multiple med problems  Diagnoses and all orders for this visit:  Type 2 diabetes mellitus without complication, without long-term current use of insulin (HCC) Sees Dr. Truman Hayward at [facility] in [place] for eye exams. Due soon. Pt to call to schedule appt HgA1c slightly higher at 8.9. Due to miscommunication, pt had stopped glipizide, taking only [place]. Will start taking both. RTC in 3 months. -     POCT glycosylated hemoglobin (Hb A1C) -     : sitaGLIPtin (JANUVIA) 100 MG tablet; Take 1 tablet (100 mg total) by mouth daily. -     Microalbumin / creatinine urine ratio  Chronic hypoxemic respiratory failure (HCC) Normal exam today, on oxygen with exertion  Essential hypertension Elevated today, has had better control at other recent office visits. Will check BPs at home. Gave return precautions, will let me know what BPs are at home.  TInea pedis Continue topical antifungal, improved  Follow up plan: Return in about 3 months (around 02/24/2016) for DM2.  [name], MD [facility] 11/26/2015, 11:14 AM

## 2015-11-26 NOTE — Telephone Encounter (Signed)
rx sent to [facility] at patients request.

## 2015-11-26 NOTE — Patient Instructions (Signed)
Check blood pressure at home, let me know if >150, >90 regularly.

## 2015-11-27 DIAGNOSIS — B353 Tinea pedis: Secondary | ICD-10-CM | POA: Insufficient documentation

## 2015-11-27 LAB — MICROALBUMIN / CREATININE URINE RATIO
Creatinine, Urine: 34.9 mg/dL
MICROALB/CREAT RATIO: 83.7 mg/g creat — ABNORMAL HIGH (ref 0.0–30.0)
Microalbumin, Urine: 29.2 ug/mL

## 2016-02-25 ENCOUNTER — Ambulatory Visit (INDEPENDENT_AMBULATORY_CARE_PROVIDER_SITE_OTHER): Payer: Medicare Other | Admitting: Pediatrics

## 2016-02-25 ENCOUNTER — Encounter: Payer: Self-pay | Admitting: Pediatrics

## 2016-02-25 VITALS — BP 131/77 | HR 68 | Temp 97.2°F | Ht 66.0 in | Wt 236.0 lb

## 2016-02-25 DIAGNOSIS — E119 Type 2 diabetes mellitus without complications: Secondary | ICD-10-CM | POA: Diagnosis not present

## 2016-02-25 DIAGNOSIS — I10 Essential (primary) hypertension: Secondary | ICD-10-CM

## 2016-02-25 DIAGNOSIS — J449 Chronic obstructive pulmonary disease, unspecified: Secondary | ICD-10-CM | POA: Diagnosis not present

## 2016-02-25 DIAGNOSIS — R0602 Shortness of breath: Secondary | ICD-10-CM

## 2016-02-25 DIAGNOSIS — Z6839 Body mass index (BMI) 39.0-39.9, adult: Secondary | ICD-10-CM | POA: Diagnosis not present

## 2016-02-25 LAB — BAYER DCA HB A1C WAIVED: HB A1C (BAYER DCA - WAIVED): 8.7 % — ABNORMAL HIGH (ref <7.0)

## 2016-02-25 MED ORDER — GLIPIZIDE 5 MG PO TABS: 5.0000 mg | ORAL_TABLET | Freq: Two times a day (BID) | ORAL | Status: DC

## 2016-02-25 NOTE — Addendum Note (Signed)
Addended by: Eustaquio Maize on: 02/25/2016 04:51 PM   Modules accepted: Orders

## 2016-02-25 NOTE — Progress Notes (Addendum)
Subjective:    Patient ID: Brent Snow, male    DOB: 1941-03-30, 75 y.o.   MRN: [id number]  CC: Follow-up DM2  HPI: Brent Snow is a 75 y.o. male presenting for Follow-up  DM2: has been on metformin twice in the past, caused GI problems and doesn't want to be on it Glipizide made him feel bad Januvia he has been taking regularly Not regularly taking in sweets, does like pies, cakes and ice cream, not eating every day  Takes ibuprofen occasionally for aches and pains after exercise, not daily  COPD: Cant walk far because of COPD, if he rests he is able to get back to it Anoro affordable, getting from [employer] [employer], he notices when he misses a day of it Using albuterol a couple times a day for SOB, it does help At grocery stores or similar uses a cart to lean on, does better with walking then No chest pain with SOB.  BMI elevated: tries to walk at least 30 min total on treadmill daily  OSA: using CPAP nightly  Eye exam--going to see Dr. Marin Comment at [facility], going to call and make an appt Colonoscopy--last was over ten years ago    Depression screen [facility] 2/9 02/25/2016 11/26/2015 10/20/2015 11/08/2014  Decreased Interest 0 0 0 0  Down, Depressed, Hopeless 0 0 0 0  PHQ - 2 Score 0 0 0 0     Relevant past medical, surgical, family and social history reviewed and updated as indicated. Interim medical history since our last visit reviewed. Allergies and medications reviewed and updated.    ROS: Per HPI unless specifically indicated above  History  Smoking status  . Former Smoker -- 2.00 packs/day for 30 years  . Types: Cigarettes  . Quit date: 12/06/2006  Smokeless tobacco  . Never Used    Past Medical History Patient Active Problem List   Diagnosis Date Noted  . Tinea pedis of both feet 11/27/2015  . Diabetes ([place]) 10/20/2015  . Chronic hypoxemic respiratory failure ([place]) 09/02/2015  . BMI 39.0-39.9,adult 09/06/2007  . Obstructive sleep apnea 09/06/2007    . Essential hypertension 09/06/2007  . COPD (chronic obstructive pulmonary disease) ([place]) 09/06/2007  . DEGENERATIVE JOINT DISEASE 09/06/2007  . DEGENERATIVE DISC DISEASE 09/06/2007       Objective:    BP 131/77 mmHg  Pulse 68  Temp(Src) 97.2 F (36.2 C) (Oral)  Ht '5\' 6"'  (1.676 m)  Wt 236 lb (107.049 kg)  BMI 38.11 kg/m2  Wt Readings from Last 3 Encounters:  02/25/16 236 lb (107.049 kg)  11/26/15 234 lb (106.142 kg)  11/25/15 220 lb (99.791 kg)     Gen: NAD, alert, cooperative with exam, NCAT EYES: EOMI, no scleral injection or icterus ENT:  OP without erythema LYMPH: no cervical LAD CV: NRRR, normal S1/S2, no murmur, distal pulses 2+ b/l Resp: CTABL, no wheezes, normal WOB Abd: +BS, soft, NT Ext: No edema, warm Neuro: Alert and oriented MSK: normal muscle bulk     Assessment & Plan:    [name] was seen today for follow-up multiple med problems.  Diagnoses and all orders for this visit:  Shortness of breath/Chronic obstructive pulmonary disease, unspecified COPD type ([place]) SOB likely related to COPD, improves with albuteorl, unchanged from baseline. No associated chest pain, nausea, vomiting. Has oxygen at home to wear as needed, says he rarely needs it. Wearing CPAP nightly. Followed by pulm -     CBC with Differential/Platelet  Essential hypertension Adequate control today. -  CMP14+EGFR  BMI 39.0-39.9,adult Continue lifestyle changes -     CMP14+EGFR  Type 2 diabetes mellitus without complication, without long-term current use of insulin (HCC) Intolerant of metformin, stopped glipizide because he was worried it was causing weight gain. Started on [place] at last visit. If still elevated HgA1c will add likely SGLT-2i Pt to call for eye exam, follows with Dr. Marin Comment at [facility] Hb A1c Waived  HgA1c is 8.7 on januvia alone. Pt wants to go back to taking glipizide because he has so much at home. Discussed advantages of other new medicine, pt  wants to try glipizide first. Recheck next visit.  Follow up plan: Return in about 4 months (around 06/26/2016).  [name], MD [facility] 02/25/2016, 10:30 AM

## 2016-02-26 LAB — CBC WITH DIFFERENTIAL/PLATELET
BASOS: 0 %
Basophils Absolute: 0 10*3/uL (ref 0.0–0.2)
EOS (ABSOLUTE): 0.1 10*3/uL (ref 0.0–0.4)
EOS: 2 %
HEMATOCRIT: 51.3 % — AB (ref 37.5–51.0)
HEMOGLOBIN: 16.9 g/dL (ref 12.6–17.7)
IMMATURE GRANS (ABS): 0 10*3/uL (ref 0.0–0.1)
IMMATURE GRANULOCYTES: 0 %
LYMPHS: 26 %
Lymphocytes Absolute: 1.6 10*3/uL (ref 0.7–3.1)
MCH: 31.5 pg (ref 26.6–33.0)
MCHC: 32.9 g/dL (ref 31.5–35.7)
MCV: 96 fL (ref 79–97)
MONOCYTES: 9 %
Monocytes Absolute: 0.5 10*3/uL (ref 0.1–0.9)
NEUTROS ABS: 4 10*3/uL (ref 1.4–7.0)
NEUTROS PCT: 63 %
PLATELETS: 150 10*3/uL (ref 150–379)
RBC: 5.36 x10E6/uL (ref 4.14–5.80)
RDW: 14 % (ref 12.3–15.4)
WBC: 6.3 10*3/uL (ref 3.4–10.8)

## 2016-02-26 LAB — CMP14+EGFR
ALBUMIN: 4.1 g/dL (ref 3.5–4.8)
ALT: 19 IU/L (ref 0–44)
AST: 16 IU/L (ref 0–40)
Albumin/Globulin Ratio: 1.5 (ref 1.2–2.2)
Alkaline Phosphatase: 80 IU/L (ref 39–117)
BUN/Creatinine Ratio: 21 (ref 10–22)
BUN: 21 mg/dL (ref 8–27)
Bilirubin Total: 0.4 mg/dL (ref 0.0–1.2)
CO2: 30 mmol/L — AB (ref 18–29)
CREATININE: 1.01 mg/dL (ref 0.76–1.27)
Calcium: 9.1 mg/dL (ref 8.6–10.2)
Chloride: 97 mmol/L (ref 96–106)
GFR, EST AFRICAN AMERICAN: 86 mL/min/{1.73_m2} (ref >59)
GFR, EST NON AFRICAN AMERICAN: 74 mL/min/{1.73_m2} (ref >59)
GLUCOSE: 201 mg/dL — AB (ref 65–99)
Globulin, Total: 2.8 g/dL (ref 1.5–4.5)
Potassium: 4.8 mmol/L (ref 3.5–5.2)
Sodium: 142 mmol/L (ref 134–144)
TOTAL PROTEIN: 6.9 g/dL (ref 6.0–8.5)

## 2016-03-02 ENCOUNTER — Ambulatory Visit: Payer: Medicare Other | Admitting: Emergency Medicine

## 2016-03-22 ENCOUNTER — Encounter: Payer: Self-pay | Admitting: Emergency Medicine

## 2016-03-22 ENCOUNTER — Ambulatory Visit (INDEPENDENT_AMBULATORY_CARE_PROVIDER_SITE_OTHER): Payer: Medicare Other | Admitting: Emergency Medicine

## 2016-03-22 VITALS — BP 114/70 | HR 88 | Ht 66.0 in | Wt 215.0 lb

## 2016-03-22 DIAGNOSIS — J449 Chronic obstructive pulmonary disease, unspecified: Secondary | ICD-10-CM | POA: Diagnosis not present

## 2016-03-22 DIAGNOSIS — G4733 Obstructive sleep apnea (adult) (pediatric): Secondary | ICD-10-CM

## 2016-03-22 DIAGNOSIS — J9611 Chronic respiratory failure with hypoxia: Secondary | ICD-10-CM

## 2016-03-22 NOTE — Patient Instructions (Signed)
Please continue Anoro Take albuterol 2 puffs up to every 4 hours if needed for shortness of breath.  Continue to use your oxygen with exertion.  Follow with Dr Lamonte Sakai in 4 months or sooner if you have any problems.

## 2016-03-22 NOTE — Assessment & Plan Note (Signed)
Continue CPAP every night. 

## 2016-03-22 NOTE — Progress Notes (Signed)
Subjective:    Patient ID: Brent Snow, male    DOB: [birth year], 75 y.o.   MRN: [id number]  HPI 74 yo former smoker, HTN, OSA, DM.  Last seen by me in 2010 for COPD.  Brent Snow never really felt that Spiriva did much, Brent Snow remains on Symbicort. Brent Snow has not had any flares for 5 years. Brent Snow has noticed more SOB, bothers him with mild exertion, walking. Brent Snow occasionally hears wheeze. Rare cough. Minimal mucous.   Brent Snow wears CPAP rarely. Doesn't average once a week. [facility] is provider.    ROV 03/27/14 -- follows for COPD, OSA/OHS. Brent Snow has been wearing auto-set device for 3 weeks, is due to have data interpreted tomorrow.  Brent Snow is now on Anoro, feels that Brent Snow did not lose any ground off symbicort but not necessarily better.   ROV 06/20/14 -- follows for COPD, OSA/OHS, exertional hypoxemia. Brent Snow has been doing well. Brent Snow is reliable with his CPAP + O2 bled in.  Brent Snow has [employer] and is interested in a Marine scientist. His breathing is better since Brent Snow started using o2 more reliably. Brent Snow is using albuterol 2 x a day. Brent Snow is on Anoro qd. Brent Snow needs full PFT in order to get into pulm rehab.   ROV 10/24/14 -- follow-up visit for COPD, obstructive sleep apnea and exertional hypoxemia. Brent Snow wears CPAP reliably. Underwent PFT 07/13/14 , FEV1 1.26 (46%). Brent Snow is scheduled to go to pulm rehab at [facility] starting 12/4.  Has been doing well since last time with some good and bad days. Brent Snow has some occasional wheeze. Brent Snow wears his O2 prn - whenever Brent Snow feels SOB, has to walk an incline. No flares since last time.    ROV 09/02/15 -- follow-up visit for COPD with severe airflow limitation, obstructive sleep apnea and exertional hypoxemia. Brent Snow was last seen in April 2016 by TP.  Brent Snow has been more limited since April. Less exertional tolerance. On Anoro and ProAir. Brent Snow hears wheezing w exertion. His wt has been stable. Brent Snow is wearing CPAP reliably. Brent Snow did pulm rehab, wasn't sure that it helped him any. Brent Snow stays active, but doesn;t walk as much as Brent Snow used to.  His SpO2 is in the 80's many times a day, especially when on his pulsed O2, portable concentrator.  Using 2.5L but not reliably.   ROV 11/25/15 -- follow-up visit for severe obstructive lung disease, COPD,obstructive sleep apnea and exertional hypoxemia. Brent Snow has been doing well, was able to go to [facility] recently, was able to walk around. Brent Snow did need his albuterol some. Brent Snow does not wear his oxygen out. Brent Snow is wearing his CPAP reliably. Brent Snow is on Anoro, tolerates well, believes it works. Brent Snow has daily wheeze. Minimal congestion and cough.   ROV 03/22/16 -- patient has a history of severe COPD, obstructive sleep apnea and exertional hypoxemia. Brent Snow has not been reliable with his oxygen with exertion. Brent Snow has been doing fairly well - does have exertional SOB. Brent Snow is complaint w CPAP. Brent Snow does use his O2 in the afternoon, notices a difference when Brent Snow uses it.  Brent Snow has had some nasal congestion lately, suspects allergies. Brent Snow is on Anoro, feels that Brent Snow is benefiting. Uses proair about several times a day. Not coughing.     Review of Systems  Constitutional: Negative for fever and unexpected weight change.  HENT: Negative for congestion, dental problem, ear pain, nosebleeds, postnasal drip, rhinorrhea, sinus pressure, sneezing, sore throat and trouble swallowing.   Eyes: Negative for redness and itching.  Respiratory: Positive for cough and shortness of breath. Negative for chest tightness and wheezing.   Cardiovascular: Negative for palpitations and leg swelling.  Gastrointestinal: Negative for nausea and vomiting.  Genitourinary: Negative for dysuria.  Musculoskeletal: Negative for joint swelling.       Pain and stiffness  Skin: Negative for rash.  Neurological: Negative for headaches.  Hematological: Does not bruise/bleed easily.  Psychiatric/Behavioral: Negative for suicidal ideas and dysphoric mood. The patient is not nervous/anxious.       Objective:   Physical Exam Filed Vitals:   03/22/16 1445 03/22/16  1446  BP:  114/70  Pulse:  88  Height: 5\' 6"  (1.676 m)   Weight: 215 lb (97.523 kg)   SpO2:  90%   Gen: Pleasant, overweight, in no distress,  normal affect  ENT: No lesions,  mouth clear,  oropharynx clear, no postnasal drip  Neck: No JVD, no TMG, no carotid bruits  Lungs: No use of accessory muscles, clear without rales or rhonchi  Cardiovascular: RRR, heart sounds normal, no murmur or gallops, no peripheral edema  Musculoskeletal: No deformities, no cyanosis or clubbing  Neuro: alert, non focal  Skin: Warm, no lesions or rashes     Assessment & Plan:  COPD (chronic obstructive pulmonary disease) Please continue Anoro Take albuterol 2 puffs up to every 4 hours if needed for shortness of breath.  Continue to use your oxygen with exertion.  Follow with Dr Lamonte Sakai in 4 months or sooner if you have any problems.  Obstructive sleep apnea Continue CPAP every night  Chronic hypoxemic respiratory failure I reviewed his echocardiogram today. This shows slightly enlarged right atrium but his right ventricle was normal in size and function. I explained to him the importance of compliance with his oxygen with exertion with regard to pulmonary hypertension and heart function. Brent Snow only uses his oxygen with some exertion, feels that Brent Snow will only use it when Brent Snow needs it or feels dyspneic.

## 2016-03-22 NOTE — Assessment & Plan Note (Signed)
Please continue Anoro Take albuterol 2 puffs up to every 4 hours if needed for shortness of breath.  Continue to use your oxygen with exertion.  Follow with Dr Lamonte Sakai in 4 months or sooner if you have any problems.

## 2016-03-22 NOTE — Assessment & Plan Note (Signed)
I reviewed his echocardiogram today. This shows slightly enlarged right atrium but his right ventricle was normal in size and function. I explained to him the importance of compliance with his oxygen with exertion with regard to pulmonary hypertension and heart function. He only uses his oxygen with some exertion, feels that he will only use it when he needs it or feels dyspneic.

## 2016-05-05 ENCOUNTER — Other Ambulatory Visit: Payer: Self-pay | Admitting: Pediatrics

## 2016-06-19 ENCOUNTER — Ambulatory Visit (INDEPENDENT_AMBULATORY_CARE_PROVIDER_SITE_OTHER): Payer: Medicare Other | Admitting: Family Medicine

## 2016-06-19 ENCOUNTER — Encounter: Payer: Self-pay | Admitting: Family Medicine

## 2016-06-19 DIAGNOSIS — S139XXA Sprain of joints and ligaments of unspecified parts of neck, initial encounter: Secondary | ICD-10-CM

## 2016-06-19 DIAGNOSIS — S134XXA Sprain of ligaments of cervical spine, initial encounter: Secondary | ICD-10-CM

## 2016-06-19 MED ORDER — CYCLOBENZAPRINE HCL 10 MG PO TABS: 10.0000 mg | ORAL_TABLET | Freq: Three times a day (TID) | ORAL | Status: DC | PRN
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
## 2016-06-28 ENCOUNTER — Encounter: Payer: Self-pay | Admitting: Pediatrics

## 2016-06-28 ENCOUNTER — Ambulatory Visit (INDEPENDENT_AMBULATORY_CARE_PROVIDER_SITE_OTHER): Payer: Medicare Other | Admitting: Pediatrics

## 2016-06-28 VITALS — BP 137/78 | HR 71 | Temp 97.9°F | Ht 66.0 in | Wt 231.0 lb

## 2016-06-28 DIAGNOSIS — G4733 Obstructive sleep apnea (adult) (pediatric): Secondary | ICD-10-CM

## 2016-06-28 DIAGNOSIS — E119 Type 2 diabetes mellitus without complications: Secondary | ICD-10-CM

## 2016-06-28 DIAGNOSIS — I10 Essential (primary) hypertension: Secondary | ICD-10-CM | POA: Diagnosis not present

## 2016-06-28 DIAGNOSIS — J9611 Chronic respiratory failure with hypoxia: Secondary | ICD-10-CM | POA: Diagnosis not present

## 2016-06-28 DIAGNOSIS — Z6839 Body mass index (BMI) 39.0-39.9, adult: Secondary | ICD-10-CM

## 2016-06-28 DIAGNOSIS — H6121 Impacted cerumen, right ear: Secondary | ICD-10-CM | POA: Diagnosis not present

## 2016-06-28 DIAGNOSIS — E785 Hyperlipidemia, unspecified: Secondary | ICD-10-CM

## 2016-06-28 DIAGNOSIS — J449 Chronic obstructive pulmonary disease, unspecified: Secondary | ICD-10-CM | POA: Diagnosis not present

## 2016-06-28 LAB — BAYER DCA HB A1C WAIVED: HB A1C: 7.7 % — AB (ref <7.0)

## 2016-06-28 MED ORDER — LISINOPRIL 20 MG PO TABS: 20.0000 mg | ORAL_TABLET | Freq: Every day | ORAL | 1 refills | Status: DC

## 2016-06-28 MED ORDER — METFORMIN HCL 500 MG PO TABS: 500.0000 mg | ORAL_TABLET | Freq: Two times a day (BID) | ORAL | 3 refills | Status: DC

## 2016-06-28 MED ORDER — ALBUTEROL SULFATE HFA 108 (90 BASE) MCG/ACT IN AERS: 2.0000 | INHALATION_SPRAY | RESPIRATORY_TRACT | 5 refills | Status: DC | PRN

## 2016-06-28 MED ORDER — PRAVASTATIN SODIUM 40 MG PO TABS: 40.0000 mg | ORAL_TABLET | Freq: Every day | ORAL | 3 refills | Status: DC

## 2016-06-28 MED ORDER — UMECLIDINIUM-VILANTEROL 62.5-25 MCG/INH IN AEPB: 1.0000 | INHALATION_SPRAY | Freq: Every day | RESPIRATORY_TRACT | 3 refills | Status: DC
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
## 2016-06-29 LAB — LIPID PANEL
CHOL/HDL RATIO: 4.5 ratio (ref 0.0–5.0)
CHOLESTEROL TOTAL: 166 mg/dL (ref 100–199)
HDL: 37 mg/dL — ABNORMAL LOW (ref >39)
LDL Calculated: 99 mg/dL (ref 0–99)
TRIGLYCERIDES: 152 mg/dL — AB (ref 0–149)
VLDL Cholesterol Cal: 30 mg/dL (ref 5–40)

## 2016-06-29 LAB — BMP8+EGFR
BUN/Creatinine Ratio: 20 (ref 10–24)
BUN: 19 mg/dL (ref 8–27)
CALCIUM: 9 mg/dL (ref 8.6–10.2)
CO2: 24 mmol/L (ref 18–29)
CREATININE: 0.93 mg/dL (ref 0.76–1.27)
Chloride: 102 mmol/L (ref 96–106)
GFR calc Af Amer: 94 mL/min/{1.73_m2} (ref >59)
GFR, EST NON AFRICAN AMERICAN: 81 mL/min/{1.73_m2} (ref >59)
Glucose: 179 mg/dL — ABNORMAL HIGH (ref 65–99)
Potassium: 5.2 mmol/L (ref 3.5–5.2)
SODIUM: 142 mmol/L (ref 134–144)

## 2016-09-29 ENCOUNTER — Ambulatory Visit (INDEPENDENT_AMBULATORY_CARE_PROVIDER_SITE_OTHER): Payer: Medicare Other | Admitting: Pediatrics

## 2016-09-29 ENCOUNTER — Encounter: Payer: Self-pay | Admitting: Pediatrics

## 2016-09-29 VITALS — BP 135/73 | HR 74 | Temp 97.4°F | Ht 66.0 in | Wt 244.0 lb

## 2016-09-29 DIAGNOSIS — J449 Chronic obstructive pulmonary disease, unspecified: Secondary | ICD-10-CM | POA: Diagnosis not present

## 2016-09-29 DIAGNOSIS — E119 Type 2 diabetes mellitus without complications: Secondary | ICD-10-CM | POA: Diagnosis not present

## 2016-09-29 DIAGNOSIS — Z6839 Body mass index (BMI) 39.0-39.9, adult: Secondary | ICD-10-CM | POA: Diagnosis not present

## 2016-09-29 DIAGNOSIS — H6123 Impacted cerumen, bilateral: Secondary | ICD-10-CM

## 2016-09-29 DIAGNOSIS — G4733 Obstructive sleep apnea (adult) (pediatric): Secondary | ICD-10-CM | POA: Diagnosis not present

## 2016-09-29 DIAGNOSIS — J9611 Chronic respiratory failure with hypoxia: Secondary | ICD-10-CM

## 2016-09-29 DIAGNOSIS — I10 Essential (primary) hypertension: Secondary | ICD-10-CM

## 2016-09-29 LAB — BAYER DCA HB A1C WAIVED: HB A1C (BAYER DCA - WAIVED): 8.6 % — ABNORMAL HIGH (ref <7.0)

## 2016-09-29 NOTE — Progress Notes (Signed)
  Subjective:   Patient ID: Brent Snow, male    DOB: 1941/03/22, 75 y.o.   MRN: [id number] CC: Follow-up (3 month) DM2  HPI: Brent Snow is a 75 y.o. male presenting for Follow-up (3 month)  DM2: Not checking BGLs at home Taking glipizide, metformin regularly, tolerating well  Ears still bothering him Feels like talking in a barrel Uses qtips several times a day  HTN: taking medicines daily No headaches No chest pain  OSA: Uses CPAP nightly Sometimes uses nighttime oxygen  SOB no worse than usual Ery interested in losing weight Eating candy corn regularly now Granola bars several days a week Thinking about adkins diet  Relevant past medical, surgical, family and social history reviewed. Allergies and medications reviewed and updated. History  Smoking Status  . Former Smoker  . Packs/day: 2.00  . Years: 30.00  . Types: Cigarettes  . Quit date: 12/06/2006  Smokeless Tobacco  . Never Used   ROS: Per HPI   Objective:    BP 135/73   Pulse 74   Temp 97.4 F (36.3 C) (Oral)   Ht 5\' 6"  (1.676 m)   Wt 244 lb (110.7 kg)   BMI 39.38 kg/m   Wt Readings from Last 3 Encounters:  09/29/16 244 lb (110.7 kg)  06/28/16 231 lb (104.8 kg)  06/19/16 242 lb 9.6 oz (110 kg)    Gen: NAD, alert, cooperative with exam, NCAT EYES: EOMI, no conjunctival injection, or no icterus ENT:  TMs impacted by cerumen b/l, OP without erythema LYMPH: no cervical LAD CV: NRRR, normal S1/S2 Resp: CTABL, no wheezes, normal WOB Abd: +BS, soft, NTND. Ext: No edema, warm, diminished pulses b/l feet Neuro: Alert and oriented, sensation in feet intact to monofilament, light touch MSK: normal muscle bulk  Assessment & Plan:  [name] was seen today for follow-up.  Diagnoses and all orders for this visit:  Type 2 diabetes mellitus without complication, without long-term current use of insulin (HCC) A1c 8.6 On metformin and glipizide Many dietary indiscretions, candy corn regularly  now Discussed healthy snack options, avoiding heavily sugary foods OK to eat complex carbohydrates -     Bayer DCA Hb A1c Waived  Essential hypertension Adequate control today, continue  Obstructive sleep apnea Cont CPAP machine  Chronic obstructive pulmonary disease, unspecified COPD type ([place]) Well controlled, cont current meds  BMI 39.0-39.9,adult Discussed lifestyle changes, diet changes as above Pt thinking about trying [employer] Discussed cutting out many of the sugary foods he regularly consumes would be great benefit, adkins diet overly restrictive of healthy carbohydrates and vegetables  Chronic hypoxemic respiratory failure (HCC) On oxygen at night with CPAP  Bilateral impacted cerumen Stop qtips Use debrox drops several days a week  Follow up plan: Return in about 3 months (around 12/30/2016). [name], MD [facility]

## 2016-11-03 ENCOUNTER — Ambulatory Visit (INDEPENDENT_AMBULATORY_CARE_PROVIDER_SITE_OTHER): Payer: Medicare Other | Admitting: Emergency Medicine

## 2016-11-03 ENCOUNTER — Encounter: Payer: Self-pay | Admitting: Emergency Medicine

## 2016-11-03 DIAGNOSIS — Z23 Encounter for immunization: Secondary | ICD-10-CM | POA: Diagnosis not present

## 2016-11-03 DIAGNOSIS — G4733 Obstructive sleep apnea (adult) (pediatric): Secondary | ICD-10-CM

## 2016-11-03 DIAGNOSIS — J449 Chronic obstructive pulmonary disease, unspecified: Secondary | ICD-10-CM | POA: Diagnosis not present

## 2016-11-03 NOTE — Addendum Note (Signed)
Addended by: Desmond Dike C on: 11/03/2016 03:34 PM   Modules accepted: Orders

## 2016-11-03 NOTE — Assessment & Plan Note (Signed)
Stable disease. No exacerbations. Continue his current Anoro and albuterol as needed. Continue oxygen with exertion. He is due for the Pneumovax and we will give this today. Prevnar 13 is up-to-date. I offered him the flu shot and he declined.

## 2016-11-03 NOTE — Assessment & Plan Note (Signed)
Continue current CPAP. Start bleeding and oxygen at 2 L/m

## 2016-11-03 NOTE — Progress Notes (Signed)
Subjective:    Patient ID: Brent Snow, male    DOB: [birth year], 75 y.o.   MRN: [id number]  HPI 75 yo former smoker, HTN, OSA, DM.  Last seen by me in 2010 for COPD.  He never really felt that Spiriva did much, he remains on Symbicort. He has not had any flares for 5 years. He has noticed more SOB, bothers him with mild exertion, walking. He occasionally hears wheeze. Rare cough. Minimal mucous.   He wears CPAP rarely. Doesn't average once a week. [facility] is provider.    ROV 03/27/14 -- follows for COPD, OSA/OHS. He has been wearing auto-set device for 3 weeks, is due to have data interpreted tomorrow.  He is now on Anoro, feels that he did not lose any ground off symbicort but not necessarily better.   ROV 06/20/14 -- follows for COPD, OSA/OHS, exertional hypoxemia. He has been doing well. He is reliable with his CPAP + O2 bled in.  He has [employer] and is interested in a Marine scientist. His breathing is better since he started using o2 more reliably. He is using albuterol 2 x a day. He is on Anoro qd. He needs full PFT in order to get into pulm rehab.   ROV 10/24/14 -- follow-up visit for COPD, obstructive sleep apnea and exertional hypoxemia. He wears CPAP reliably. Underwent PFT 07/13/14 , FEV1 1.26 (46%). He is scheduled to go to pulm rehab at [facility] starting 12/4.  Has been doing well since last time with some good and bad days. He has some occasional wheeze. He wears his O2 prn - whenever he feels SOB, has to walk an incline. No flares since last time.    ROV 09/02/15 -- follow-up visit for COPD with severe airflow limitation, obstructive sleep apnea and exertional hypoxemia. He was last seen in April 2016 by TP.  He has been more limited since April. Less exertional tolerance. On Anoro and ProAir. He hears wheezing w exertion. His wt has been stable. He is wearing CPAP reliably. He did pulm rehab, wasn't sure that it helped him any. He stays active, but doesn;t walk as much as he used to.  His SpO2 is in the 80's many times a day, especially when on his pulsed O2, portable concentrator.  Using 2.5L but not reliably.   ROV 11/25/15 -- follow-up visit for severe obstructive lung disease, COPD,obstructive sleep apnea and exertional hypoxemia. He has been doing well, was able to go to [facility] recently, was able to walk around. He did need his albuterol some. He does not wear his oxygen out. He is wearing his CPAP reliably. He is on Anoro, tolerates well, believes it works. He has daily wheeze. Minimal congestion and cough.   ROV 03/22/16 -- patient has a history of severe COPD, obstructive sleep apnea and exertional hypoxemia. He has not been reliable with his oxygen with exertion. He has been doing fairly well - does have exertional SOB. He is complaint w CPAP. He does use his O2 in the afternoon, notices a difference when he uses it.  He has had some nasal congestion lately, suspects allergies. He is on Anoro, feels that he is benefiting. Uses proair about several times a day. Not coughing.    ROV 11/03/16 -- this follow-up visit for severe COPD, exertional hypoxemia, obstructive sleep apnea. He is here for a regular follow up. He reports that he has been doing well since last time. Some exertional dyspnea, especially w heavy exertion. He has  played some golf - makes the next day rough. No pred or abx. He uses o2 prn for dyspnea. Takes ProAir about 1-3x a day. He is on Anoro and tolerates this. Good compliance with his CPAP.    Review of Systems  Constitutional: Negative for fever and unexpected weight change.  HENT: Negative for congestion, dental problem, ear pain, nosebleeds, postnasal drip, rhinorrhea, sinus pressure, sneezing, sore throat and trouble swallowing.   Eyes: Negative for redness and itching.  Respiratory: Positive for shortness of breath. Negative for cough, chest tightness and wheezing.   Cardiovascular: Negative for palpitations and leg swelling.  Gastrointestinal: Negative  for nausea and vomiting.  Genitourinary: Negative for dysuria.  Musculoskeletal: Negative for joint swelling.       Pain and stiffness  Skin: Negative for rash.  Neurological: Negative for headaches.  Hematological: Does not bruise/bleed easily.  Psychiatric/Behavioral: Negative for dysphoric mood and suicidal ideas. The patient is not nervous/anxious.       Objective:   Physical Exam Vitals:   11/03/16 1449 11/03/16 1450  BP:  124/82  BP Location:  Left Arm  Cuff Size:  Normal  Pulse:  86  SpO2:  92%  Weight: 240 lb 9.6 oz (109.1 kg)   Height: 5\' 6"  (1.676 m)    Gen: Pleasant, overweight, in no distress,  normal affect  ENT: No lesions,  mouth clear,  oropharynx clear, no postnasal drip  Neck: No JVD, no TMG, no carotid bruits  Lungs: No use of accessory muscles, clear without rales or rhonchi  Cardiovascular: RRR, heart sounds normal, no murmur or gallops, no peripheral edema  Musculoskeletal: No deformities, no cyanosis or clubbing  Neuro: alert, non focal  Skin: Warm, no lesions or rashes     Assessment & Plan:  Obstructive sleep apnea Continue current CPAP. Start bleeding and oxygen at 2 L/m  COPD (chronic obstructive pulmonary disease) Stable disease. No exacerbations. Continue his current Anoro and albuterol as needed. Continue oxygen with exertion. He is due for the Pneumovax and we will give this today. Prevnar 13 is up-to-date. I offered him the flu shot and he declined.   Baltazar Apo, MD, PhD 11/03/2016, 3:28 PM [facility] [phone] or if no answer [phone]

## 2016-11-03 NOTE — Patient Instructions (Signed)
Pneumovax today Please continue your current medications Please continue CPAP every night. Start adding your oxygen at 2L/min to the CPAP for sleep Use oxygen with exertion as you have been doing.  Follow with Dr Lamonte Sakai in 6 months or sooner if you have any problems

## 2016-12-08 ENCOUNTER — Other Ambulatory Visit: Payer: Self-pay | Admitting: Pediatrics

## 2016-12-08 DIAGNOSIS — I10 Essential (primary) hypertension: Secondary | ICD-10-CM

## 2016-12-30 ENCOUNTER — Encounter: Payer: Self-pay | Admitting: Pediatrics

## 2016-12-30 ENCOUNTER — Ambulatory Visit (INDEPENDENT_AMBULATORY_CARE_PROVIDER_SITE_OTHER): Payer: Medicare Other | Admitting: Pediatrics

## 2016-12-30 VITALS — BP 109/62 | HR 84 | Temp 97.1°F | Ht 66.0 in | Wt 238.2 lb

## 2016-12-30 DIAGNOSIS — J449 Chronic obstructive pulmonary disease, unspecified: Secondary | ICD-10-CM

## 2016-12-30 DIAGNOSIS — E119 Type 2 diabetes mellitus without complications: Secondary | ICD-10-CM

## 2016-12-30 DIAGNOSIS — G4733 Obstructive sleep apnea (adult) (pediatric): Secondary | ICD-10-CM | POA: Diagnosis not present

## 2016-12-30 DIAGNOSIS — I10 Essential (primary) hypertension: Secondary | ICD-10-CM

## 2016-12-30 DIAGNOSIS — J9611 Chronic respiratory failure with hypoxia: Secondary | ICD-10-CM | POA: Diagnosis not present

## 2016-12-30 LAB — BMP8+EGFR
BUN / CREAT RATIO: 14 (ref 10–24)
BUN: 14 mg/dL (ref 8–27)
CO2: 25 mmol/L (ref 18–29)
CREATININE: 1.03 mg/dL (ref 0.76–1.27)
Calcium: 8.9 mg/dL (ref 8.6–10.2)
Chloride: 96 mmol/L (ref 96–106)
GFR calc Af Amer: 83 mL/min/{1.73_m2} (ref >59)
GFR, EST NON AFRICAN AMERICAN: 72 mL/min/{1.73_m2} (ref >59)
Glucose: 206 mg/dL — ABNORMAL HIGH (ref 65–99)
Potassium: 4.6 mmol/L (ref 3.5–5.2)
SODIUM: 137 mmol/L (ref 134–144)

## 2016-12-30 LAB — BAYER DCA HB A1C WAIVED: HB A1C (BAYER DCA - WAIVED): >14 % — ABNORMAL HIGH (ref <7.0)

## 2016-12-30 MED ORDER — SITAGLIPTIN PHOSPHATE 100 MG PO TABS: 100.0000 mg | ORAL_TABLET | Freq: Every day | ORAL | 1 refills | Status: DC

## 2016-12-30 MED ORDER — LISINOPRIL 20 MG PO TABS: 20.0000 mg | ORAL_TABLET | Freq: Every day | ORAL | 0 refills | Status: DC

## 2016-12-30 NOTE — Patient Instructions (Addendum)
Take blood sugar level every morning and a few times a week before eating dinner Write these numbers down with date and bring to next clinic appointment Bring your glucometer to next clinic appointment Goal is in the 100s for now Avoid all sugary beverages including juice Avoid desserts and sugary foods  Take metformin 1000mg  twice a day Take sitagliptan Celesta Gentile) 100mg  once a day Take glipizide 10mg  once a day

## 2016-12-30 NOTE — Progress Notes (Signed)
  Subjective:   Patient ID: Brent Snow, male    DOB: 12-Nov-1941, 76 y.o.   MRN: [id number] CC: Follow-up (3 month) DM2 HPI: Brent Snow is a 76 y.o. male presenting for Follow-up (3 month)  DM2: Drinks diet drinks Metformin caused him to feel depressed Had eye exam June 2017 at [facility]  HTN: No chest pain, no lightheadedness, no SOB Taking meds regularly  Breathing continues to be hard with long distances Uses albuterol several times a day as needed  Relevant past medical, surgical, family and social history reviewed. Allergies and medications reviewed and updated. History  Smoking Status  . Former Smoker  . Packs/day: 2.00  . Years: 30.00  . Types: Cigarettes  . Quit date: 12/06/2006  Smokeless Tobacco  . Never Used   ROS: Per HPI   Objective:    BP 109/62   Pulse 84   Temp 97.1 F (36.2 C) (Oral)   Ht _0  (1.676 m)   Wt 238 lb 3.2 oz (108 kg)   BMI 38.45 kg/m   Wt Readings from Last 3 Encounters:  12/30/16 238 lb 3.2 oz (108 kg)  11/03/16 240 lb 9.6 oz (109.1 kg)  09/29/16 244 lb (110.7 kg)    Gen: NAD, alert, cooperative with exam, NCAT EYES: EOMI, no conjunctival injection, or no icterus ENT:  TMs pearly gray b/l, OP without erythema LYMPH: no cervical LAD CV: NRRR, normal S1/S2 Resp: CTABL, no wheezes, normal WOB Abd: +BS, soft, NTND. no guarding or organomegaly Ext: No edema, warm Neuro: Alert and oriented  Assessment & Plan:  Brent Snow was seen today for follow-up mult. Med probl  Diagnoses and all orders for this visit:  Essential hypertension Well controlled No sx of hypotension Cont current meds -     BMP8+EGFR -     Bayer DCA Hb A1c Waived -     lisinopril (PRINIVIL,ZESTRIL) 20 MG tablet; Take 1 tablet (20 mg total) by mouth daily.  Type 2 diabetes mellitus without complication, without long-term current use of insulin (HCC) Uncontrolled, A1c >14 Pt says has been snacking more on sweets through the  holidays [place]ant about not wanting to start insulin yet Will decrease sugar intake Cont SU, increase metformin to 1064m BID, start januvia Check BGLs every morning, before dinner several times a week -     BMP8+EGFR -     Bayer DCA Hb A1c Waived -     sitaGLIPtin (JANUVIA) 100 MG tablet; Take 1 tablet (100 mg total) by mouth daily.  Obstructive sleep apnea Stable, cont CPAP  Chronic obstructive pulmonary disease, unspecified COPD type (HCC) Stable, cont meds  Chronic hypoxemic respiratory failure (HCC) Uses oxygen prn with exertion  Follow up plan: Return See Tammy in 4 weeks, Dr. VEvette Doffingin 8 weeks. CAssunta Found MD [place]

## 2016-12-31 MED ORDER — METFORMIN HCL 500 MG PO TABS: 1000.0000 mg | ORAL_TABLET | Freq: Two times a day (BID) | ORAL | 3 refills | Status: DC

## 2016-12-31 NOTE — Addendum Note (Signed)
Addended by: Eustaquio Maize on: 12/31/2016 09:18 PM   Modules accepted: Orders

## 2017-01-03 ENCOUNTER — Other Ambulatory Visit: Payer: Self-pay | Admitting: Pediatrics

## 2017-01-04 MED ORDER — ONETOUCH ULTRASOFT LANCETS MISC: 2 refills | Status: DC

## 2017-01-04 MED ORDER — GLUCOSE BLOOD VI STRP: ORAL_STRIP | 12 refills | Status: DC

## 2017-01-04 NOTE — Telephone Encounter (Signed)
done

## 2017-01-11 ENCOUNTER — Ambulatory Visit (INDEPENDENT_AMBULATORY_CARE_PROVIDER_SITE_OTHER): Payer: Medicare Other | Admitting: Urology

## 2017-01-11 DIAGNOSIS — N5201 Erectile dysfunction due to arterial insufficiency: Secondary | ICD-10-CM | POA: Diagnosis not present

## 2017-01-11 DIAGNOSIS — R3915 Urgency of urination: Secondary | ICD-10-CM | POA: Diagnosis not present

## 2017-01-11 DIAGNOSIS — N471 Phimosis: Secondary | ICD-10-CM | POA: Diagnosis not present

## 2017-01-13 ENCOUNTER — Other Ambulatory Visit: Payer: Self-pay | Admitting: Urology

## 2017-01-24 ENCOUNTER — Encounter (HOSPITAL_BASED_OUTPATIENT_CLINIC_OR_DEPARTMENT_OTHER): Payer: Self-pay | Admitting: *Deleted

## 2017-01-24 NOTE — Progress Notes (Addendum)
NPO AFTER MN.  ARRIVE AT 0830.  NEEDS ISTAT 8 AND EKG.  WILL TAKE ANONO INHALER AM DOS W/ SIPS OF WATER AND BRING RESCUE INHALER/ CPAP .  PT STATED HAD BEEN TAKING METFORMIN, SAYS HE GOES BETWEEN TAKING METFORMIN AND GLIPIZIDE. AFTER EXPLAINDING TO PT THE PER HIS PCP NOTE HOW THESE ARE PRESCRIBED AND WHAT EACH ONE DO, PT VERBALIZED UNDERSTANDING AND UNDERSTOOD MY ADVISEMENT TO START BACK METFORMIN AS PRESCRIBED , STATED HE WOULD SINCE HIS PROCEDURE COULD BE CANCELLED IF IS BLOOD SUGAR TO HIGH.

## 2017-01-26 ENCOUNTER — Ambulatory Visit: Payer: Medicare Other | Admitting: Pharmacist

## 2017-01-26 NOTE — H&P (Signed)
Urology History and Physical Exam  CC: Foreskin problems  HPI: 76 year old male presents for circumcision for phimosis that is troublesome.  PMH: Past Medical History:  Diagnosis Date  . Arthritis   . COPD, severe ([place])    pulmologist-  dr byrum  . Dyspnea on exertion    prescribed to use O2 via [place]--- per pt he does not use O2 he just take 1-2 puffs of rescue inhaler  . Hiatal hernia   . 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 Hypertension   . Hypoxemic respiratory failure, chronic ([place])    pulmologist-  dr byrum-- prescribed supplemental O2 @ 2L via [place]   . OSA on CPAP    and added O2 @ 2L via [place] w/ cpap  . Phimosis   . S/P balloon dilatation of esophageal stricture    multiple   . Type 2 diabetes mellitus ([place])    followed by pcp--  per note uncontrolled w/ last A1c >14 on 12-30-2016  . Wears contact lenses     PSH: Past Surgical History:  Procedure Laterality Date  . BACK SURGERY    . KNEE ARTHROSCOPY Right 2008  . LUMBAR SPINE SURGERY  1977  approx.  . TONSILLECTOMY AND ADENOIDECTOMY  age 5  . TRANSTHORACIC ECHOCARDIOGRAM  09/12/2015   mild LVH,  ef 65-70%,  grade 1 diastolic dysfunction/  mild RAE    Allergies: No Known Allergies  Medications: No prescriptions prior to admission.     Social History: Social History   Social History  . Marital status: Married    Spouse name: N/A  . Number of children: N/A  . Years of education: N/A   Occupational History  . Not on file.   Social History Main Topics  . Smoking status: Former Smoker    Packs/day: 2.00    Years: 40.00    Types: Cigarettes    Quit date: 12/06/2006  . Smokeless tobacco: Never Used  . Alcohol use 0.0 oz/week     Comment: VERY LITTLE  . Drug use: No  . Sexual activity: Not on file   Other Topics Concern  . Not on file   Social History Narrative  . No narrative on file    Family History: Family History  Problem  Relation Age of Onset  . Asthma Mother   . Cancer Maternal Grandmother     stomach    Review of Systems: Positive: SOB Negative:   A further 10 point review of systems was negative except what is listed in the HPI.                  Physical Exam: @VITALS2 @ General: No acute distress.  Awake. Head:  Normocephalic.  Atraumatic. ENT:  EOMI.  Mucous membranes moist Neck:  Supple.  No lymphadenopathy. CV:  S1 present. S2 present. Regular rate. Pulmonary: Equal effort bilaterally.  Clear to auscultation bilaterally. Abdomen: Soft.  Non tender to palpation. Skin:  Normal turgor.  No visible rash. Extremity: No gross deformity of bilateral upper extremities.  No gross deformity of                             lower extremities. Neurologic: Alert. Appropriate mood.  Penis:  Uncircumcised.  No lesions. Phimosis Urethra: Orthotopic meatus. Scrotum: No lesions.  No ecchymosis.  No erythema. Testicles: Descended bilaterally.  No masses  bilaterally. Epididymis: Palpable bilaterally. Nontender to palpation.  Studies:  No results for input(s): HGB, WBC, PLT in the last 72 hours.  No results for input(s): NA, K, CL, CO2, BUN, CREATININE, CALCIUM, GFRNONAA, GFRAA in the last 72 hours.  Invalid input(s): MAGNESIUM   No results for input(s): INR, APTT in the last 72 hours.  Invalid input(s): PT   Invalid input(s): ABG    Assessment:  Phimosis  Plan: Circumcision

## 2017-01-27 ENCOUNTER — Encounter (HOSPITAL_BASED_OUTPATIENT_CLINIC_OR_DEPARTMENT_OTHER)
Admission: RE | Disposition: A | Discharge status: Home / Self Care | Payer: Self-pay | Source: Ambulatory Visit | Attending: Urology

## 2017-01-27 ENCOUNTER — Ambulatory Visit (HOSPITAL_BASED_OUTPATIENT_CLINIC_OR_DEPARTMENT_OTHER): Payer: Medicare Other | Admitting: Anesthesiology

## 2017-01-27 ENCOUNTER — Encounter (HOSPITAL_BASED_OUTPATIENT_CLINIC_OR_DEPARTMENT_OTHER): Payer: Self-pay | Admitting: Anesthesiology

## 2017-01-27 ENCOUNTER — Ambulatory Visit (HOSPITAL_BASED_OUTPATIENT_CLINIC_OR_DEPARTMENT_OTHER)
Admission: RE | Admit: 2017-01-27 | Discharge: 2017-01-27 | Disposition: A | Discharge status: Home / Self Care | Payer: Medicare Other | Source: Ambulatory Visit | Attending: Urology | Admitting: Urology

## 2017-01-27 DIAGNOSIS — I10 Essential (primary) hypertension: Secondary | ICD-10-CM | POA: Insufficient documentation

## 2017-01-27 DIAGNOSIS — G4733 Obstructive sleep apnea (adult) (pediatric): Secondary | ICD-10-CM | POA: Insufficient documentation

## 2017-01-27 DIAGNOSIS — E119 Type 2 diabetes mellitus without complications: Secondary | ICD-10-CM | POA: Diagnosis not present

## 2017-01-27 DIAGNOSIS — J449 Chronic obstructive pulmonary disease, unspecified: Secondary | ICD-10-CM | POA: Diagnosis not present

## 2017-01-27 DIAGNOSIS — K449 Diaphragmatic hernia without obstruction or gangrene: Secondary | ICD-10-CM | POA: Insufficient documentation

## 2017-01-27 DIAGNOSIS — J9611 Chronic respiratory failure with hypoxia: Secondary | ICD-10-CM | POA: Diagnosis not present

## 2017-01-27 DIAGNOSIS — Z9981 Dependence on supplemental oxygen: Secondary | ICD-10-CM | POA: Insufficient documentation

## 2017-01-27 DIAGNOSIS — N471 Phimosis: Secondary | ICD-10-CM | POA: Insufficient documentation

## 2017-01-27 DIAGNOSIS — Z87891 Personal history of nicotine dependence: Secondary | ICD-10-CM | POA: Diagnosis not present

## 2017-01-27 DIAGNOSIS — N477 Other inflammatory diseases of prepuce: Secondary | ICD-10-CM | POA: Diagnosis not present

## 2017-01-27 HISTORY — DX: Obstructive sleep apnea (adult) (pediatric): Z99.89

## 2017-01-27 HISTORY — DX: Chronic obstructive pulmonary disease, unspecified: J44.9

## 2017-01-27 HISTORY — DX: Type 2 diabetes mellitus without complications: E11.9

## 2017-01-27 HISTORY — DX: Chronic respiratory failure with hypoxia: J96.11

## 2017-01-27 HISTORY — DX: Other forms of dyspnea: R06.09

## 2017-01-27 HISTORY — DX: Essential (primary) hypertension: I10

## 2017-01-27 HISTORY — DX: Presence of spectacles and contact lenses: Z97.3

## 2017-01-27 HISTORY — DX: Other specified postprocedural states: Z98.890

## 2017-01-27 HISTORY — DX: Obstructive sleep apnea (adult) (pediatric): G47.33

## 2017-01-27 HISTORY — DX: Diaphragmatic hernia without obstruction or gangrene: K44.9

## 2017-01-27 HISTORY — DX: Personal history of other diseases of the respiratory system: Z87.09

## 2017-01-27 HISTORY — DX: Phimosis: N47.1

## 2017-01-27 HISTORY — DX: Unspecified osteoarthritis, unspecified site: M19.90

## 2017-01-27 HISTORY — DX: Dyspnea, unspecified: R06.00

## 2017-01-27 HISTORY — PX: CIRCUMCISION: SHX1350

## 2017-01-27 LAB — POCT I-STAT, CHEM 8
BUN: 17 mg/dL (ref 6–20)
CREATININE: 0.9 mg/dL (ref 0.61–1.24)
Calcium, Ion: 1.26 mmol/L (ref 1.15–1.40)
Chloride: 103 mmol/L (ref 101–111)
Glucose, Bld: 173 mg/dL — ABNORMAL HIGH (ref 65–99)
HEMATOCRIT: 54 % — AB (ref 39.0–52.0)
HEMOGLOBIN: 18.4 g/dL — AB (ref 13.0–17.0)
Potassium: 4.5 mmol/L (ref 3.5–5.1)
SODIUM: 142 mmol/L (ref 135–145)
TCO2: 26 mmol/L (ref 0–100)

## 2017-01-27 LAB — GLUCOSE, CAPILLARY: GLUCOSE-CAPILLARY: 153 mg/dL — AB (ref 65–99)

## 2017-01-27 SURGERY — CIRCUMCISION, ADULT
Anesthesia: Monitor Anesthesia Care | Site: Penis

## 2017-01-27 MED ORDER — LIDOCAINE HCL 1 % IJ SOLN
INTRAMUSCULAR | Status: DC | PRN
  Administered 2017-01-27: 14 mL

## 2017-01-27 MED ORDER — ONDANSETRON HCL 4 MG/2ML IJ SOLN
4.0000 mg | Freq: Once | INTRAMUSCULAR | Status: DC | PRN
  Filled 2017-01-27: qty 2

## 2017-01-27 MED ORDER — LIDOCAINE 2% (20 MG/ML) 5 ML SYRINGE
INTRAMUSCULAR | Status: DC | PRN
Start: 2017-01-27 — End: 2017-01-27
  Administered 2017-01-27: 50 mg via INTRAVENOUS

## 2017-01-27 MED ORDER — MEPERIDINE HCL 25 MG/ML IJ SOLN
6.2500 mg | INTRAMUSCULAR | Status: DC | PRN
  Filled 2017-01-27: qty 1

## 2017-01-27 MED ORDER — ONDANSETRON HCL 4 MG/2ML IJ SOLN
INTRAMUSCULAR | Status: DC | PRN
  Administered 2017-01-27: 4 mg via INTRAVENOUS

## 2017-01-27 MED ORDER — CEFAZOLIN SODIUM-DEXTROSE 2-4 GM/100ML-% IV SOLN
2.0000 g | INTRAVENOUS | Status: AC
  Administered 2017-01-27: 2 g via INTRAVENOUS
  Filled 2017-01-27: qty 100

## 2017-01-27 MED ORDER — TRAMADOL HCL 50 MG PO TABS: 50.0000 mg | ORAL_TABLET | Freq: Four times a day (QID) | ORAL | 0 refills | Status: DC | PRN

## 2017-01-27 MED ORDER — BUPIVACAINE HCL 0.25 % IJ SOLN
INTRAMUSCULAR | Status: DC | PRN
  Administered 2017-01-27: 14 mL

## 2017-01-27 MED ORDER — HYDROMORPHONE HCL 1 MG/ML IJ SOLN
0.2500 mg | INTRAMUSCULAR | Status: DC | PRN
  Filled 2017-01-27: qty 0.5

## 2017-01-27 MED ORDER — FENTANYL CITRATE (PF) 100 MCG/2ML IJ SOLN
INTRAMUSCULAR | Status: AC
  Filled 2017-01-27: qty 2

## 2017-01-27 MED ORDER — PROPOFOL 500 MG/50ML IV EMUL
INTRAVENOUS | Status: AC
  Filled 2017-01-27: qty 50

## 2017-01-27 MED ORDER — FENTANYL CITRATE (PF) 100 MCG/2ML IJ SOLN
INTRAMUSCULAR | Status: DC | PRN
Start: 2017-01-27 — End: 2017-01-27
  Administered 2017-01-27: 25 ug via INTRAVENOUS

## 2017-01-27 MED ORDER — ONDANSETRON HCL 4 MG/2ML IJ SOLN
INTRAMUSCULAR | Status: AC
  Filled 2017-01-27: qty 2

## 2017-01-27 MED ORDER — LIDOCAINE 2% (20 MG/ML) 5 ML SYRINGE
INTRAMUSCULAR | Status: AC
  Filled 2017-01-27: qty 5

## 2017-01-27 MED ORDER — MIDAZOLAM HCL 5 MG/5ML IJ SOLN
INTRAMUSCULAR | Status: DC | PRN
  Administered 2017-01-27 (×2): 0.5 mg via INTRAVENOUS

## 2017-01-27 MED ORDER — PROPOFOL 500 MG/50ML IV EMUL
INTRAVENOUS | Status: DC | PRN
  Administered 2017-01-27: 75 ug/kg/min via INTRAVENOUS

## 2017-01-27 MED ORDER — MIDAZOLAM HCL 2 MG/2ML IJ SOLN
INTRAMUSCULAR | Status: AC
  Filled 2017-01-27: qty 2

## 2017-01-27 MED ORDER — LACTATED RINGERS IV SOLN
INTRAVENOUS | Status: DC
  Administered 2017-01-27 (×2): via INTRAVENOUS
  Filled 2017-01-27: qty 1000

## 2017-01-27 MED ORDER — CEFAZOLIN IN D5W 1 GM/50ML IV SOLN
1.0000 g | INTRAVENOUS | Status: DC
  Filled 2017-01-27: qty 50

## 2017-01-27 MED ORDER — CEFAZOLIN IN D5W 1 GM/50ML IV SOLN
INTRAVENOUS | Status: AC
  Filled 2017-01-27: qty 50

## 2017-01-27 SURGICAL SUPPLY — 36 items
BANDAGE CO FLEX L/F 1IN X 5YD (GAUZE/BANDAGES/DRESSINGS) ×3 IMPLANT
BLADE SURG 15 STRL LF DISP TIS (BLADE) ×1 IMPLANT
BLADE SURG 15 STRL SS (BLADE) ×3
BNDG COHESIVE 1X5 TAN STRL LF (GAUZE/BANDAGES/DRESSINGS) ×2 IMPLANT
BNDG CONFORM 2 STRL LF (GAUZE/BANDAGES/DRESSINGS) ×3 IMPLANT
COVER BACK TABLE 60X90IN (DRAPES) ×3 IMPLANT
COVER MAYO STAND STRL (DRAPES) ×3 IMPLANT
DRAPE LAPAROTOMY 100X72 PEDS (DRAPES) ×3 IMPLANT
ELECT NDL TIP 2.8 STRL (NEEDLE) IMPLANT
ELECT NEEDLE TIP 2.8 STRL (NEEDLE) IMPLANT
ELECT REM PT RETURN 9FT ADLT (ELECTROSURGICAL) ×3
ELECTRODE REM PT RTRN 9FT ADLT (ELECTROSURGICAL) ×1 IMPLANT
GAUZE VASELINE 1X8 (GAUZE/BANDAGES/DRESSINGS) ×2 IMPLANT
GLOVE BIO SURGEON STRL SZ8 (GLOVE) ×3 IMPLANT
GLOVE BIOGEL PI IND STRL 7.0 (GLOVE) IMPLANT
GLOVE BIOGEL PI IND STRL 7.5 (GLOVE) IMPLANT
GLOVE BIOGEL PI INDICATOR 7.0 (GLOVE) ×4
GLOVE BIOGEL PI INDICATOR 7.5 (GLOVE) ×2
GOWN STRL REUS W/ TWL XL LVL3 (GOWN DISPOSABLE) ×1 IMPLANT
GOWN STRL REUS W/TWL LRG LVL3 (GOWN DISPOSABLE) ×4 IMPLANT
GOWN STRL REUS W/TWL XL LVL3 (GOWN DISPOSABLE) ×3
GOWN W/COTTON TOWEL STD LRG (GOWNS) ×1 IMPLANT
GOWN XL W/COTTON TOWEL STD (GOWNS) ×1 IMPLANT
KIT RM TURNOVER CYSTO AR (KITS) ×3 IMPLANT
MANIFOLD NEPTUNE II (INSTRUMENTS) IMPLANT
NEEDLE HYPO 22GX1.5 SAFETY (NEEDLE) ×3 IMPLANT
NS IRRIG 500ML POUR BTL (IV SOLUTION) ×3 IMPLANT
PACK BASIN DAY SURGERY FS (CUSTOM PROCEDURE TRAY) ×3 IMPLANT
PENCIL BUTTON HOLSTER BLD 10FT (ELECTRODE) ×3 IMPLANT
SUT CHROMIC 4 0 RB 1X27 (SUTURE) ×6 IMPLANT
SUT CHROMIC 5 0 RB 1 27 (SUTURE) IMPLANT
SYR CONTROL 10ML LL (SYRINGE) ×3 IMPLANT
TOWEL OR 17X24 6PK STRL BLUE (TOWEL DISPOSABLE) ×6 IMPLANT
TUBE CONNECTING 12'X1/4 (SUCTIONS)
TUBE CONNECTING 12X1/4 (SUCTIONS) IMPLANT
WATER STERILE IRR 500ML POUR (IV SOLUTION) ×3 IMPLANT

## 2017-01-27 NOTE — Op Note (Signed)
Preoperative diagnosis: Phimosis Postoperative diagnosis: Same  Procedure: Circumcision Surgeon: Lillette Boxer. Austyn Perriello, MD. Anesthesia: Gen. with penile block Specimen: foreskin Complications: none [id number]  Indications: The patient was recently evaluated for phimosis. All risks and benefits of circumcision discussed. Full informed consent obtained. The patient now presents for definitive procedure.  Technique and findings: The patient was brought to the operating room. Successful induction of MAC.  The patient was then prepped and draped in usual manner. Appropriate surgical timeout was performed. A penile block was then performed with 20cc of quarter percent plain Marcaine/1% plain lidocaine. Proximal and distal incision sites were marked with a pen, and appropriate circumferential incisions were created. The sleeve of redundant skin was removed with the bovie. Hemostatis was achieved using electrocautery.Quadrant sutures were placed with interrupted 4-0 chromic, with the phrenular suture being a "U" stitch. In between, the same 4-0 chromic was used to re approximate the skin edges with a running simple stitch.  The incision was wrapped with Xeroform gauze, a plain guaze wrap and Coban dressing. The patient was brought to recovery room in stable condition having had no obvious complications or problems. Sponge and needle counts were correct.

## 2017-01-27 NOTE — Anesthesia Postprocedure Evaluation (Signed)
Anesthesia Post Note  Patient: Brent Snow  Procedure(s) Performed: Procedure(s) (LRB): CIRCUMCISION ADULT (N/A)  Patient location during evaluation: PACU Anesthesia Type: MAC Level of consciousness: awake and alert Pain management: pain level controlled Vital Signs Assessment: post-procedure vital signs reviewed and stable Respiratory status: spontaneous breathing, nonlabored ventilation, respiratory function stable and patient connected to nasal cannula oxygen Cardiovascular status: stable and blood pressure returned to baseline Anesthetic complications: no       Last Vitals:  Vitals:   01/27/17 1115 01/27/17 1130  BP: (!) 160/84 (!) 152/81  Pulse: 76 69  Resp: 20 19  Temp:      Last Pain:  Vitals:   01/27/17 1130  TempSrc:   PainSc: 0-No pain                 Gaetana Kawahara [name]

## 2017-01-27 NOTE — Discharge Instructions (Signed)
Post Anesthesia Home Care Instructions  Activity: Get plenty of rest for the remainder of the day. A responsible adult should stay with you for 24 hours following the procedure.  For the next 24 hours, DO NOT: -Drive a car -Paediatric nurse -Drink alcoholic beverages -Take any medication unless instructed by your physician -Make any legal decisions or sign important papers.  Meals: Start with liquid foods such as gelatin or soup. Progress to regular foods as tolerated. Avoid greasy, spicy, heavy foods. If nausea and/or vomiting occur, drink only clear liquids until the nausea and/or vomiting subsides. Call your physician if vomiting continues.  Special Instructions/Symptoms: Your throat may feel dry or sore from the anesthesia or the breathing tube placed in your throat during surgery. If this causes discomfort, gargle with warm salt water. The discomfort should disappear within 24 hours.  If you had a scopolamine patch placed behind your ear for the management of post- operative nausea and/or vomiting:  1. The medication in the patch is effective for 72 hours, after which it should be removed.  Wrap patch in a tissue and discard in the trash. Wash hands thoroughly with soap and water. 2. You may remove the patch earlier than 72 hours if you experience unpleasant side effects which may include dry mouth, dizziness or visual disturbances. 3. Avoid touching the patch. Wash your hands with soap and water after contact with the patch.   Postoperative instructions for circumcision  Wound:  Remove the dressing the morning after surgery. In most cases your incision will have absorbable sutures that run along the course of your incision and will dissolve within the first 10-20 days. Some will fall out even earlier. Expect some redness as the sutures dissolved but this should occur only around the sutures. If there is generalized redness, especially with increasing pain or swelling, let us  know. The penis will possibly get "black and blue" as the blood in the tissues spread. Sometimes the whole penis will turn colors. The black and blue is followed by a yellow and brown color. In time, all the discoloration will go away.  Diet:  You may return to your normal diet within 24 hours following your surgery. You may note some mild nausea and possibly vomiting the first 6-8 hours following surgery. This is usually due to the side effects of anesthesia, and will disappear quite soon. I would suggest clear liquids and a very light meal the first evening following your surgery.  Activity:  Your physical activity should be restricted the first 48 hours. During that time you should remain relatively inactive, moving about only when necessary. During the first 7-10 days following surgery he should avoid lifting any heavy objects (anything greater than 15 pounds), and avoid strenuous exercise. If you work, ask [place] specifically about your restrictions, both for work and home. We will write a note to your employer if needed.  Ice packs can be placed on and off over the penis for the first 48 hours to help relieve the pain and keep the swelling down. Frozen peas or corn in a ZipLock bag can be frozen, used and re-frozen. Fifteen minutes on and 15 minutes off is a reasonable schedule.  No sexual activity for 1 month.  Hygiene:  You may shower 24 hours after your surgery. Make sure wound is clean and dry afterwards. Tub bathing should be restricted until the seventh day.  Medication:  You will be sent home with some type of pain medication. In many cases  you will be sent home with a narcotic pain pill (Vicodin or Tylox). If the pain is not too bad, you may take either Tylenol (acetaminophen) or Advil (ibuprofen) which contain no narcotic agents, and might be tolerated a little better, with fewer side effects. If the pain medication you are sent home with does not control the pain, you will have to let  us know. Some narcotic pain medications cannot be given or refilled by a phone call to a pharmacy.  Problems you should report to [place]:   Fever of 101.0 degrees Fahrenheit or greater.  Moderate or severe swelling under the skin incision or involving the scrotum.  Drug reaction such as hives, a rash, nausea or vomiting.   Difficulty voiding

## 2017-01-27 NOTE — Anesthesia Preprocedure Evaluation (Addendum)
Anesthesia Evaluation  Patient identified by MRN, date of birth, ID band Patient awake    Reviewed: Allergy & Precautions, NPO status , Patient's Chart, lab work & pertinent test results  Airway Mallampati: II  TM Distance: >3 FB Neck ROM: Full    Dental   Pulmonary sleep apnea , COPD, former smoker,    Pulmonary exam normal        Cardiovascular hypertension, Pt. on medications Normal cardiovascular exam     Neuro/Psych    GI/Hepatic   Endo/Other  diabetes, Type 2, Oral Hypoglycemic Agents  Renal/GU      Musculoskeletal   Abdominal   Peds  Hematology   Anesthesia Other Findings   Reproductive/Obstetrics                             Anesthesia Physical Anesthesia Plan  ASA: III  Anesthesia Plan: MAC   Post-op Pain Management:    Induction: Intravenous  Airway Management Planned: Simple Face Mask  Additional Equipment:   Intra-op Plan:   Post-operative Plan:   Informed Consent: I have reviewed the patients History and Physical, chart, labs and discussed the procedure including the risks, benefits and alternatives for the proposed anesthesia with the patient or authorized representative who has indicated his/her understanding and acceptance.     Plan Discussed with: CRNA and Surgeon  Anesthesia Plan Comments:         Anesthesia Quick Evaluation

## 2017-01-27 NOTE — Anesthesia Procedure Notes (Signed)
Procedure Name: MAC Date/Time: 01/27/2017 10:07 AM Performed by: Lillia Abed Pre-anesthesia Checklist: Patient identified, Emergency Drugs available, Suction available, Patient being monitored and Timeout performed Oxygen Delivery Method: Simple face mask Placement Confirmation: positive ETCO2,  CO2 detector and breath sounds checked- equal and bilateral

## 2017-01-27 NOTE — Transfer of Care (Signed)
Last Vitals:  Vitals:   01/27/17 0818  BP: (!) 184/86  Pulse: 78  Resp: 18  Temp: 36.4 C    Last Pain:  Vitals:   01/27/17 0818  TempSrc: Oral      Patients Stated Pain Goal: 8 (01/27/17 [id number])  Immediate Anesthesia Transfer of Care Note  Patient: Brent Snow  Procedure(s) Performed: Procedure(s) (LRB): CIRCUMCISION ADULT (N/A)  Patient Location: PACU  Anesthesia Type: General  Level of Consciousness: awake, alert  and oriented  Airway & Oxygen Therapy: Patient Spontanous Breathing and Patient connected to face mask oxygen  Post-op Assessment: Report given to PACU RN and Post -op Vital signs reviewed and stable  Post vital signs: Reviewed and stable  Complications: No apparent anesthesia complications

## 2017-01-28 ENCOUNTER — Telehealth: Payer: Self-pay | Admitting: Pharmacist

## 2017-01-28 ENCOUNTER — Encounter (HOSPITAL_BASED_OUTPATIENT_CLINIC_OR_DEPARTMENT_OTHER): Payer: Self-pay | Admitting: Urology

## 2017-01-28 NOTE — Telephone Encounter (Signed)
Called patient to reschedule appointment from 01/26/17 that I misses due to illness.  Patient did not want to reschedule because he reports that his BG average over the last 30 days has been 160 and that he has restarted metformin with improved BG.  He also is going to be out of town 2 times in the next 4 weeks.  He will follow up with PCP as planned March 23rd 2018.  He is encouraged to call if BG is greater than 200 or less than 70 on any occasion or if he has any medication questions.

## 2017-02-01 ENCOUNTER — Ambulatory Visit: Payer: Medicare Other | Admitting: Pharmacist

## 2017-02-25 ENCOUNTER — Ambulatory Visit (INDEPENDENT_AMBULATORY_CARE_PROVIDER_SITE_OTHER): Payer: Medicare Other | Admitting: Pediatrics

## 2017-02-25 ENCOUNTER — Encounter: Payer: Self-pay | Admitting: Pediatrics

## 2017-02-25 VITALS — BP 143/88 | HR 78 | Temp 97.6°F | Ht 66.0 in | Wt 220.0 lb

## 2017-02-25 DIAGNOSIS — I10 Essential (primary) hypertension: Secondary | ICD-10-CM | POA: Diagnosis not present

## 2017-02-25 DIAGNOSIS — E785 Hyperlipidemia, unspecified: Secondary | ICD-10-CM | POA: Diagnosis not present

## 2017-02-25 DIAGNOSIS — Z1211 Encounter for screening for malignant neoplasm of colon: Secondary | ICD-10-CM | POA: Diagnosis not present

## 2017-02-25 DIAGNOSIS — J449 Chronic obstructive pulmonary disease, unspecified: Secondary | ICD-10-CM

## 2017-02-25 DIAGNOSIS — E119 Type 2 diabetes mellitus without complications: Secondary | ICD-10-CM | POA: Diagnosis not present

## 2017-02-25 LAB — BAYER DCA HB A1C WAIVED: HB A1C (BAYER DCA - WAIVED): 9.6 % — ABNORMAL HIGH (ref <7.0)

## 2017-02-25 MED ORDER — SITAGLIPTIN PHOSPHATE 100 MG PO TABS: 100.0000 mg | ORAL_TABLET | Freq: Every day | ORAL | 1 refills | Status: DC

## 2017-02-25 MED ORDER — METFORMIN HCL 500 MG PO TABS: 1000.0000 mg | ORAL_TABLET | Freq: Two times a day (BID) | ORAL | 1 refills | Status: DC

## 2017-02-25 MED ORDER — DULAGLUTIDE 0.75 MG/0.5ML ~~LOC~~ SOAJ: 0.7500 mg | SUBCUTANEOUS | 2 refills | Status: DC

## 2017-02-25 MED ORDER — PRAVASTATIN SODIUM 40 MG PO TABS: 40.0000 mg | ORAL_TABLET | Freq: Every day | ORAL | 3 refills | Status: DC

## 2017-02-25 MED ORDER — ALBUTEROL SULFATE HFA 108 (90 BASE) MCG/ACT IN AERS: 2.0000 | INHALATION_SPRAY | RESPIRATORY_TRACT | 5 refills | Status: DC | PRN

## 2017-02-25 MED ORDER — LISINOPRIL 20 MG PO TABS: 20.0000 mg | ORAL_TABLET | Freq: Every day | ORAL | 0 refills | Status: DC

## 2017-02-25 NOTE — Patient Instructions (Signed)
Keep checking morning blood sugars

## 2017-02-25 NOTE — Progress Notes (Signed)
  Subjective:   Patient ID: Brent Snow, male    DOB: 07/17/1941, 76 y.o.   MRN: [id number] CC: Follow-up (8 week) med problems HPI: Brent Snow is a 76 y.o. male presenting for Follow-up (8 week)  This morning BGL was 151, most numbers between 150 and 180 fasting AM glucose levels Has lost about 18 lbs since last visit Taking metformin and januvia Trying to do better about snacking on candy  Eye exam yesterday Colonoscopy--due, pt declines  Breathing has been at baseline, no wheezing, no change in SOB with exertion  HLD: taking med daily, no s/e  HTN:  Taking meds daily No chest pain, no headaches Not checking regularly at home  Relevant past medical, surgical, family and social history reviewed. Allergies and medications reviewed and updated. History  Smoking Status  . Former Smoker  . Packs/day: 2.00  . Years: 40.00  . Types: Cigarettes  . Quit date: 12/06/2006  Smokeless Tobacco  . Never Used   ROS: Per HPI   Objective:    BP (!) 143/88   Pulse 78   Temp 97.6 F (36.4 C) (Oral)   Ht 5\' 6"  (1.676 m)   Wt 220 lb (99.8 kg)   BMI 35.51 kg/m   Wt Readings from Last 3 Encounters:  02/25/17 220 lb (99.8 kg)  01/27/17 233 lb (105.7 kg)  12/30/16 238 lb 3.2 oz (108 kg)    Gen: NAD, alert, cooperative with exam, NCAT EYES: EOMI, no conjunctival injection, or no icterus CV: NRRR, normal S1/S2, no murmur, distal pulses 2+ b/l Resp: CTABL, no wheezes, normal WOB Abd: +BS, soft, NTND. no guarding or organomegaly Ext: No edema, warm Neuro: Alert and oriented, strength equal b/l UE and LE MSK: normal muscle bulk  Assessment & Plan:  Brent Snow was seen today for follow-up.  Diagnoses and all orders for this visit:  Type 2 diabetes mellitus without complication, without long-term current use of insulin (HCC) A1c improved to 9.6 from >14 after 2 mo Add on GLP-1, stop [place], continue metformin Cont to work on avoiding sugary snacks and treats Check  glucoses every morning -     Bayer DCA Hb A1c Waived -     metFORMIN (GLUCOPHAGE) 500 MG tablet; Take 2 tablets (1,000 mg total) by mouth 2 (two) times daily with a meal. -     Dulaglutide 0.75 MG/0.5ML SOPN; Inject 0.75 mg into the skin once a week.  Chronic obstructive pulmonary disease, unspecified COPD type (HCC) Stable, cont controller med, below as needed -     albuterol (PROAIR HFA) 108 (90 Base) MCG/ACT inhaler; Inhale 2 puffs into the lungs every 4 (four) hours as needed.  Essential hypertension slighlty elevated today, was on low side last visit with no change in meds Check at home, let me know if stays elevated -     lisinopril (PRINIVIL,ZESTRIL) 20 MG tablet; Take 1 tablet (20 mg total) by mouth daily.  Hyperlipidemia, unspecified hyperlipidemia type Stable, cont below -     pravastatin (PRAVACHOL) 40 MG tablet; Take 1 tablet (40 mg total) by mouth daily.  Screen for colon cancer -     Fecal occult blood, imunochemical; Future   Follow up plan: Return in about 3 months (around 05/28/2017). [name], MD [place]

## 2017-02-28 ENCOUNTER — Telehealth: Payer: Self-pay | Admitting: Pediatrics

## 2017-02-28 DIAGNOSIS — E119 Type 2 diabetes mellitus without complications: Secondary | ICD-10-CM

## 2017-02-28 MED ORDER — SITAGLIPTIN PHOSPHATE 100 MG PO TABS: 100.0000 mg | ORAL_TABLET | Freq: Every day | ORAL | 1 refills | Status: DC

## 2017-02-28 NOTE — Telephone Encounter (Signed)
Patient's wife states that they can get the Januvia cheaper through the mail service. Rx sent to the mail service. Spoke with [place] at [phone] about having his Januvia sent out today since he is out for it today and [place] states patient will have to call.  Lm to patient - give him the number to call [phone], also lm for patient to call us back if he needs a smaller amount sent to a local pharmacy.

## 2017-03-15 ENCOUNTER — Ambulatory Visit (INDEPENDENT_AMBULATORY_CARE_PROVIDER_SITE_OTHER): Payer: Medicare Other | Admitting: Urology

## 2017-03-15 DIAGNOSIS — N5201 Erectile dysfunction due to arterial insufficiency: Secondary | ICD-10-CM | POA: Diagnosis not present

## 2017-03-15 DIAGNOSIS — N471 Phimosis: Secondary | ICD-10-CM | POA: Diagnosis not present

## 2017-04-12 ENCOUNTER — Ambulatory Visit (INDEPENDENT_AMBULATORY_CARE_PROVIDER_SITE_OTHER): Payer: Medicare Other | Admitting: Emergency Medicine

## 2017-04-12 ENCOUNTER — Encounter: Payer: Self-pay | Admitting: Emergency Medicine

## 2017-04-12 DIAGNOSIS — J9611 Chronic respiratory failure with hypoxia: Secondary | ICD-10-CM | POA: Diagnosis not present

## 2017-04-12 DIAGNOSIS — J449 Chronic obstructive pulmonary disease, unspecified: Secondary | ICD-10-CM

## 2017-04-12 DIAGNOSIS — G4733 Obstructive sleep apnea (adult) (pediatric): Secondary | ICD-10-CM | POA: Diagnosis not present

## 2017-04-12 NOTE — Assessment & Plan Note (Signed)
Continue CPAP daily at bedtime 

## 2017-04-12 NOTE — Progress Notes (Signed)
   Subjective:    Patient ID: Brent Snow, male    DOB: 1941/01/06, 76 y.o.   MRN: [id number]  HPI 76 year old man with a history of severe COPD, exertional hypoxemia, obstructive sleep apnea, diabetes mellitus. He has chronic exertional hypoxemia, uses oxygen as needed. Chronic bronchodilator is Anoro. Tells me that he wears his CPAP approximately every night - feels a benefit, better rested, less sleepy the next day. He recently went to [facility] and was able to walk a lot, used his O2 when he got back to the room. He feels that his breathing may be a bit better. He has lost a few lbs. No flares, no hospitalizations. He did have some dyspnea after gardening mid-week last week. Uses albuterol, not every day.    Review of Systems  Constitutional: Negative for fever and unexpected weight change.  HENT: Negative for congestion, dental problem, ear pain, nosebleeds, postnasal drip, rhinorrhea, sinus pressure, sneezing, sore throat and trouble swallowing.   Eyes: Negative for redness and itching.  Respiratory: Positive for shortness of breath. Negative for cough, chest tightness and wheezing.   Cardiovascular: Negative for palpitations and leg swelling.  Gastrointestinal: Negative for nausea and vomiting.  Genitourinary: Negative for dysuria.  Musculoskeletal: Negative for joint swelling.       Pain and stiffness  Skin: Negative for rash.  Neurological: Negative for headaches.  Hematological: Does not bruise/bleed easily.  Psychiatric/Behavioral: Negative for dysphoric mood and suicidal ideas. The patient is not nervous/anxious.       Objective:   Physical Exam Vitals:   04/12/17 0941  BP: 130/80  Pulse: 70  SpO2: 90%  Weight: 230 lb (104.3 kg)  Height: 5\' 6"  (1.676 m)   Gen: Pleasant, overweight, in no distress,  normal affect  ENT: No lesions,  mouth clear,  oropharynx clear, no postnasal drip  Neck: No JVD, no TMG, no carotid bruits  Lungs: No use of accessory muscles, clear  without rales or rhonchi  Cardiovascular: RRR, heart sounds normal, no murmur or gallops, no peripheral edema  Musculoskeletal: No deformities, no cyanosis or clubbing  Neuro: alert, non focal  Skin: Warm, no lesions or rashes     Assessment & Plan:  COPD (chronic obstructive pulmonary disease) No exacerbations. Exertional symptoms stable. He does not typically get the flu shot. Discussed this with him.  Please continue your Anoro every day.  Keep albuterol available to use 2 puffs as needed for shortness of breath Follow with Dr Lamonte Sakai in 6 months or sooner if you have any problems  Obstructive sleep apnea Continue CPAP daily at bedtime  Chronic hypoxemic respiratory failure With exertion. His option compliance is unreliable. He uses oxygen when necessary. Usually after he has already become dyspneic and likely desaturated. Discussed this with him today.  Baltazar Apo, MD, PhD 04/12/2017, 9:54 AM [facility] [phone] or if no answer [phone]

## 2017-04-12 NOTE — Assessment & Plan Note (Signed)
With exertion. His option compliance is unreliable. He uses oxygen when necessary. Usually after he has already become dyspneic and likely desaturated. Discussed this with him today.

## 2017-04-12 NOTE — Patient Instructions (Addendum)
Please continue your Anoro every day.  Keep albuterol available to use 2 puffs as needed for shortness of breath Continue your oxygen at 2L/min with exertion.  Wear your CPAP every night Follow with Dr Lamonte Sakai in 6 months or sooner if you have any problems

## 2017-04-12 NOTE — Assessment & Plan Note (Addendum)
No exacerbations. Exertional symptoms stable. He does not typically get the flu shot. Discussed this with him.  Please continue your Anoro every day.  Keep albuterol available to use 2 puffs as needed for shortness of breath Follow with Dr Lamonte Sakai in 6 months or sooner if you have any problems

## 2017-04-28 ENCOUNTER — Ambulatory Visit (INDEPENDENT_AMBULATORY_CARE_PROVIDER_SITE_OTHER): Payer: Medicare Other | Admitting: Pediatrics

## 2017-04-28 ENCOUNTER — Encounter: Payer: Self-pay | Admitting: Pediatrics

## 2017-04-28 VITALS — BP 127/71 | HR 65 | Temp 97.6°F | Ht 66.0 in | Wt 211.0 lb

## 2017-04-28 DIAGNOSIS — Z6834 Body mass index (BMI) 34.0-34.9, adult: Secondary | ICD-10-CM | POA: Diagnosis not present

## 2017-04-28 DIAGNOSIS — I10 Essential (primary) hypertension: Secondary | ICD-10-CM | POA: Diagnosis not present

## 2017-04-28 DIAGNOSIS — J449 Chronic obstructive pulmonary disease, unspecified: Secondary | ICD-10-CM | POA: Diagnosis not present

## 2017-04-28 DIAGNOSIS — E119 Type 2 diabetes mellitus without complications: Secondary | ICD-10-CM

## 2017-04-28 MED ORDER — DULAGLUTIDE 0.75 MG/0.5ML ~~LOC~~ SOAJ: 0.7500 mg | SUBCUTANEOUS | 6 refills | Status: DC

## 2017-04-28 NOTE — Progress Notes (Signed)
  Subjective:   Patient ID: Brent Snow, male    DOB: 1941-12-06, 76 y.o.   MRN: [id number] CC: Follow-up (2 month)  HPI: Brent Snow is a 76 y.o. male presenting for Follow-up (2 month)  BMI elevated: Cutting down on sugar Pleased with weight loss, has been working hard at it [employer] more SOB with exercise after eating   DM2: ran out of trulicity 3 weeks ago, hasnt pick up next Rx Was doing fine taking it, no side effects AM BGLs 10s-140s, a few over 150 but all less than 200 Denies any lows Taking other meds regularly Avoiding sugar more now  Breathing has been fine he says, still using oxygen with exertion Played golf last week, thinks his exercise tolerance is improving with weight loss  HTN: taking meds daily No CP, no HA  Feeling well, no fevers or recent illness  Relevant past medical, surgical, family and social history reviewed. Allergies and medications reviewed and updated. History  Smoking Status  . Former Smoker  . Packs/day: 2.00  . Years: 40.00  . Types: Cigarettes  . Quit date: 12/06/2006  Smokeless Tobacco  . Never Used   ROS: Per HPI   Objective:    BP 127/71   Pulse 65   Temp 97.6 F (36.4 C) (Oral)   Ht 5\' 6"  (1.676 m)   Wt 211 lb (95.7 kg)   BMI 34.06 kg/m   Wt Readings from Last 3 Encounters:  04/28/17 211 lb (95.7 kg)  04/12/17 230 lb (104.3 kg)  02/25/17 220 lb (99.8 kg)    Gen: NAD, alert, cooperative with exam, NCAT EYES: EOMI, no conjunctival injection, or no icterus CV: NRRR, normal S1/S2, no murmur, distal pulses 2+ b/l Resp: CTABL, no wheezes, normal WOB Ext: No edema, warm Neuro: Alert and oriented, strength equal b/l UE and LE, coordination grossly normal MSK: normal muscle bulk  Assessment & Plan:  Brent Snow was seen today for follow-up multiple med problems.  Diagnoses and all orders for this visit:  Type 2 diabetes mellitus without complication, without long-term current use of insulin (HCC) Improving AM  BGLs Due for A1c next visit Cont current meds, avoiding sugar Refilled trulicity, was taking regularly until he ran out per pt -     Microalbumin / creatinine urine ratio  Essential hypertension Adequate control, cont current meds  Chronic obstructive pulmonary disease, unspecified COPD type ([place]) Stable Following on pulm Using oxygen with exertion  BMI 34.0-34.9,adult Congratulated on weight loss, cont avoiding sugar  Follow up plan: 3 mo [name], MD [place]

## 2017-04-29 LAB — MICROALBUMIN / CREATININE URINE RATIO
CREATININE, UR: 88.8 mg/dL
MICROALBUM., U, RANDOM: 49.6 ug/mL
Microalb/Creat Ratio: 55.9 mg/g creat — ABNORMAL HIGH (ref 0.0–30.0)

## 2017-06-24 ENCOUNTER — Telehealth: Payer: Self-pay | Admitting: Pediatrics

## 2017-06-24 DIAGNOSIS — E785 Hyperlipidemia, unspecified: Secondary | ICD-10-CM

## 2017-06-24 DIAGNOSIS — I10 Essential (primary) hypertension: Secondary | ICD-10-CM

## 2017-06-24 DIAGNOSIS — E119 Type 2 diabetes mellitus without complications: Secondary | ICD-10-CM

## 2017-06-24 NOTE — Telephone Encounter (Signed)
What is the name of the medication? Pravastatin, lisinopril, test strips one touch verio  Have you contacted your pharmacy to request a refill? yes  Which pharmacy would you like this sent to? [facility] in [place].   Patient notified that their request is being sent to the clinical staff for review and that they should receive a call once it is complete. If they do not receive a call within 24 hours they can check with their pharmacy or our office.

## 2017-06-27 ENCOUNTER — Other Ambulatory Visit: Payer: Self-pay | Admitting: *Deleted

## 2017-06-27 DIAGNOSIS — J449 Chronic obstructive pulmonary disease, unspecified: Secondary | ICD-10-CM

## 2017-06-27 MED ORDER — GLUCOSE BLOOD VI STRP: ORAL_STRIP | 5 refills | Status: DC

## 2017-06-27 MED ORDER — UMECLIDINIUM-VILANTEROL 62.5-25 MCG/INH IN AEPB: 1.0000 | INHALATION_SPRAY | Freq: Every day | RESPIRATORY_TRACT | 3 refills | Status: DC

## 2017-06-27 MED ORDER — LISINOPRIL 20 MG PO TABS: 20.0000 mg | ORAL_TABLET | Freq: Every day | ORAL | 1 refills | Status: DC

## 2017-06-27 MED ORDER — PRAVASTATIN SODIUM 40 MG PO TABS: 40.0000 mg | ORAL_TABLET | Freq: Every day | ORAL | 1 refills | Status: DC

## 2017-06-27 NOTE — Telephone Encounter (Signed)
Fine to send in those refills for a 6 mo supply,thanks

## 2017-07-29 ENCOUNTER — Encounter: Payer: Self-pay | Admitting: Pediatrics

## 2017-07-29 ENCOUNTER — Ambulatory Visit (INDEPENDENT_AMBULATORY_CARE_PROVIDER_SITE_OTHER): Payer: Medicare Other | Admitting: Pediatrics

## 2017-07-29 VITALS — BP 130/84 | HR 60 | Temp 97.0°F | Ht 66.0 in | Wt 224.0 lb

## 2017-07-29 DIAGNOSIS — J449 Chronic obstructive pulmonary disease, unspecified: Secondary | ICD-10-CM | POA: Diagnosis not present

## 2017-07-29 DIAGNOSIS — J9611 Chronic respiratory failure with hypoxia: Secondary | ICD-10-CM | POA: Diagnosis not present

## 2017-07-29 DIAGNOSIS — E669 Obesity, unspecified: Secondary | ICD-10-CM

## 2017-07-29 DIAGNOSIS — Z1211 Encounter for screening for malignant neoplasm of colon: Secondary | ICD-10-CM | POA: Diagnosis not present

## 2017-07-29 DIAGNOSIS — E119 Type 2 diabetes mellitus without complications: Secondary | ICD-10-CM | POA: Diagnosis not present

## 2017-07-29 LAB — BAYER DCA HB A1C WAIVED: HB A1C (BAYER DCA - WAIVED): 7.7 % — ABNORMAL HIGH (ref <7.0)

## 2017-07-29 MED ORDER — UMECLIDINIUM-VILANTEROL 62.5-25 MCG/INH IN AEPB: 1.0000 | INHALATION_SPRAY | Freq: Every day | RESPIRATORY_TRACT | 3 refills | Status: DC

## 2017-07-29 MED ORDER — DULAGLUTIDE 1.5 MG/0.5ML ~~LOC~~ SOAJ: 1.5000 mg | SUBCUTANEOUS | 1 refills | Status: DC

## 2017-07-29 MED ORDER — SITAGLIPTIN PHOSPHATE 100 MG PO TABS: 100.0000 mg | ORAL_TABLET | Freq: Every day | ORAL | 1 refills | Status: DC

## 2017-07-29 NOTE — Progress Notes (Signed)
  Subjective:   Patient ID: Brent Snow, male    DOB: 27-Sep-1941, 76 y.o.   MRN: [id number] CC: 3 mo followup  HPI: Brent Snow is a 76 y.o. male presenting for 3 mo followup  DM2: 120-160 in the morning Taking meds regularly No s/e  HTN: no CP  COPD: continues to have some SOB Rarely has to use oxygen recently O2 sat at home has been around 90% If he walks slow he does fine  Elevated BMI: Has been eating more sweets recently  Relevant past medical, surgical, family and social history reviewed. Allergies and medications reviewed and updated. History  Smoking Status  . Former Smoker  . Packs/day: 2.00  . Years: 40.00  . Types: Cigarettes  . Quit date: 12/06/2006  Smokeless Tobacco  . Never Used   ROS: Per HPI   Objective:    BP 130/84   Pulse 60   Temp (!) 97 F (36.1 C) (Oral)   Ht _0  (1.676 m)   Wt 224 lb (101.6 kg)   BMI 36.15 kg/m   Wt Readings from Last 3 Encounters:  07/29/17 224 lb (101.6 kg)  04/28/17 211 lb (95.7 kg)  04/12/17 230 lb (104.3 kg)    Gen: NAD, alert, cooperative with exam, NCAT EYES: EOMI, no conjunctival injection, or no icterus ENT:  OP without erythema CV: NRRR, normal S1/S2, no murmur, distal pulses 2+ b/l Resp: CTABL, no wheezes, normal WOB Abd: +BS, soft, NTND. no guarding Ext: No edema, warm Neuro: Alert and oriented, strength equal b/l UE and LE, coordination grossly normal MSK: normal muscle bulk  Assessment & Plan:  Brent Snow was seen today for 3 mo followup.  Diagnoses and all orders for this visit:  Chronic hypoxemic respiratory failure ([place]) Follows with pulm On oxygen as needed, mostly using with exertion now  Type 2 diabetes mellitus without complication, without long-term current use of insulin (HCC) A1c 7.7, improved from 9.6 Stop januvia Take trulicity 2.0BT qweek Take metformin 1010m BID Cont to avoid sugary foods -     BMP8+EGFR -     Bayer DCA Hb A1c Waived  Chronic obstructive pulmonary  disease, unspecified COPD type (HCC) Stable symptoms Cont below, following with pulm -     umeclidinium-vilanterol (ANORO ELLIPTA) 62.5-25 MCG/INH AEPB; Inhale 1 puff into the lungs daily.  Obesity, Class II, BMI 35-39.9, isolated Cont avoiding sugary foods Weight up since last visit, no swelling  Colon cancer screening -     Fecal occult blood, imunochemical; Future   Follow up plan: Return in about 3 months (around 10/29/2017). CAssunta Found MD [place]

## 2017-07-30 LAB — BMP8+EGFR
BUN/Creatinine Ratio: 19 (ref 10–24)
BUN: 18 mg/dL (ref 8–27)
CALCIUM: 8.9 mg/dL (ref 8.6–10.2)
CHLORIDE: 102 mmol/L (ref 96–106)
CO2: 23 mmol/L (ref 20–29)
Creatinine, Ser: 0.93 mg/dL (ref 0.76–1.27)
GFR calc non Af Amer: 81 mL/min/{1.73_m2} (ref >59)
GFR, EST AFRICAN AMERICAN: 93 mL/min/{1.73_m2} (ref >59)
Glucose: 143 mg/dL — ABNORMAL HIGH (ref 65–99)
Potassium: 4.6 mmol/L (ref 3.5–5.2)
Sodium: 142 mmol/L (ref 134–144)

## 2017-09-26 ENCOUNTER — Other Ambulatory Visit: Payer: Self-pay | Admitting: Pediatrics

## 2017-09-26 DIAGNOSIS — I10 Essential (primary) hypertension: Secondary | ICD-10-CM

## 2017-10-04 ENCOUNTER — Ambulatory Visit: Payer: Medicare Other

## 2017-10-04 ENCOUNTER — Encounter: Payer: Self-pay | Admitting: Pediatrics

## 2017-10-20 ENCOUNTER — Encounter: Payer: Self-pay | Admitting: Emergency Medicine

## 2017-10-20 ENCOUNTER — Ambulatory Visit (INDEPENDENT_AMBULATORY_CARE_PROVIDER_SITE_OTHER): Payer: Medicare Other | Admitting: Emergency Medicine

## 2017-10-20 DIAGNOSIS — J449 Chronic obstructive pulmonary disease, unspecified: Secondary | ICD-10-CM | POA: Diagnosis not present

## 2017-10-20 DIAGNOSIS — G4733 Obstructive sleep apnea (adult) (pediatric): Secondary | ICD-10-CM

## 2017-10-20 NOTE — Progress Notes (Signed)
Subjective:    Patient ID: Brent Snow, male    DOB: 01/08/1941, 76 y.o.   MRN: [id number]  HPI 76 year old man with a history of severe COPD, exertional hypoxemia, obstructive sleep apnea, diabetes mellitus. He has chronic exertional hypoxemia, uses oxygen as needed. Chronic bronchodilator is Anoro. Tells me that he wears his CPAP approximately every night - feels a benefit, better rested, less sleepy the next day. He recently went to [facility] and was able to walk a lot, used his O2 when he got back to the room. He feels that his breathing may be a bit better. He has lost a few lbs. No flares, no hospitalizations. He did have some dyspnea after gardening mid-week last week. Uses albuterol, not every day.   ROV 10/20/17 --follow-up visit for severe COPD, obstructive sleep apnea and exertional hypoxemia. He is on Anoro, feels that he benefits. He is using albuterol as needed - sometimes none, sometimes 2-3 times a day.  His last flare was over a year ago. He rarely uses his O2 despite documented exertional desats.  Offered him the flu shot today.  He does not want to get it.                                                                                                                              Review of Systems  Constitutional: Negative for fever and unexpected weight change.  HENT: Negative for congestion, dental problem, ear pain, nosebleeds, postnasal drip, rhinorrhea, sinus pressure, sneezing, sore throat and trouble swallowing.   Eyes: Negative for redness and itching.  Respiratory: Positive for shortness of breath. Negative for cough, chest tightness and wheezing.   Cardiovascular: Negative for palpitations and leg swelling.  Gastrointestinal: Negative for nausea and vomiting.  Genitourinary: Negative for dysuria.  Musculoskeletal: Negative for joint swelling.       Pain and stiffness  Skin: Negative for rash.  Neurological: Negative for headaches.  Hematological: Does not  bruise/bleed easily.  Psychiatric/Behavioral: Negative for dysphoric mood and suicidal ideas. The patient is not nervous/anxious.       Objective:   Physical Exam Vitals:   10/20/17 1137  BP: 130/70  Pulse: 88  SpO2: 94%  Weight: 226 lb 2 oz (102.6 kg)  Height: 5\' 6"  (1.676 m)   Gen: Pleasant, overweight, in no distress,  normal affect  ENT: No lesions,  mouth clear,  oropharynx clear, no postnasal drip  Neck: No JVD, no TMG, no carotid bruits  Lungs: No use of accessory muscles, clear without rales or rhonchi  Cardiovascular: RRR, heart sounds normal, no murmur or gallops, no peripheral edema  Musculoskeletal: No deformities, no cyanosis or clubbing  Neuro: alert, non focal  Skin: Warm, no lesions or rashes     Assessment & Plan:  COPD (chronic obstructive pulmonary disease) And you Anoro as you have been taking it Keep albuterol available to use 2 puffs if needed for shortness of breath You  would benefit from wearing your oxygen with exertion.  Our goal is to keep your saturation greater than 90% when you are exerting yourself. You would probably benefit from taking the flu shot Your pneumonia shots are up-to-date Follow with Dr Lamonte Sakai in 6 months or sooner if you have any problems  Obstructive sleep apnea Continue your CPAP every night as you have been doing it  Baltazar Apo, MD, PhD 10/20/2017, 12:19 PM [facility] [phone] or if no answer [phone]

## 2017-10-20 NOTE — Assessment & Plan Note (Signed)
And you Anoro as you have been taking it Keep albuterol available to use 2 puffs if needed for shortness of breath You would benefit from wearing your oxygen with exertion.  Our goal is to keep your saturation greater than 90% when you are exerting yourself. You would probably benefit from taking the flu shot Your pneumonia shots are up-to-date Follow with Dr Lamonte Sakai in 6 months or sooner if you have any problems

## 2017-10-20 NOTE — Assessment & Plan Note (Signed)
Continue your CPAP every night as you have been doing it

## 2017-10-20 NOTE — Patient Instructions (Signed)
And you Anoro as you have been taking it Keep albuterol available to use 2 puffs if needed for shortness of breath You would benefit from wearing your oxygen with exertion.  Our goal is to keep your saturation greater than 90% when you are exerting yourself. Continue your CPAP every night as you have been doing it You would probably benefit from taking the flu shot Your pneumonia shots are up-to-date Follow with Dr Lamonte Sakai in 6 months or sooner if you have any problems

## 2017-10-31 ENCOUNTER — Encounter: Payer: Self-pay | Admitting: Pediatrics

## 2017-10-31 ENCOUNTER — Ambulatory Visit (INDEPENDENT_AMBULATORY_CARE_PROVIDER_SITE_OTHER): Payer: Medicare Other | Admitting: Pediatrics

## 2017-10-31 VITALS — BP 139/83 | HR 66 | Temp 97.5°F | Ht 66.0 in | Wt 228.8 lb

## 2017-10-31 DIAGNOSIS — E785 Hyperlipidemia, unspecified: Secondary | ICD-10-CM | POA: Diagnosis not present

## 2017-10-31 DIAGNOSIS — I10 Essential (primary) hypertension: Secondary | ICD-10-CM | POA: Diagnosis not present

## 2017-10-31 DIAGNOSIS — J449 Chronic obstructive pulmonary disease, unspecified: Secondary | ICD-10-CM

## 2017-10-31 DIAGNOSIS — E119 Type 2 diabetes mellitus without complications: Secondary | ICD-10-CM

## 2017-10-31 LAB — BAYER DCA HB A1C WAIVED: HB A1C (BAYER DCA - WAIVED): 7.2 % — ABNORMAL HIGH (ref <7.0)

## 2017-10-31 MED ORDER — METFORMIN HCL 500 MG PO TABS: 1000.0000 mg | ORAL_TABLET | Freq: Two times a day (BID) | ORAL | 1 refills | Status: DC

## 2017-10-31 MED ORDER — DULAGLUTIDE 1.5 MG/0.5ML ~~LOC~~ SOAJ: 1.5000 mg | SUBCUTANEOUS | 1 refills | Status: DC

## 2017-10-31 MED ORDER — METFORMIN HCL 500 MG PO TABS: 1000.0000 mg | ORAL_TABLET | Freq: Two times a day (BID) | ORAL | 0 refills | Status: DC

## 2017-10-31 NOTE — Progress Notes (Signed)
  Subjective:   Patient ID: Brent Snow, male    DOB: [birth year], 76 y.o.   MRN: [id number] CC: Follow-up (3 month)  HPI: Brent Snow is a 76 y.o. male presenting for Follow-up (3 month)  DM2: blood sugars 110-115 when he wakes up Avoiding sugary foods Does think he has been eating more of what he should not last week with Thanksgiving  COPD: breathing has been ok Using oxygen very little per pt Rarely needing albuterol, hasnt used for past two days  HTN: no CP 129/69 at home yesterday morning, checking regularly   No numbness or tingling in feet, can see the bottoms of his feet  Going to [place] in [place] tomorrow, excited about the trip  Relevant past medical, surgical, family and social history reviewed. Allergies and medications reviewed and updated. Social History   Tobacco Use  Smoking Status Former Smoker  . Packs/day: 2.00  . Years: 40.00  . Pack years: 80.00  . Types: Cigarettes  . Last attempt to quit: 12/06/2006  . Years since quitting: 10.9  Smokeless Tobacco Never Used   ROS: Per HPI   Objective:    BP 139/83   Pulse 66   Temp (!) 97.5 F (36.4 C) (Oral)   Ht 5\' 6"  (1.676 m)   Wt 228 lb 12.8 oz (103.8 kg)   BMI 36.93 kg/m   Wt Readings from Last 3 Encounters:  10/31/17 228 lb 12.8 oz (103.8 kg)  10/20/17 226 lb 2 oz (102.6 kg)  07/29/17 224 lb (101.6 kg)    Gen: NAD, alert, cooperative with exam, NCAT EYES: EOMI, no conjunctival injection, or no icterus ENT: OP without erythema CV: NRRR, normal S1/S2, no murmur, distal pulses 2+ b/l Resp: CTABL, no wheezes, normal WOB Abd: +BS, soft, NTND.  Ext: No edema, warm Neuro: Alert and oriented, strength equal b/l UE and LE, coordination grossly normal MSK: normal muscle bulk  Assessment & Plan:  Brent Snow was seen today for follow-up med problems  Diagnoses and all orders for this visit:  Type 2 diabetes mellitus without complication, without long-term current use of insulin  (HCC) A1c 7.2 Continue current medicines -     Bayer DCA Hb A1c Waived -     Dulaglutide 1.5 MG/0.5ML SOPN; Inject 1.5 mg into the skin once a week. -     metFORMIN (GLUCOPHAGE) 500 MG tablet; Take 2 tablets (1,000 mg total) by mouth 2 (two) times daily with a meal.  Hyperlipidemia, unspecified hyperlipidemia type Stable, continue statin  Essential hypertension Slightly elevated today, better numbers at home Continue to check at home regularly  Chronic obstructive pulmonary disease, unspecified COPD type ([place]) Stable, following with pulmonology Infrequent use of albuterol  Follow up plan: Return in about 6 months (around 04/30/2018). [name], MD [place]

## 2017-10-31 NOTE — Addendum Note (Signed)
Addended by: Wardell Heath on: 10/31/2017 08:53 AM   Modules accepted: Orders

## 2017-11-02 ENCOUNTER — Ambulatory Visit: Payer: Medicare Other | Admitting: Pediatrics

## 2017-12-20 ENCOUNTER — Other Ambulatory Visit: Payer: Self-pay | Admitting: Pediatrics

## 2017-12-20 DIAGNOSIS — J449 Chronic obstructive pulmonary disease, unspecified: Secondary | ICD-10-CM

## 2018-03-21 ENCOUNTER — Ambulatory Visit (INDEPENDENT_AMBULATORY_CARE_PROVIDER_SITE_OTHER): Payer: Medicare Other | Admitting: Urology

## 2018-03-21 DIAGNOSIS — N5201 Erectile dysfunction due to arterial insufficiency: Secondary | ICD-10-CM

## 2018-03-29 ENCOUNTER — Other Ambulatory Visit: Payer: Self-pay | Admitting: Pediatrics

## 2018-03-29 DIAGNOSIS — I10 Essential (primary) hypertension: Secondary | ICD-10-CM

## 2018-03-29 NOTE — Telephone Encounter (Signed)
OV 05/03/17

## 2018-04-14 ENCOUNTER — Other Ambulatory Visit: Payer: Self-pay | Admitting: Pediatrics

## 2018-04-14 DIAGNOSIS — E119 Type 2 diabetes mellitus without complications: Secondary | ICD-10-CM

## 2018-04-14 NOTE — Telephone Encounter (Signed)
OV 05/03/18

## 2018-04-18 ENCOUNTER — Ambulatory Visit (INDEPENDENT_AMBULATORY_CARE_PROVIDER_SITE_OTHER): Payer: Medicare Other | Admitting: Urology

## 2018-04-18 DIAGNOSIS — N5201 Erectile dysfunction due to arterial insufficiency: Secondary | ICD-10-CM

## 2018-04-19 ENCOUNTER — Encounter: Payer: Self-pay | Admitting: Emergency Medicine

## 2018-04-19 ENCOUNTER — Ambulatory Visit (INDEPENDENT_AMBULATORY_CARE_PROVIDER_SITE_OTHER): Payer: Medicare Other | Admitting: Emergency Medicine

## 2018-04-19 DIAGNOSIS — J449 Chronic obstructive pulmonary disease, unspecified: Secondary | ICD-10-CM | POA: Diagnosis not present

## 2018-04-19 DIAGNOSIS — G4733 Obstructive sleep apnea (adult) (pediatric): Secondary | ICD-10-CM | POA: Diagnosis not present

## 2018-04-19 DIAGNOSIS — J9611 Chronic respiratory failure with hypoxia: Secondary | ICD-10-CM | POA: Diagnosis not present

## 2018-04-19 MED ORDER — FLUTICASONE-UMECLIDIN-VILANT 100-62.5-25 MCG/INH IN AEPB: 1.0000 | INHALATION_SPRAY | Freq: Every day | RESPIRATORY_TRACT | 0 refills | Status: DC

## 2018-04-19 NOTE — Patient Instructions (Addendum)
Please temporarily stop Anoro We will start Trelegy one inhalation once daily for 3-4 weeks to see if you get more benefit. Rinse and gargle after using.  Please call our office to let us know if you feel that you get more benefit from the Trelegy.  If so then we will send a prescription to your pharmacy. Please continue your albuterol up to every 4 hours as needed for shortness of breath, wheezing, chest tightness. Continue to use your CPAP every night. You would benefit from wearing your oxygen with heavy exertion Follow with Dr Lamonte Sakai in 6 months or sooner if you have any problems

## 2018-04-19 NOTE — Assessment & Plan Note (Signed)
Continue to use your CPAP every night.

## 2018-04-19 NOTE — Progress Notes (Signed)
Subjective:    Patient ID: Brent Snow, male    DOB: Nov 14, 1941, 77 y.o.   MRN: [id number]  COPD  He complains of shortness of breath. There is no cough or wheezing. Pertinent negatives include no ear pain, fever, headaches, postnasal drip, rhinorrhea, sneezing, sore throat or trouble swallowing. His past medical history is significant for COPD.   77 year old man with a history of severe COPD, exertional hypoxemia, obstructive sleep apnea, diabetes mellitus. He has chronic exertional hypoxemia, uses oxygen as needed. Chronic bronchodilator is Anoro. Tells me that he wears his CPAP approximately every night - feels a benefit, better rested, less sleepy the next day. He recently went to [facility] and was able to walk a lot, used his O2 when he got back to the room. He feels that his breathing may be a bit better. He has lost a few lbs. No flares, no hospitalizations. He did have some dyspnea after gardening mid-week last week. Uses albuterol, not every day.   ROV 10/20/17 --follow-up visit for severe COPD, obstructive sleep apnea and exertional hypoxemia. He is on Anoro, feels that he benefits. He is using albuterol as needed - sometimes none, sometimes 2-3 times a day.  His last flare was over a year ago. He rarely uses his O2 despite documented exertional desats.  Offered him the flu shot today.  He does not want to get it.       ROV 04/19/18 --77 year old gentleman with severe COPD, associated chronic hypoxemic respiratory failure with exertional desaturations. He uses his O2 after he exerts and has made him tired, desaturated.  Also with obstructive sleep apnea, wears CPAP reliably.  He has been managed on Anoro.  He presents today reporting that he has been doing fairly well. No flares since last time. He uses albuterol about every day.  He is interested in possibly trying trelegy. Occasional wheeze, no cough.                                                                             Review of  Systems  Constitutional: Negative for fever and unexpected weight change.  HENT: Negative for congestion, dental problem, ear pain, nosebleeds, postnasal drip, rhinorrhea, sinus pressure, sneezing, sore throat and trouble swallowing.   Eyes: Negative for redness and itching.  Respiratory: Positive for shortness of breath. Negative for cough, chest tightness and wheezing.   Cardiovascular: Negative for palpitations and leg swelling.  Gastrointestinal: Negative for nausea and vomiting.  Genitourinary: Negative for dysuria.  Musculoskeletal: Negative for joint swelling.       Pain and stiffness  Skin: Negative for rash.  Neurological: Negative for headaches.  Hematological: Does not bruise/bleed easily.  Psychiatric/Behavioral: Negative for dysphoric mood and suicidal ideas. The patient is not nervous/anxious.       Objective:   Physical Exam Vitals:   04/19/18 1338  BP: 122/80  Pulse: 71  SpO2: 91%  Weight: 224 lb (101.6 kg)  Height: 5\' 5"  (1.651 m)   Gen: Pleasant, overweight, in no distress,  normal affect  ENT: No lesions,  mouth clear,  oropharynx clear, no postnasal drip  Neck: No JVD, no stridor  Lungs: No use of accessory muscles, distant, few scattered  end exp wheezes  Cardiovascular: RRR, heart sounds normal, no murmur or gallops, no peripheral edema  Musculoskeletal: No deformities, no cyanosis or clubbing  Neuro: alert, non focal  Skin: Warm, no lesions or rashes     Assessment & Plan:  COPD (chronic obstructive pulmonary disease) Please temporarily stop Anoro We will start Trelegy one inhalation once daily for 3-4 weeks to see if you get more benefit. Rinse and gargle after using.  Please call our office to let us know if you feel that you get more benefit from the Trelegy.  If so then we will send a prescription to your pharmacy. Please continue your albuterol up to every 4 hours as needed for shortness of breath, wheezing, chest tightness.  Obstructive  sleep apnea Continue to use your CPAP every night.  Chronic hypoxemic respiratory failure You would benefit from wearing your oxygen with heavy exertion  Baltazar Apo, MD, PhD 04/19/2018, 2:11 PM [facility] [phone] or if no answer [phone]

## 2018-04-19 NOTE — Assessment & Plan Note (Signed)
You would benefit from wearing your oxygen with heavy exertion

## 2018-04-19 NOTE — Assessment & Plan Note (Signed)
Please temporarily stop Anoro We will start Trelegy one inhalation once daily for 3-4 weeks to see if you get more benefit. Rinse and gargle after using.  Please call our office to let us know if you feel that you get more benefit from the Trelegy.  If so then we will send a prescription to your pharmacy. Please continue your albuterol up to every 4 hours as needed for shortness of breath, wheezing, chest tightness.

## 2018-05-03 ENCOUNTER — Ambulatory Visit (INDEPENDENT_AMBULATORY_CARE_PROVIDER_SITE_OTHER): Payer: Medicare Other | Admitting: Pediatrics

## 2018-05-03 ENCOUNTER — Encounter: Payer: Self-pay | Admitting: Pediatrics

## 2018-05-03 VITALS — BP 127/72 | HR 64 | Temp 98.0°F | Ht 65.0 in | Wt 220.2 lb

## 2018-05-03 DIAGNOSIS — I10 Essential (primary) hypertension: Secondary | ICD-10-CM | POA: Diagnosis not present

## 2018-05-03 DIAGNOSIS — E119 Type 2 diabetes mellitus without complications: Secondary | ICD-10-CM | POA: Diagnosis not present

## 2018-05-03 DIAGNOSIS — Z6836 Body mass index (BMI) 36.0-36.9, adult: Secondary | ICD-10-CM | POA: Diagnosis not present

## 2018-05-03 DIAGNOSIS — Z1211 Encounter for screening for malignant neoplasm of colon: Secondary | ICD-10-CM

## 2018-05-03 DIAGNOSIS — G4733 Obstructive sleep apnea (adult) (pediatric): Secondary | ICD-10-CM

## 2018-05-03 DIAGNOSIS — L570 Actinic keratosis: Secondary | ICD-10-CM

## 2018-05-03 LAB — BAYER DCA HB A1C WAIVED: HB A1C (BAYER DCA - WAIVED): 7.3 % — ABNORMAL HIGH (ref <7.0)

## 2018-05-03 NOTE — Patient Instructions (Signed)
Back Exercises If you have pain in your back, do these exercises 2-3 times each day or as told by your doctor. When the pain goes away, do the exercises once each day, but repeat the steps more times for each exercise (do more repetitions). If you do not have pain in your back, do these exercises once each day or as told by your doctor. Exercises Single Knee to Chest  Do these steps 3-5 times in a row for each leg: 1. Lie on your back on a firm bed or the floor with your legs stretched out. 2. Bring one knee to your chest. 3. Hold your knee to your chest by grabbing your knee or thigh. 4. Pull on your knee until you feel a gentle stretch in your lower back. 5. Keep doing the stretch for 10-30 seconds. 6. Slowly let go of your leg and straighten it.  Pelvic Tilt  Do these steps 5-10 times in a row: 1. Lie on your back on a firm bed or the floor with your legs stretched out. 2. Bend your knees so they point up to the ceiling. Your feet should be flat on the floor. 3. Tighten your lower belly (abdomen) muscles to press your lower back against the floor. This will make your tailbone point up to the ceiling instead of pointing down to your feet or the floor. 4. Stay in this position for 5-10 seconds while you gently tighten your muscles and breathe evenly.  Cat-Cow  Do these steps until your lower back bends more easily: 1. Get on your hands and knees on a firm surface. Keep your hands under your shoulders, and keep your knees under your hips. You may put padding under your knees. 2. Let your head hang down, and make your tailbone point down to the floor so your lower back is round like the back of a cat. 3. Stay in this position for 5 seconds. 4. Slowly lift your head and make your tailbone point up to the ceiling so your back hangs low (sags) like the back of a cow. 5. Stay in this position for 5 seconds.  Press-Ups  Do these steps 5-10 times in a row: 1. Lie on your belly (face-down)  on the floor. 2. Place your hands near your head, about shoulder-width apart. 3. While you keep your back relaxed and keep your hips on the floor, slowly straighten your arms to raise the top half of your body and lift your shoulders. Do not use your back muscles. To make yourself more comfortable, you may change where you place your hands. 4. Stay in this position for 5 seconds. 5. Slowly return to lying flat on the floor.  Bridges  Do these steps 10 times in a row: 1. Lie on your back on a firm surface. 2. Bend your knees so they point up to the ceiling. Your feet should be flat on the floor. 3. Tighten your butt muscles and lift your butt off of the floor until your waist is almost as high as your knees. If you do not feel the muscles working in your butt and the back of your thighs, slide your feet 1-2 inches farther away from your butt. 4. Stay in this position for 3-5 seconds. 5. Slowly lower your butt to the floor, and let your butt muscles relax.  If this exercise is too easy, try doing it with your arms crossed over your chest. Back Lifts Do these steps 5-10 times in a   row: 1. Lie on your belly (face-down) with your arms at your sides, and rest your forehead on the floor. 2. Tighten the muscles in your legs and your butt. 3. Slowly lift your chest off of the floor while you keep your hips on the floor. Keep the back of your head in line with the curve in your back. Look at the floor while you do this. 4. Stay in this position for 3-5 seconds. 5. Slowly lower your chest and your face to the floor.  Contact a doctor if:  Your back pain gets a lot worse when you do an exercise.  Your back pain does not lessen 2 hours after you exercise. If you have any of these problems, stop doing the exercises. Do not do them again unless your doctor says it is okay. Get help right away if:  You have sudden, very bad back pain. If this happens, stop doing the exercises. Do not do them again  unless your doctor says it is okay. This information is not intended to replace advice given to you by your health care provider. Make sure you discuss any questions you have with your health care provider. Document Released: 12/25/2010 Document Revised: 04/29/2016 Document Reviewed: 01/16/2015 Elsevier Interactive Patient Education  2018 Elsevier Inc.   

## 2018-05-03 NOTE — Progress Notes (Signed)
  Subjective:   Patient ID: Brent Snow, male    DOB: [birth year], 77 y.o.   MRN: [id number] CC: Hypertension (6 month follow up) and Diabetes  HPI: [name] is a 77 y.o. male   Dm2: BGLs 130-140.  Snacking has been hard to avoid.  Has been looking carbohydrate intake better.  Elevated BMI: trying to lose weight.  Has been pleased with 8 pounds so far.  HTN: not regularly checking at home. No HA or CP, no dizziness  COPD: starting trelegy soon.  Following with pulmonology.  He is hopeful that will give him better breath.  Has a sore on L side of his nose.  It is where his glasses have been irritating.  Has several other rough places on his arms.  Relevant past medical, surgical, family and social history reviewed. Allergies and medications reviewed and updated. Social History   Tobacco Use  Smoking Status Former Smoker  . Packs/day: 2.00  . Years: 40.00  . Pack years: 80.00  . Types: Cigarettes  . Last attempt to quit: 12/06/2006  . Years since quitting: 11.4  Smokeless Tobacco Never Used   ROS: Per HPI   Objective:    BP 127/72   Pulse 64   Temp 98 F (36.7 C) (Oral)   Ht '5\' 5"'$  (1.651 m)   Wt 220 lb 3.2 oz (99.9 kg)   BMI 36.64 kg/m   Wt Readings from Last 3 Encounters:  05/03/18 220 lb 3.2 oz (99.9 kg)  04/19/18 224 lb (101.6 kg)  10/31/17 228 lb 12.8 oz (103.8 kg)    Gen: NAD, alert, cooperative with exam, NCAT EYES: EOMI, no conjunctival injection, or no icterus ENT:   OP without erythema LYMPH: no cervical LAD CV: NRRR, normal S1/S2, no murmur, distal pulses 2+ b/l Resp: CTABL, no wheezes, normal WOB Ext: No edema, warm Neuro: Alert and oriented, strength equal b/l UE and LE, coordination grossly normal Skin: Several rough white 1 to 3 mm patches over bilateral sun exposed areas of forearms and back of hands.  Left side of nose below I with approximately 2 mm red-based erosion  Assessment & Plan:  [name] was seen today for hypertension and  diabetes.  Diagnoses and all orders for this visit:  Essential hypertension Stable, due for labs. -     BMP8+EGFR  Screen for colon cancer Has been given FIT tests in the past.  Open to Cologuard. -     Cologuard  Type 2 diabetes mellitus without complication, without long-term current use of insulin (HCC) Check A1c today.  Continue current medicines.  Discussed avoiding sugary foods and snacks. -     Bayer DCA Hb A1c Waived  Obstructive sleep apnea Uses CPAP machine now.  BMI 36.0-36.9,adult Continue lifestyle changes and efforts towards weight loss.  Strategies discussed  Actinic keratoses Recommend following up with dermatology.  Patient wants to call to set up the appointment.  Follow up plan: Return in about 6 months (around 10/18/2018). [name], MD [place]

## 2018-05-04 LAB — BMP8+EGFR
BUN / CREAT RATIO: 25 — AB (ref 10–24)
BUN: 24 mg/dL (ref 8–27)
CALCIUM: 9.3 mg/dL (ref 8.6–10.2)
CO2: 27 mmol/L (ref 20–29)
Chloride: 100 mmol/L (ref 96–106)
Creatinine, Ser: 0.96 mg/dL (ref 0.76–1.27)
GFR calc Af Amer: 89 mL/min/{1.73_m2} (ref >59)
GFR calc non Af Amer: 77 mL/min/{1.73_m2} (ref >59)
GLUCOSE: 124 mg/dL — AB (ref 65–99)
Potassium: 5.5 mmol/L — ABNORMAL HIGH (ref 3.5–5.2)
Sodium: 145 mmol/L — ABNORMAL HIGH (ref 134–144)

## 2018-05-08 ENCOUNTER — Other Ambulatory Visit: Payer: Self-pay | Admitting: *Deleted

## 2018-05-08 DIAGNOSIS — E875 Hyperkalemia: Secondary | ICD-10-CM

## 2018-05-11 ENCOUNTER — Other Ambulatory Visit: Payer: Medicare Other

## 2018-05-11 DIAGNOSIS — E875 Hyperkalemia: Secondary | ICD-10-CM

## 2018-05-11 LAB — BMP8+EGFR
BUN/Creatinine Ratio: 23 (ref 10–24)
BUN: 23 mg/dL (ref 8–27)
CALCIUM: 9.4 mg/dL (ref 8.6–10.2)
CHLORIDE: 104 mmol/L (ref 96–106)
CO2: 24 mmol/L (ref 20–29)
Creatinine, Ser: 1.01 mg/dL (ref 0.76–1.27)
GFR calc Af Amer: 84 mL/min/{1.73_m2} (ref >59)
GFR, EST NON AFRICAN AMERICAN: 72 mL/min/{1.73_m2} (ref >59)
GLUCOSE: 143 mg/dL — AB (ref 65–99)
POTASSIUM: 5.3 mmol/L — AB (ref 3.5–5.2)
SODIUM: 140 mmol/L (ref 134–144)

## 2018-05-16 DIAGNOSIS — L570 Actinic keratosis: Secondary | ICD-10-CM | POA: Diagnosis not present

## 2018-05-16 DIAGNOSIS — L720 Epidermal cyst: Secondary | ICD-10-CM | POA: Diagnosis not present

## 2018-05-16 DIAGNOSIS — D485 Neoplasm of uncertain behavior of skin: Secondary | ICD-10-CM | POA: Diagnosis not present

## 2018-05-16 DIAGNOSIS — D0359 Melanoma in situ of other part of trunk: Secondary | ICD-10-CM | POA: Diagnosis not present

## 2018-05-16 DIAGNOSIS — L821 Other seborrheic keratosis: Secondary | ICD-10-CM | POA: Diagnosis not present

## 2018-05-16 DIAGNOSIS — C44311 Basal cell carcinoma of skin of nose: Secondary | ICD-10-CM | POA: Diagnosis not present

## 2018-05-24 DIAGNOSIS — Z1211 Encounter for screening for malignant neoplasm of colon: Secondary | ICD-10-CM | POA: Diagnosis not present

## 2018-05-31 ENCOUNTER — Telehealth: Payer: Self-pay | Admitting: Emergency Medicine

## 2018-05-31 MED ORDER — FLUTICASONE-UMECLIDIN-VILANT 100-62.5-25 MCG/INH IN AEPB: 1.0000 | INHALATION_SPRAY | Freq: Every day | RESPIRATORY_TRACT | 1 refills | Status: DC

## 2018-05-31 NOTE — Telephone Encounter (Signed)
Called spoke with patient who reported that he is "so much better" on the Trelegy and would like to switch from Anoro to Trelegy Patient verified pharmacy as [facility] and spoke with Di Kindle - cancelled the [facility] Med list updated  Will sign and forward to RB to make him aware

## 2018-06-01 DIAGNOSIS — D0359 Melanoma in situ of other part of trunk: Secondary | ICD-10-CM | POA: Diagnosis not present

## 2018-06-02 LAB — COLOGUARD
Cologuard: NEGATIVE
Cologuard: NEGATIVE

## 2018-06-21 ENCOUNTER — Other Ambulatory Visit: Payer: Self-pay | Admitting: Pediatrics

## 2018-06-21 DIAGNOSIS — I10 Essential (primary) hypertension: Secondary | ICD-10-CM

## 2018-07-11 DIAGNOSIS — L821 Other seborrheic keratosis: Secondary | ICD-10-CM | POA: Diagnosis not present

## 2018-07-11 DIAGNOSIS — L570 Actinic keratosis: Secondary | ICD-10-CM | POA: Diagnosis not present

## 2018-07-11 DIAGNOSIS — Z85820 Personal history of malignant melanoma of skin: Secondary | ICD-10-CM | POA: Diagnosis not present

## 2018-07-11 DIAGNOSIS — Z85828 Personal history of other malignant neoplasm of skin: Secondary | ICD-10-CM | POA: Diagnosis not present

## 2018-07-13 DIAGNOSIS — C44311 Basal cell carcinoma of skin of nose: Secondary | ICD-10-CM | POA: Diagnosis not present

## 2018-07-18 ENCOUNTER — Ambulatory Visit: Payer: Medicare Other

## 2018-09-02 ENCOUNTER — Other Ambulatory Visit: Payer: Self-pay | Admitting: Pediatrics

## 2018-09-02 DIAGNOSIS — E119 Type 2 diabetes mellitus without complications: Secondary | ICD-10-CM

## 2018-09-07 ENCOUNTER — Other Ambulatory Visit: Payer: Self-pay | Admitting: *Deleted

## 2018-09-07 MED ORDER — FLUTICASONE-UMECLIDIN-VILANT 100-62.5-25 MCG/INH IN AEPB: 1.0000 | INHALATION_SPRAY | Freq: Every day | RESPIRATORY_TRACT | 1 refills | Status: DC

## 2018-09-19 ENCOUNTER — Encounter: Payer: Self-pay | Admitting: *Deleted

## 2018-10-11 ENCOUNTER — Other Ambulatory Visit: Payer: Self-pay | Admitting: Pediatrics

## 2018-10-11 DIAGNOSIS — E119 Type 2 diabetes mellitus without complications: Secondary | ICD-10-CM

## 2018-10-11 DIAGNOSIS — I10 Essential (primary) hypertension: Secondary | ICD-10-CM

## 2018-10-11 DIAGNOSIS — E785 Hyperlipidemia, unspecified: Secondary | ICD-10-CM

## 2018-10-11 NOTE — Telephone Encounter (Signed)
Last seen 05/03/18 and last lipid 06/28/16

## 2018-10-18 ENCOUNTER — Encounter: Payer: Self-pay | Admitting: Emergency Medicine

## 2018-10-18 ENCOUNTER — Ambulatory Visit (INDEPENDENT_AMBULATORY_CARE_PROVIDER_SITE_OTHER): Payer: Medicare Other | Admitting: Emergency Medicine

## 2018-10-18 DIAGNOSIS — Z23 Encounter for immunization: Secondary | ICD-10-CM

## 2018-10-18 DIAGNOSIS — G4733 Obstructive sleep apnea (adult) (pediatric): Secondary | ICD-10-CM | POA: Diagnosis not present

## 2018-10-18 DIAGNOSIS — J9611 Chronic respiratory failure with hypoxia: Secondary | ICD-10-CM

## 2018-10-18 DIAGNOSIS — J449 Chronic obstructive pulmonary disease, unspecified: Secondary | ICD-10-CM | POA: Diagnosis not present

## 2018-10-18 NOTE — Assessment & Plan Note (Signed)
Doing quite well.  He is significant he benefited from change to Trelegy from [facility].  We will continue the Trelegy.  Minimal albuterol use.  No flares even though had a recent URI.  Pneumonia shot up-to-date.  Please continue Trelegy 1 inhalation daily.  Remember to rinse and gargle after using. Keep your albuterol available use 2 puffs if needed for shortness of breath, wheezing, chest tightness. Flu shot today. Pneumonia shot up-to-date

## 2018-10-18 NOTE — Assessment & Plan Note (Signed)
Keep your oxygen available use 2 L/min with exertion as needed.

## 2018-10-18 NOTE — Progress Notes (Signed)
Subjective:    Patient ID: SHANDY VI, male    DOB: 03/31/1941, 77 y.o.   MRN: [id number]  COPD  He complains of shortness of breath. There is no cough or wheezing. Pertinent negatives include no ear pain, fever, headaches, postnasal drip, rhinorrhea, sneezing, sore throat or trouble swallowing. His past medical history is significant for COPD.   77 year old man with a history of severe COPD, exertional hypoxemia, obstructive sleep apnea, diabetes mellitus. He has chronic exertional hypoxemia, uses oxygen as needed. Chronic bronchodilator is Anoro. Tells me that he wears his CPAP approximately every night - feels a benefit, better rested, less sleepy the next day. He recently went to [facility] and was able to walk a lot, used his O2 when he got back to the room. He feels that his breathing may be a bit better. He has lost a few lbs. No flares, no hospitalizations. He did have some dyspnea after gardening mid-week last week. Uses albuterol, not every day.   ROV 10/20/17 --follow-up visit for severe COPD, obstructive sleep apnea and exertional hypoxemia. He is on Anoro, feels that he benefits. He is using albuterol as needed - sometimes none, sometimes 2-3 times a day.  His last flare was over a year ago. He rarely uses his O2 despite documented exertional desats.  Offered him the flu shot today.  He does not want to get it.       ROV 04/19/18 --77 year old gentleman with severe COPD, associated chronic hypoxemic respiratory failure with exertional desaturations. He uses his O2 after he exerts and has made him tired, desaturated.  Also with obstructive sleep apnea, wears CPAP reliably.  He has been managed on Anoro.  He presents today reporting that he has been doing fairly well. No flares since last time. He uses albuterol about every day.  He is interested in possibly trying trelegy. Occasional wheeze, no cough.   ROV 10/18/18 --patient follows up today for his history of severe COPD with obstructive  sleep apnea, chronic hypoxemic respiratory failure. We changed him to Trelegy and he has benefited - less dyspnea, better exertional tolerance. He is working in the yard again!  No cough, wheeze. Minimal albuterol use. He is not needing O2 w exertion as much - checks his Spo2.  Had a recent URI but has improved without flaring. No flares since last time.  PNA shot up to date. Uses CPAP reliably, has some benefit during the day - better energy.                                                                 Review of Systems  Constitutional: Negative for fever and unexpected weight change.  HENT: Negative for congestion, dental problem, ear pain, nosebleeds, postnasal drip, rhinorrhea, sinus pressure, sneezing, sore throat and trouble swallowing.   Eyes: Negative for redness and itching.  Respiratory: Positive for shortness of breath. Negative for cough, chest tightness and wheezing.   Cardiovascular: Negative for palpitations and leg swelling.  Gastrointestinal: Negative for nausea and vomiting.  Genitourinary: Negative for dysuria.  Musculoskeletal: Negative for joint swelling.       Pain and stiffness  Skin: Negative for rash.  Neurological: Negative for headaches.  Hematological: Does not bruise/bleed easily.  Psychiatric/Behavioral: Negative for dysphoric mood  and suicidal ideas. The patient is not nervous/anxious.       Objective:   Physical Exam Vitals:   10/18/18 0903  BP: 126/84  Pulse: 75  SpO2: 92%  Weight: 219 lb (99.3 kg)  Height: 5\' 5"  (1.651 m)   Gen: Pleasant, overweight, in no distress,  normal affect  ENT: No lesions,  mouth clear,  oropharynx clear, no postnasal drip  Neck: No JVD, no stridor  Lungs: No use of accessory muscles, distant, no wheeze today, rare cough  Cardiovascular: RRR, heart sounds normal, no murmur or gallops, no peripheral edema  Musculoskeletal: No deformities, no cyanosis or clubbing  Neuro: alert, non focal  Skin: Warm, no lesions  or rashes     Assessment & Plan:  COPD (chronic obstructive pulmonary disease) Doing quite well.  He is significant he benefited from change to Trelegy from [facility].  We will continue the Trelegy.  Minimal albuterol use.  No flares even though had a recent URI.  Pneumonia shot up-to-date.  Please continue Trelegy 1 inhalation daily.  Remember to rinse and gargle after using. Keep your albuterol available use 2 puffs if needed for shortness of breath, wheezing, chest tightness. Flu shot today. Pneumonia shot up-to-date  Obstructive sleep apnea Good compliance and good clinical benefit confirmed today  Continue CPAP every night.   Chronic hypoxemic respiratory failure Keep your oxygen available use 2 L/min with exertion as needed.  Baltazar Apo, MD, PhD 10/18/2018, 9:23 AM [place] [facility] [phone] or if no answer [phone]

## 2018-10-18 NOTE — Patient Instructions (Signed)
Please continue Trelegy 1 inhalation daily.  Remember to rinse and gargle after using. Keep your albuterol available use 2 puffs if needed for shortness of breath, wheezing, chest tightness. Flu shot today. Pneumonia shot up-to-date Keep your oxygen available use 2 L/min with exertion as needed. Continue CPAP every night. Follow with Dr Lamonte Sakai in 6 months or sooner if you have any problems

## 2018-10-18 NOTE — Assessment & Plan Note (Signed)
Good compliance and good clinical benefit confirmed today  Continue CPAP every night.

## 2018-11-03 ENCOUNTER — Ambulatory Visit: Payer: Medicare Other | Admitting: Pediatrics

## 2018-11-08 ENCOUNTER — Ambulatory Visit (INDEPENDENT_AMBULATORY_CARE_PROVIDER_SITE_OTHER): Payer: Medicare Other | Admitting: Pediatrics

## 2018-11-08 ENCOUNTER — Encounter: Payer: Self-pay | Admitting: Pediatrics

## 2018-11-08 VITALS — BP 130/85 | HR 70 | Temp 97.3°F | Ht 65.0 in | Wt 222.4 lb

## 2018-11-08 DIAGNOSIS — E119 Type 2 diabetes mellitus without complications: Secondary | ICD-10-CM | POA: Diagnosis not present

## 2018-11-08 DIAGNOSIS — I10 Essential (primary) hypertension: Secondary | ICD-10-CM

## 2018-11-08 DIAGNOSIS — E785 Hyperlipidemia, unspecified: Secondary | ICD-10-CM

## 2018-11-08 LAB — BASIC METABOLIC PANEL
BUN/Creatinine Ratio: 14 (ref 10–24)
BUN: 13 mg/dL (ref 8–27)
CO2: 28 mmol/L (ref 20–29)
Calcium: 9.3 mg/dL (ref 8.6–10.2)
Chloride: 98 mmol/L (ref 96–106)
Creatinine, Ser: 0.96 mg/dL (ref 0.76–1.27)
GFR calc Af Amer: 89 mL/min/{1.73_m2} (ref >59)
GFR calc non Af Amer: 77 mL/min/{1.73_m2} (ref >59)
Glucose: 136 mg/dL — ABNORMAL HIGH (ref 65–99)
Potassium: 4.7 mmol/L (ref 3.5–5.2)
Sodium: 142 mmol/L (ref 134–144)

## 2018-11-08 LAB — BAYER DCA HB A1C WAIVED: HB A1C: 6.9 % (ref <7.0)

## 2018-11-08 MED ORDER — DULAGLUTIDE 1.5 MG/0.5ML ~~LOC~~ SOAJ: SUBCUTANEOUS | 2 refills | Status: DC

## 2018-11-08 MED ORDER — PRAVASTATIN SODIUM 40 MG PO TABS: 40.0000 mg | ORAL_TABLET | Freq: Every day | ORAL | 1 refills | Status: DC

## 2018-11-08 MED ORDER — METFORMIN HCL 500 MG PO TABS: 1000.0000 mg | ORAL_TABLET | Freq: Two times a day (BID) | ORAL | 1 refills | Status: DC

## 2018-11-08 MED ORDER — LISINOPRIL 20 MG PO TABS: 20.0000 mg | ORAL_TABLET | Freq: Every day | ORAL | 1 refills | Status: DC

## 2018-11-08 NOTE — Progress Notes (Signed)
  Subjective:   Patient ID: Brent Snow, male    DOB: 1941/01/29, 77 y.o.   MRN: [id number] CC: Medical Management of Chronic Issues  HPI: Brent Snow is a 77 y.o. male   Diabetes: Has been taking medicines regularly.  Walking is much as he can.  Trying to avoid sugary foods.  COPD, chronic hypoxic exertional hypoxemia: Not needing oxygen is much as he used to.  Follows with pulmonology.  He thinks the Trelegy has been helping a lot.  OSA: Continues to wear CPAP regularly.  He feels like his energy levels have been fairly good.  Relevant past medical, surgical, family and social history reviewed. Allergies and medications reviewed and updated. Social History   Tobacco Use  Smoking Status Former Smoker  . Packs/day: 2.00  . Years: 40.00  . Pack years: 80.00  . Types: Cigarettes  . Last attempt to quit: 12/06/2006  . Years since quitting: 11.9  Smokeless Tobacco Never Used   ROS: Per HPI   Objective:    BP 130/85   Pulse 70   Temp (!) 97.3 F (36.3 C) (Oral)   Ht 5\' 5"  (1.651 m)   Wt 222 lb 6.4 oz (100.9 kg)   BMI 37.01 kg/m   Wt Readings from Last 3 Encounters:  11/08/18 222 lb 6.4 oz (100.9 kg)  10/18/18 219 lb (99.3 kg)  05/03/18 220 lb 3.2 oz (99.9 kg)    Gen: NAD, alert, cooperative with exam, NCAT EYES: EOMI, no conjunctival injection, or no icterus ENT:  TMs pearly gray b/l, OP without erythema LYMPH: no cervical LAD CV: NRRR, normal S1/S2, no murmur, distal pulses 2+ b/l Resp: CTABL, no wheezes, normal WOB Abd: +BS, soft, NTND.  Ext: No edema, warm Neuro: Alert and oriented MSK: normal muscle bulk  Assessment & Plan:  Emmet was seen today for medical management of chronic issues.  Diagnoses and all orders for this visit:  Type 2 diabetes mellitus without complication, without long-term current use of insulin (HCC) A1c 6.9, down from 7.3.  Continue current medicines.  Continue to avoid sugary foods.  Activity as able -     Bayer DCA Hb  A1c Waived -     Microalbumin / creatinine urine ratio -     metFORMIN (GLUCOPHAGE) 500 MG tablet; Take 2 tablets (1,000 mg total) by mouth 2 (two) times daily with a meal. -     Dulaglutide (TRULICITY) 1.5 WU/9.8JX SOPN; INJECT 0.5ML (=1.5 MG)     SUBCUTANEOUSLY ONCE WEEKLY -     pravastatin (PRAVACHOL) 40 MG tablet; Take 1 tablet (40 mg total) by mouth daily.  Essential hypertension Stable, continue current medicines -     Microalbumin / creatinine urine ratio -     lisinopril (PRINIVIL,ZESTRIL) 20 MG tablet; Take 1 tablet (20 mg total) by mouth daily. -     Basic Metabolic Panel  Hyperlipidemia, unspecified hyperlipidemia type Stable, continue current medicine -     Microalbumin / creatinine urine ratio -     pravastatin (PRAVACHOL) 40 MG tablet; Take 1 tablet (40 mg total) by mouth daily.   Follow up plan: Return in about 6 months (around 05/10/2019). Brent Found, MD [place]

## 2019-01-30 ENCOUNTER — Ambulatory Visit (INDEPENDENT_AMBULATORY_CARE_PROVIDER_SITE_OTHER): Payer: Medicare Other | Admitting: Emergency Medicine

## 2019-01-30 ENCOUNTER — Encounter: Payer: Self-pay | Admitting: Emergency Medicine

## 2019-01-30 DIAGNOSIS — J9611 Chronic respiratory failure with hypoxia: Secondary | ICD-10-CM

## 2019-01-30 DIAGNOSIS — J449 Chronic obstructive pulmonary disease, unspecified: Secondary | ICD-10-CM | POA: Diagnosis not present

## 2019-01-30 MED ORDER — FLUTICASONE-UMECLIDIN-VILANT 100-62.5-25 MCG/INH IN AEPB: 1.0000 | INHALATION_SPRAY | Freq: Every day | RESPIRATORY_TRACT | 3 refills | Status: DC

## 2019-01-30 MED ORDER — ALBUTEROL SULFATE HFA 108 (90 BASE) MCG/ACT IN AERS: 2.0000 | INHALATION_SPRAY | RESPIRATORY_TRACT | 0 refills | Status: DC | PRN

## 2019-01-30 NOTE — Patient Instructions (Signed)
We will continue Trelegy 1 inhalation once daily.  Remember to rinse and gargle after you use this. If your albuterol available to use 2 puffs if needed for shortness of breath, chest tightness, wheezing. Flu shot and pneumonia shot are both up-to-date. Continue your CPAP every night. Follow with Dr. Lamonte Sakai in 12 months or sooner if you have any problems.

## 2019-01-30 NOTE — Assessment & Plan Note (Signed)
Continue your CPAP every night.

## 2019-01-30 NOTE — Assessment & Plan Note (Signed)
He has not been wearing his O2 - checks has SpO2 and no desaturations either at rest or w exertion.

## 2019-01-30 NOTE — Assessment & Plan Note (Signed)
We will continue Trelegy 1 inhalation once daily.  Remember to rinse and gargle after you use this. If your albuterol available to use 2 puffs if needed for shortness of breath, chest tightness, wheezing. Flu shot and pneumonia shot are both up-to-date. Follow with Dr. Lamonte Sakai in 12 months or sooner if you have any problems.

## 2019-01-30 NOTE — Progress Notes (Signed)
Subjective:    Patient ID: Brent Snow, male    DOB: 1941/08/07, 78 y.o.   MRN: [id number]  COPD  He complains of shortness of breath. There is no cough or wheezing. Pertinent negatives include no ear pain, fever, headaches, postnasal drip, rhinorrhea, sneezing, sore throat or trouble swallowing. His past medical history is significant for COPD.   PMH:  severe COPD, exertional hypoxemia, obstructive sleep apnea, diabetes mellitus. He has chronic exertional hypoxemia, uses oxygen as needed.       ROV 10/18/18 --patient follows up today for his history of severe COPD with obstructive sleep apnea, chronic hypoxemic respiratory failure. We changed him to Trelegy and he has benefited - less dyspnea, better exertional tolerance. He is working in the yard again!  No cough, wheeze. Minimal albuterol use. He is not needing O2 w exertion as much - checks his Spo2.  Had a recent URI but has improved without flaring. No flares since last time.  PNA shot up to date. Uses CPAP reliably, has some benefit during the day - better energy.   ROV 01/30/2019 --78 year old gentleman who follows up today for management of his severe COPD, OSA, chronic hypoxemic respiratory failure.  He is currently managed on Trelegy.  He has albuterol and uses it rarely - not for several months. He stays busy, likes to golf. He is good about CPAP compliance, feels that he is benefiting, less daytime sleepiness, more energy. He is up to date on flu shot. PNA shot up to date.                                                               Review of Systems  Constitutional: Negative for fever and unexpected weight change.  HENT: Negative for congestion, dental problem, ear pain, nosebleeds, postnasal drip, rhinorrhea, sinus pressure, sneezing, sore throat and trouble swallowing.   Eyes: Negative for redness and itching.  Respiratory: Positive for shortness of breath. Negative for cough, chest tightness and wheezing.    Cardiovascular: Negative for palpitations and leg swelling.  Gastrointestinal: Negative for nausea and vomiting.  Genitourinary: Negative for dysuria.  Musculoskeletal: Negative for joint swelling.       Pain and stiffness  Skin: Negative for rash.  Neurological: Negative for headaches.  Hematological: Does not bruise/bleed easily.  Psychiatric/Behavioral: Negative for dysphoric mood and suicidal ideas. The patient is not nervous/anxious.       Objective:   Physical Exam Vitals:   01/30/19 1443  Pulse: 83  SpO2: 92%  Weight: 227 lb (103 kg)  Height: 5\' 5"  (1.651 m)   Gen: [place], overweight, in no distress,  normal affect  ENT: No lesions,  mouth clear,  oropharynx clear, no postnasal drip  Neck: No JVD, no stridor  Lungs: No use of accessory muscles, distant, no wheezing  Cardiovascular: RRR, heart sounds normal, no murmur or gallops, no peripheral edema  Musculoskeletal: No deformities, no cyanosis or clubbing  Neuro: alert, non focal  Skin: Warm, no lesions or rashes     Assessment & Plan:  COPD (chronic obstructive pulmonary disease) We will continue Trelegy 1 inhalation once daily.  Remember to rinse and gargle after you use this. If your albuterol available to use 2 puffs if needed for shortness of breath, chest tightness, wheezing.  Flu shot and pneumonia shot are both up-to-date. Follow with Dr. Lamonte Sakai in 12 months or sooner if you have any problems.   Obstructive sleep apnea Continue your CPAP every night.  Chronic hypoxemic respiratory failure He has not been wearing his O2 - checks has SpO2 and no desaturations either at rest or w exertion.   Baltazar Apo, MD, PhD 01/30/2019, 3:01 PM [facility] [phone] or if no answer [phone]

## 2019-03-05 ENCOUNTER — Other Ambulatory Visit: Payer: Self-pay

## 2019-03-05 ENCOUNTER — Ambulatory Visit (INDEPENDENT_AMBULATORY_CARE_PROVIDER_SITE_OTHER): Payer: Medicare Other | Admitting: Family Medicine

## 2019-03-05 ENCOUNTER — Encounter: Payer: Self-pay | Admitting: Family Medicine

## 2019-03-05 VITALS — BP 138/83 | HR 79 | Temp 97.3°F | Ht 65.0 in | Wt 218.0 lb

## 2019-03-05 DIAGNOSIS — M51369 Other intervertebral disc degeneration, lumbar region without mention of lumbar back pain or lower extremity pain: Secondary | ICD-10-CM

## 2019-03-05 DIAGNOSIS — R29898 Other symptoms and signs involving the musculoskeletal system: Secondary | ICD-10-CM

## 2019-03-05 DIAGNOSIS — M5416 Radiculopathy, lumbar region: Secondary | ICD-10-CM

## 2019-03-05 DIAGNOSIS — M5136 Other intervertebral disc degeneration, lumbar region: Secondary | ICD-10-CM

## 2019-03-05 MED ORDER — GABAPENTIN 100 MG PO CAPS: 100.0000 mg | ORAL_CAPSULE | Freq: Every evening | ORAL | 0 refills | Status: DC | PRN

## 2019-03-05 NOTE — Progress Notes (Signed)
Subjective: CC: back pain PCP: Janora Norlander, DO HPI: Patient is a 78 y.o. male presenting to clinic today for back pain. Concerns today include:  1. Back Pain Patient reports that pain began several years ago but has been in flare over the last month.  The pain radiates down the right lower extremity.  Sometimes it is severe.  It seems to be worse at the end of the day but sometimes is present when he wakes up in the morning. H e reports a h/o back pain that is been ongoing since he had surgical intervention for a slipped disc in his back.  His previous back pain had radiated down the left lower extremity.  He notes that a piece of his disc had broken off and severed a nerve.  This surgery was performed in [place] per his report.  Patient has been taking Motrin and old Norco that he had left over from a dental appointment for pain with minimal relief.  Patient denies trauma or injury.  Denies saddle anesthesia, urinary retention/incontinence, bowel incontinence, weakness, falls, sensation change   Current Outpatient Medications:  .  albuterol (PROAIR HFA) 108 (90 Base) MCG/ACT inhaler, Inhale 2 puffs into the lungs every 4 (four) hours as needed., Disp: 3 Inhaler, Rfl: 0 .  Dulaglutide (TRULICITY) 1.5 OM/3.5DH SOPN, INJECT 0.5ML (=1.5 MG)     SUBCUTANEOUSLY ONCE WEEKLY, Disp: 6 mL, Rfl: 2 .  Fluticasone-Umeclidin-Vilant (TRELEGY ELLIPTA) 100-62.5-25 MCG/INH AEPB, Inhale 1 puff into the lungs daily., Disp: 3 each, Rfl: 3 .  lisinopril (PRINIVIL,ZESTRIL) 20 MG tablet, Take 1 tablet (20 mg total) by mouth daily., Disp: 90 tablet, Rfl: 1 .  metFORMIN (GLUCOPHAGE) 500 MG tablet, Take 2 tablets (1,000 mg total) by mouth 2 (two) times daily with a meal., Disp: 360 tablet, Rfl: 1 .  pravastatin (PRAVACHOL) 40 MG tablet, Take 1 tablet (40 mg total) by mouth daily., Disp: 90 tablet, Rfl: 1 .  sildenafil (REVATIO) 20 MG tablet, TAKE 2 TABLETS BY MOUTH AS NEEDED, Disp: , Rfl: 1 .  glucose blood  test strip, Check BS QD and PRN, Disp: 100 each, Rfl: 5 .  Ibuprofen 200 MG CAPS, Take 2 capsules by mouth as needed. , Disp: , Rfl:  .  Lancets (ONETOUCH ULTRASOFT) lancets, , Disp: , Rfl:  .  OXYGEN, Inhale into the lungs. As directed, Disp: , Rfl:  No Known Allergies  Past Medical History:  Diagnosis Date  . Arthritis   . COPD, severe ([place])    pulmologist-  dr byrum  . Dyspnea on exertion    prescribed to use O2 via [place]--- per pt he does not use O2 he just take 1-2 puffs of rescue inhaler  . Hiatal hernia   . 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 Hypertension   . Hypoxemic respiratory failure, chronic ([place])    pulmologist-  dr byrum-- prescribed supplemental O2 @ 2L via [place]   . OSA on CPAP    and added O2 @ 2L via [place] w/ cpap  . Phimosis   . S/P balloon dilatation of esophageal stricture    multiple   . Type 2 diabetes mellitus ([place])    followed by pcp--  per note uncontrolled w/ last A1c >14 on 12-30-2016  . Wears contact lenses    Social History   Socioeconomic History  . Marital status: Married    Spouse name: Not on  file  . Number of children: Not on file  . Years of education: Not on file  . Highest education level: Not on file  Occupational History  . Not on file  Social Needs  . Financial resource strain: Not on file  . Food insecurity:    Worry: Not on file    Inability: Not on file  . Transportation needs:    Medical: Not on file    Non-medical: Not on file  Tobacco Use  . Smoking status: Former Smoker    Packs/day: 2.00    Years: 40.00    Pack years: 80.00    Types: Cigarettes    Last attempt to quit: 12/06/2006    Years since quitting: 12.2  . Smokeless tobacco: Never Used  Substance and Sexual Activity  . Alcohol use: Yes    Alcohol/week: 0.0 standard drinks    Comment: VERY LITTLE  . Drug use: No  . Sexual activity: Not on file  Lifestyle  . Physical activity:    Days per week: Not  on file    Minutes per session: Not on file  . Stress: Not on file  Relationships  . Social connections:    Talks on phone: Not on file    Gets together: Not on file    Attends religious service: Not on file    Active member of club or organization: Not on file    Attends meetings of clubs or organizations: Not on file    Relationship status: Not on file  . Intimate partner violence:    Fear of current or ex partner: Not on file    Emotionally abused: Not on file    Physically abused: Not on file    Forced sexual activity: Not on file  Other Topics Concern  . Not on file  Social History Narrative  . Not on file   Past Surgical History:  Procedure Laterality Date  . BACK SURGERY    . CIRCUMCISION N/A 01/27/2017   Procedure: CIRCUMCISION ADULT;  Surgeon: Franchot Gallo, MD;  [facility];  Service: Urology;  Laterality: N/A;  . KNEE ARTHROSCOPY Right 2008  . LUMBAR SPINE SURGERY  1977  approx.  . TONSILLECTOMY AND ADENOIDECTOMY  age 75  . TRANSTHORACIC ECHOCARDIOGRAM  09/12/2015   mild LVH,  ef 65-70%,  grade 1 diastolic dysfunction/  mild RAE    ROS: per HPI  Objective: Office vital signs reviewed. BP 138/83   Pulse 79   Temp (!) 97.3 F (36.3 C) (Oral)   Ht 5\' 5"  (1.651 m)   Wt 218 lb (98.9 kg)   BMI 36.28 kg/m   Physical Examination:  General: Awake, alert, obese, NAD Extremities: Warm, well-perfused. No edema, cyanosis or clubbing; +2 pulses bilaterally MSK: antlagic gait and station  Lumbar Spine: limited AROM, no midline tenderness to palpation, + paraspinal tenderness to palpation along the lower lumbar region on the right.  No palpable bony deformities,  POSITIVE straight leg test Neuro: 3/5 lower extremity strength in hip flexion due to pain. Lower extremity light touch sensation grossly intact, normal heel walk. Cannot perform Toe Walk  Assessment/ Plan: EMERSYN KOTARSKI is a 78 y.o. male here with  1. Degenerative disc  disease, lumbar I suspect that he has had recurrence of herniated disc given positive straight leg raise and inability to perform toe walk.  I question an L5 impingement.  He is currently not having severe pain and therefore no steroids were pursued.  I  have given him gabapentin and given him instructions on how to take the medication.  He may start with 100 mg nightly for the next several days.  If he tolerates this well and his pain is uncontrolled he may increase to 200 mg for a few days and then to 300 mg.  Caution sedation.  We discussed red flag signs and symptoms.  I have placed a referral to orthopedic surgery, hopefully one that has their own MRI as I understand that the [facility] it outbreak is limiting access to MRIs in the hospital. - Ambulatory referral to Orthopedic Surgery  2. Weakness of right lower extremity As above - Ambulatory referral to Orthopedic Surgery  3. Lumbar radiculopathy As above - Ambulatory referral to Orthopedic Surgery - gabapentin (NEURONTIN) 100 MG capsule; Take 1-3 capsules (100-300 mg total) by mouth at bedtime as needed (pain).  Dispense: 90 capsule; Refill: [place], DO [facility]

## 2019-03-05 NOTE — Patient Instructions (Signed)
I have sent in Gabapentin to the pharmacy for the pain you are experiencing.  I have placed a referral to the orthopedist.  Carlon will contact you as soon as she is able to secure an appointment for you at a center that has MRI on site.   Herniated Disk  A herniated disk, also called a ruptured disk or slipped disk, occurs when a disk in the spine bulges out too far. Between the bones in the spine (vertebrae), there are oval disks that are made of a soft, spongy center that is surrounded by a tough outer ring. The disks connect your vertebrae, help your spine move, and absorb shocks from your movement. When you have a herniated disk, the spongy center of the disk bulges out or breaks through the outer ring. It can press on a nerve between the vertebrae and cause pain. This can occur anywhere in the back or neck area, but the lower back is most commonly affected. What are the causes? This condition may be caused by:  Age-related wear and tear. The spongy centers of spinal disks tend to shrink and dry out with age, which makes them more likely to herniate.  Sudden injury, such as a strain or sprain. What increases the risk? Aging is the main risk factor for a herniated disk. Other risk factors include:  Being a man who is 82-66 years old.  Frequently doing activities that involve heavy lifting, bending, or twisting.  Frequently driving for long hours at a time.  Not getting enough exercise.  Being overweight.  Smoking.  Having a family history of back problems or herniated disks.  Being pregnant or giving birth.  Having poor nutrition.  Being tall. What are the signs or symptoms? Symptoms may vary depending on where your herniated disk is located.  A herniated disk in the lower back may cause sharp pain in: ? Part of the arm, leg, hip, or buttocks. ? The back of the lower leg (calf). ? The lower back, spreading down through the leg into the foot (sciatica).  A herniated disk  in the neck may cause dizziness and vertigo. It may also cause pain or weakness in: ? The neck. ? The shoulder blades. ? Upper arm, forearm, or fingers.  You may also have muscle weakness. It may be difficult to: ? Lift your leg or arm. ? Stand on your toes. ? Squeeze tightly with one of your hands.  Other symptoms may include: ? Numbness or tingling in the affected areas of the hands, arms, feet, or legs. ? Inability to control when you urinate or when you have bowel movements. This is a rare but serious sign of a severe herniated disk in the lower back. How is this diagnosed? This condition may be diagnosed based on:  Your symptoms.  Your medical history.  A physical exam. The exam may include: ? Straight-leg test. You will lie on your back while your health care provider lifts your leg, keeping your knee straight. If you feel pain, you likely have a herniated disk. ? Neurological tests. This includes checking for numbness, reflexes, muscle strength, and posture.  Imaging tests, such as: ? X-rays. ? MRI. ? CT scan. ? Electromyogram (EMG) to check the nerves that control muscles. This test may be used to determine which nerves are affected by your herniated disk. How is this treated? Treatment for this condition may include:  A short period of rest. This is usually the first treatment. ? You may be  on bed rest for up to 2 days, or you may be instructed to stay home and avoid physical activity. ? If you have a herniated disk in your lower back, avoid sitting as much as possible. Sitting increases pressure on the disk.  Medicines. These may include: ? NSAIDs to help reduce pain and swelling. ? Muscle relaxants to prevent sudden tightening of the back muscles (back spasms). ? Prescription pain medicines, if you have severe pain.  Steroid injections in the area of the herniated disk. This can help reduce pain and swelling.  Physical therapy to strengthen your back muscles. In  many cases, symptoms go away with treatment over a period of days or weeks. You will most likely be free of symptoms after 3-4 months. If other treatments do not help to relieve your symptoms, you may need surgery. Follow these instructions at home: Medicines  Take over-the-counter and prescription medicines only as told by your health care provider.  Do not drive or use heavy machinery while taking prescription pain medicine. Activity  Rest as directed.  After your rest period: ? Return to your normal activities and gradually begin exercising as told by your health care provider. Ask your health care provider what activities and exercises are safe for you. ? Use good posture. ? Avoid movements that cause pain. ? Do not lift anything that is heavier than 10 lb (4.5 kg) until your health care provider says this is safe. ? Do not sit or stand for long periods of time without changing positions. ? Do not sit for long periods of time without getting up and moving around.  If physical therapy was prescribed, do exercises as instructed.  Aim to strengthen muscles in your back and abdomen with exercises like crunches, swimming, or walking. General instructions  Do not use any products that contain nicotine or tobacco, such as cigarettes and e-cigarettes. These products can delay healing. If you need help quitting, ask your health care provider.  Do not wear high-heeled shoes.  Do not sleep on your belly.  If you are overweight, work with your health care provider to lose weight safely.  To prevent or treat constipation while you are taking prescription pain medicine, your health care provider may recommend that you: ? Drink enough fluid to keep your urine clear or pale yellow. ? Take over-the-counter or prescription medicines. ? Eat foods that are high in fiber, such as fresh fruits and vegetables, whole grains, and beans. ? Limit foods that are high in fat and processed sugars, such as  fried and sweet foods.  Keep all follow-up visits as told by your health care provider. This is important. How is this prevented?   Maintain a healthy weight.  Try to avoid stressful situations.  Maintain physical fitness. Do at least 150 minutes of moderate-intensity exercise each week, such as brisk walking or water aerobics.  When lifting objects: ? Keep your feet at least shoulder-width apart and tighten your abdominal muscles. ? Keep your spine neutral as you bend your knees and hips. It is important to lift using the strength of your legs, not your back. Do not lock your knees straight out. ? Always ask for help to lift heavy or awkward objects. Contact a health care provider if:  You have back pain or neck pain that does not get better after 6 weeks.  You have severe pain in your back, neck, legs, or arms.  You develop numbness, tingling, or weakness in any part of your  body. Get help right away if:  You cannot move your arms or legs.  You cannot control when you urinate or have bowel movements.  You feel dizzy or you faint.  You have shortness of breath. This information is not intended to replace advice given to you by your health care provider. Make sure you discuss any questions you have with your health care provider. Document Released: 11/19/2000 Document Revised: 07/19/2016 Document Reviewed: 07/19/2016 Elsevier Interactive Patient Education  [employer].

## 2019-03-06 ENCOUNTER — Telehealth: Payer: Self-pay | Admitting: Family Medicine

## 2019-03-08 NOTE — Telephone Encounter (Signed)
Pt informed referral made to facility that has on site MRI and to be expecting a  Call from them for a consultation first, then MRI if medically necessary. Referral sent to [facility]

## 2019-03-09 DIAGNOSIS — M545 Low back pain: Secondary | ICD-10-CM | POA: Diagnosis not present

## 2019-04-05 DIAGNOSIS — M48061 Spinal stenosis, lumbar region without neurogenic claudication: Secondary | ICD-10-CM | POA: Diagnosis not present

## 2019-04-28 ENCOUNTER — Other Ambulatory Visit: Payer: Self-pay | Admitting: Emergency Medicine

## 2019-05-02 ENCOUNTER — Other Ambulatory Visit: Payer: Self-pay

## 2019-05-03 ENCOUNTER — Encounter: Payer: Self-pay | Admitting: Family Medicine

## 2019-05-03 ENCOUNTER — Encounter (INDEPENDENT_AMBULATORY_CARE_PROVIDER_SITE_OTHER): Payer: Self-pay

## 2019-05-03 ENCOUNTER — Ambulatory Visit (INDEPENDENT_AMBULATORY_CARE_PROVIDER_SITE_OTHER): Payer: Medicare Other | Admitting: Family Medicine

## 2019-05-03 VITALS — BP 115/66 | HR 72 | Temp 97.8°F | Ht 65.0 in | Wt 212.0 lb

## 2019-05-03 DIAGNOSIS — M5136 Other intervertebral disc degeneration, lumbar region: Secondary | ICD-10-CM

## 2019-05-03 DIAGNOSIS — E785 Hyperlipidemia, unspecified: Secondary | ICD-10-CM | POA: Diagnosis not present

## 2019-05-03 DIAGNOSIS — E1159 Type 2 diabetes mellitus with other circulatory complications: Secondary | ICD-10-CM | POA: Diagnosis not present

## 2019-05-03 DIAGNOSIS — D582 Other hemoglobinopathies: Secondary | ICD-10-CM | POA: Diagnosis not present

## 2019-05-03 DIAGNOSIS — E119 Type 2 diabetes mellitus without complications: Secondary | ICD-10-CM

## 2019-05-03 DIAGNOSIS — I10 Essential (primary) hypertension: Secondary | ICD-10-CM

## 2019-05-03 DIAGNOSIS — E1169 Type 2 diabetes mellitus with other specified complication: Secondary | ICD-10-CM | POA: Diagnosis not present

## 2019-05-03 DIAGNOSIS — Z1329 Encounter for screening for other suspected endocrine disorder: Secondary | ICD-10-CM | POA: Diagnosis not present

## 2019-05-03 DIAGNOSIS — Z79891 Long term (current) use of opiate analgesic: Secondary | ICD-10-CM | POA: Diagnosis not present

## 2019-05-03 DIAGNOSIS — I152 Hypertension secondary to endocrine disorders: Secondary | ICD-10-CM

## 2019-05-03 LAB — LIPID PANEL

## 2019-05-03 LAB — BAYER DCA HB A1C WAIVED: HB A1C (BAYER DCA - WAIVED): 6.3 % (ref <7.0)

## 2019-05-03 MED ORDER — METFORMIN HCL 500 MG PO TABS: 1000.0000 mg | ORAL_TABLET | Freq: Two times a day (BID) | ORAL | 1 refills | Status: DC

## 2019-05-03 MED ORDER — PRAVASTATIN SODIUM 40 MG PO TABS: 40.0000 mg | ORAL_TABLET | Freq: Every day | ORAL | 1 refills | Status: DC

## 2019-05-03 MED ORDER — DULAGLUTIDE 1.5 MG/0.5ML ~~LOC~~ SOAJ: SUBCUTANEOUS | 2 refills | Status: DC

## 2019-05-03 MED ORDER — LISINOPRIL 20 MG PO TABS: 20.0000 mg | ORAL_TABLET | Freq: Every day | ORAL | 1 refills | Status: DC

## 2019-05-03 NOTE — Progress Notes (Signed)
Subjective: CC: f/u DM, HTN PCP: Janora Norlander, DO TKP:Brent Snow is a 78 y.o. male presenting to clinic today for:  1. Type 2 Diabetes w/ HTN and HLD:  Patient reports compliance with Trulicity, metformin, Pravachol and lisinopril.  He reports being physically active as able, he has COPD.  He also is working on [employer] with his wife.  Last eye exam: He saw Dr. Truman Hayward at [facility] a couple of months ago.  He was told that he did not have any retinopathy but he might have early cataracts Last foot exam: 11/08/2018 Last A1c:  Lab Results  Component Value Date   HGBA1C 6.9 11/08/2018   Nephropathy screen indicated?: on ACE-I Last flu, zoster and/or pneumovax:  Immunization History  Administered Date(s) Administered  . Influenza, High Dose Seasonal PF 10/18/2018  . Pneumococcal Conjugate-13 10/24/2013  . Pneumococcal Polysaccharide-23 12/06/2006, 10/11/2012, 11/03/2016  . Td 05/17/2006    ROS: Denies unintended weight loss/gain, foot ulcerations, numbness or tingling in extremities or chest pain.  2.  COPD Patient is followed by Dr. West Carbo yearly.  He reports stability on Trelegy and reports rare use of the pro-air.  No shortness of breath or wheezing.  He is physically active as able.  3.  Back pain Patient reports that he was seen by 2 PAs at Dr. Valla Leaver office after our last visit and was ultimately given Norco which did seem to help his back.  He is taken a total of about 14 tablets and notes total resolution of back symptoms.  He is going to see them again today.  ROS: Per HPI  No Known Allergies Past Medical History:  Diagnosis Date  . Arthritis   . COPD, severe ([place])    pulmologist-  dr byrum  . Dyspnea on exertion    prescribed to use O2 via [place]--- per pt he does not use O2 he just take 1-2 puffs of rescue inhaler  . Hiatal hernia   . 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 Hypertension   .  Hypoxemic respiratory failure, chronic ([place])    pulmologist-  dr byrum-- prescribed supplemental O2 @ 2L via [place]   . OSA on CPAP    and added O2 @ 2L via [place] w/ cpap  . Phimosis   . S/P balloon dilatation of esophageal stricture    multiple   . Type 2 diabetes mellitus ([place])    followed by pcp--  per note uncontrolled w/ last A1c >14 on 12-30-2016  . Wears contact lenses     Current Outpatient Medications:  .  albuterol (PROAIR HFA) 108 (90 Base) MCG/ACT inhaler, Inhale 2 puffs into the lungs every 4 (four) hours as needed., Disp: 3 Inhaler, Rfl: 0 .  Dulaglutide (TRULICITY) 1.5 LE/7.5TZ SOPN, INJECT 0.5ML (=1.5 MG)     SUBCUTANEOUSLY ONCE WEEKLY, Disp: 6 mL, Rfl: 2 .  gabapentin (NEURONTIN) 100 MG capsule, Take 1-3 capsules (100-300 mg total) by mouth at bedtime as needed (pain)., Disp: 90 capsule, Rfl: 0 .  glucose blood test strip, Check BS QD and PRN, Disp: 100 each, Rfl: 5 .  Ibuprofen 200 MG CAPS, Take 2 capsules by mouth as needed. , Disp: , Rfl:  .  Lancets (ONETOUCH ULTRASOFT) lancets, , Disp: , Rfl:  .  lisinopril (PRINIVIL,ZESTRIL) 20 MG tablet, Take 1 tablet (20 mg total) by mouth daily., Disp: 90 tablet, Rfl: 1 .  metFORMIN (  GLUCOPHAGE) 500 MG tablet, Take 2 tablets (1,000 mg total) by mouth 2 (two) times daily with a meal., Disp: 360 tablet, Rfl: 1 .  OXYGEN, Inhale into the lungs. As directed, Disp: , Rfl:  .  pravastatin (PRAVACHOL) 40 MG tablet, Take 1 tablet (40 mg total) by mouth daily., Disp: 90 tablet, Rfl: 1 .  sildenafil (REVATIO) 20 MG tablet, TAKE 2 TABLETS BY MOUTH AS NEEDED, Disp: , Rfl: 1 .  TRELEGY ELLIPTA 100-62.5-25 MCG/INH AEPB, USE 1 INHALATION ORALLY    DAILY, Disp: 180 each, Rfl: 0 Social History   Socioeconomic History  . Marital status: Married    Spouse name: Not on file  . Number of children: Not on file  . Years of education: Not on file  . Highest education level: Not on file  Occupational History  . Not on file  Social Needs  . Financial  resource strain: Not on file  . Food insecurity:    Worry: Not on file    Inability: Not on file  . Transportation needs:    Medical: Not on file    Non-medical: Not on file  Tobacco Use  . Smoking status: Former Smoker    Packs/day: 2.00    Years: 40.00    Pack years: 80.00    Types: Cigarettes    Last attempt to quit: 12/06/2006    Years since quitting: 12.4  . Smokeless tobacco: Never Used  Substance and Sexual Activity  . Alcohol use: Yes    Alcohol/week: 0.0 standard drinks    Comment: VERY LITTLE  . Drug use: No  . Sexual activity: Not on file  Lifestyle  . Physical activity:    Days per week: Not on file    Minutes per session: Not on file  . Stress: Not on file  Relationships  . Social connections:    Talks on phone: Not on file    Gets together: Not on file    Attends religious service: Not on file    Active member of club or organization: Not on file    Attends meetings of clubs or organizations: Not on file    Relationship status: Not on file  . Intimate partner violence:    Fear of current or ex partner: Not on file    Emotionally abused: Not on file    Physically abused: Not on file    Forced sexual activity: Not on file  Other Topics Concern  . Not on file  Social History Narrative  . Not on file   Family History  Problem Relation Age of Onset  . Asthma Mother   . Cancer Maternal Grandmother        stomach    Objective: Office vital signs reviewed. BP 115/66   Pulse 72   Temp 97.8 F (36.6 C) (Oral)   Ht '5\' 5"'  (1.651 m)   Wt 212 lb (96.2 kg)   BMI 35.28 kg/m   Physical Examination:  General: Awake, alert, obese, No acute distress HEENT: Normal, sclera white, MMM Cardio: regular rate and rhythm, S1S2 heard, no murmurs appreciated Pulm: Globally decreased breath sounds.  No rhonchi, wheezes or rales.  Normal work of breathing on room air Extremities: warm, well perfused, No edema, cyanosis or clubbing; +2 pulses bilaterally MSK: normal  gait and station  Assessment/ Plan: 78 y.o. male   1. Type 2 diabetes mellitus without complication, without long-term current use of insulin (HCC) Under excellent control with A1c of 6.3 today.  Continue  current regimen.  I have sent his medications to the pharmacy.  He may follow-up in 6 months, sooner if needed. - Lipid Panel - Bayer DCA Hb A1c Waived - CMP14+EGFR - metFORMIN (GLUCOPHAGE) 500 MG tablet; Take 2 tablets (1,000 mg total) by mouth 2 (two) times daily with a meal.  Dispense: 360 tablet; Refill: 1 - pravastatin (PRAVACHOL) 40 MG tablet; Take 1 tablet (40 mg total) by mouth daily.  Dispense: 90 tablet; Refill: 1 - Dulaglutide (TRULICITY) 1.5 LV/9.9OZ SOPN; INJECT 0.5ML (=1.5 MG)     SUBCUTANEOUSLY ONCE WEEKLY  Dispense: 6 mL; Refill: 2  2. Hypertension associated with diabetes ([place]) Excellently control.  Continue ACE inhibitor - CMP14+EGFR - lisinopril (ZESTRIL) 20 MG tablet; Take 1 tablet (20 mg total) by mouth daily.  Dispense: 90 tablet; Refill: 1  3. Hyperlipidemia associated with type 2 diabetes mellitus ([place]) Check fasting lipid panel today.  Pravastatin renewed - Lipid Panel - CMP14+EGFR - TSH  4. Abnormal hemoglobin (HCC) Was noted to be elevated on last check.  Recheck this - CBC  5. Screening for thyroid disorder - TSH  6. Degenerative disc disease, lumbar Controlled and stable.  I would consider renewing Norco if patient needs in future   No orders of the defined types were placed in this encounter.  No orders of the defined types were placed in this encounter.    Janora Norlander, DO [place] [phone]

## 2019-05-03 NOTE — Patient Instructions (Signed)
Sugar looks great.  Keep up the good work!

## 2019-05-04 LAB — CBC
Hematocrit: 49 % (ref 37.5–51.0)
Hemoglobin: 16.1 g/dL (ref 13.0–17.7)
MCH: 31 pg (ref 26.6–33.0)
MCHC: 32.9 g/dL (ref 31.5–35.7)
MCV: 94 fL (ref 79–97)
Platelets: 156 10*3/uL (ref 150–450)
RBC: 5.19 x10E6/uL (ref 4.14–5.80)
RDW: 12.9 % (ref 11.6–15.4)
WBC: 7 10*3/uL (ref 3.4–10.8)

## 2019-05-04 LAB — LIPID PANEL
Chol/HDL Ratio: 2.6 ratio (ref 0.0–5.0)
Cholesterol, Total: 112 mg/dL (ref 100–199)
HDL: 43 mg/dL (ref >39)
LDL Calculated: 51 mg/dL (ref 0–99)
Triglycerides: 88 mg/dL (ref 0–149)
VLDL Cholesterol Cal: 18 mg/dL (ref 5–40)

## 2019-05-04 LAB — CMP14+EGFR
ALT: 19 IU/L (ref 0–44)
AST: 18 IU/L (ref 0–40)
Albumin/Globulin Ratio: 1.8 (ref 1.2–2.2)
Albumin: 4.4 g/dL (ref 3.7–4.7)
Alkaline Phosphatase: 67 IU/L (ref 39–117)
BUN/Creatinine Ratio: 23 (ref 10–24)
BUN: 27 mg/dL (ref 8–27)
Bilirubin Total: 0.4 mg/dL (ref 0.0–1.2)
CO2: 23 mmol/L (ref 20–29)
Calcium: 9.8 mg/dL (ref 8.6–10.2)
Chloride: 99 mmol/L (ref 96–106)
Creatinine, Ser: 1.18 mg/dL (ref 0.76–1.27)
GFR calc Af Amer: 69 mL/min/{1.73_m2} (ref >59)
GFR calc non Af Amer: 60 mL/min/{1.73_m2} (ref >59)
Globulin, Total: 2.4 g/dL (ref 1.5–4.5)
Glucose: 115 mg/dL — ABNORMAL HIGH (ref 65–99)
Potassium: 5.5 mmol/L — ABNORMAL HIGH (ref 3.5–5.2)
Sodium: 137 mmol/L (ref 134–144)
Total Protein: 6.8 g/dL (ref 6.0–8.5)

## 2019-05-04 LAB — TSH: TSH: 2.13 u[IU]/mL (ref 0.450–4.500)

## 2019-05-07 ENCOUNTER — Ambulatory Visit (INDEPENDENT_AMBULATORY_CARE_PROVIDER_SITE_OTHER): Payer: Medicare Other | Admitting: *Deleted

## 2019-05-07 ENCOUNTER — Other Ambulatory Visit: Payer: Self-pay

## 2019-05-07 ENCOUNTER — Encounter: Payer: Self-pay | Admitting: *Deleted

## 2019-05-07 DIAGNOSIS — Z0000 Encounter for general adult medical examination without abnormal findings: Secondary | ICD-10-CM

## 2019-05-07 NOTE — Patient Instructions (Signed)
  Brent Snow , Thank you for taking time to talk with me for your Medicare Wellness Visit. I appreciate your ongoing commitment to your health goals. Please review the following plan we discussed and let me know if I can assist you in the future.   These are the goals we discussed: Goals    . Patient Stated (pt-stated)     Maintain healthy diet and exercise regimen.       This is a list of the screening recommended for you and due dates:  Health Maintenance  Topic Date Due  . Eye exam for diabetics  02/24/2018  . Tetanus Vaccine  11/09/2019*  . Flu Shot  07/07/2019  . Hemoglobin A1C  11/03/2019  . Complete foot exam   11/09/2019  . Pneumonia vaccines  Completed  *Topic was postponed. The date shown is not the original due date.

## 2019-05-07 NOTE — Progress Notes (Signed)
MEDICARE ANNUAL WELLNESS VISIT  05/07/2019  Telephone Visit Disclaimer This Medicare AWV was conducted by telephone due to national recommendations for restrictions regarding the COVID-19 Pandemic (e.g. social distancing).  I verified, using two identifiers, that I am speaking with Brent Snow or their authorized healthcare agent. I discussed the limitations, risks, security, and privacy concerns of performing an evaluation and management service by telephone and the potential availability of an in-person appointment in the future. The patient expressed understanding and agreed to proceed.   Subjective:  Brent Snow is a 78 y.o. male patient of Janora Norlander, DO who had a Medicare Annual Wellness Visit today via telephone. Brent Snow is retired and lives with his wife in [place], [place]. He has 2 children and 1 grandchild. He reports that he is socially active and does interact with friends/family regularly. He is moderately physically active and enjoys gardening, yard work, and traveling.  Patient Care Team: Janora Norlander, DO as PCP - General (Family Medicine)  Advanced Directives 05/07/2019 01/27/2017 10/20/2015 03/14/2015 11/08/2014  Does Patient Have a Medical Advance Directive? No No No No Yes  Type of Advance Directive - - - - Press photographer;Living will  Does patient want to make changes to medical advance directive? - - - - No - Patient declined  Copy of [place] in Chart? - - - - No - copy requested  Would patient like information on creating a medical advance directive? Yes (MAU/Ambulatory/Procedural Areas - Information given) Yes (MAU/Ambulatory/Procedural Areas - Information given) No - patient declined information - [facility] Utilization Over the Past 12 Months: # of hospitalizations or ER visits: 0 # of surgeries: 0  Review of Systems    Patient reports that his overall health is better compared to last year because he  feels his breathing has improved and energy level is increased.    Review of Systems:   All systems negative as reported by patient.  Pain Assessment Pain Score: 0-No pain     Current Medications & Allergies (verified) Allergies as of 05/07/2019   No Known Allergies     Medication List       Accurate as of May 07, 2019 11:04 AM. If you have any questions, ask your nurse or doctor.        albuterol 108 (90 Base) MCG/ACT inhaler Commonly known as:  ProAir HFA Inhale 2 puffs into the lungs every 4 (four) hours as needed.   Dulaglutide 1.5 MG/0.5ML Sopn Commonly known as:  Trulicity INJECT 0.3TW (=1.5 MG)     SUBCUTANEOUSLY ONCE WEEKLY   glucose blood test strip Check BS QD and PRN   HYDROcodone-acetaminophen 5-325 MG tablet Commonly known as:  NORCO/VICODIN Take 1 tablet by mouth 2 (two) times daily as needed for moderate pain.   Ibuprofen 200 MG Caps Take 2 capsules by mouth as needed.   lisinopril 20 MG tablet Commonly known as:  ZESTRIL Take 1 tablet (20 mg total) by mouth daily.   metFORMIN 500 MG tablet Commonly known as:  GLUCOPHAGE Take 2 tablets (1,000 mg total) by mouth 2 (two) times daily with a meal. What changed:  when to take this   onetouch ultrasoft lancets   pravastatin 40 MG tablet Commonly known as:  PRAVACHOL Take 1 tablet (40 mg total) by mouth daily.   sildenafil 20 MG tablet Commonly known as:  REVATIO TAKE 2 TABLETS BY MOUTH AS NEEDED   Trelegy Ellipta  100-62.5-25 MCG/INH Aepb Generic drug:  Fluticasone-Umeclidin-Vilant USE 1 INHALATION ORALLY    DAILY       History (reviewed): Past Medical History:  Diagnosis Date   Arthritis    COPD, severe ([place])    pulmologist-  dr byrum   Dyspnea on exertion    prescribed to use O2 via [place]--- per pt he does not use O2 he just take 1-2 puffs of rescue inhaler   Hiatal hernia    History of acute respiratory failure    02-18-2007  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    Hypertension    Hypoxemic respiratory failure, chronic (HCC)    pulmologist-  dr byrum-- prescribed supplemental O2 @ 2L via [place]    OSA on CPAP    and added O2 @ 2L via  w/ cpap   Phimosis    S/P balloon dilatation of esophageal stricture    multiple    Type 2 diabetes mellitus ([place])    followed by pcp--  per note uncontrolled w/ last A1c >14 on 12-30-2016   Wears contact lenses    Past Surgical History:  Procedure Laterality Date   BACK SURGERY     CIRCUMCISION N/A 01/27/2017   Procedure: CIRCUMCISION ADULT;  Surgeon: Franchot Gallo, MD;  Location: [facility];  Service: Urology;  Laterality: N/A;   KNEE ARTHROSCOPY Right 2008   LUMBAR SPINE SURGERY  1977  approx.   TONSILLECTOMY AND ADENOIDECTOMY  age 89   TRANSTHORACIC ECHOCARDIOGRAM  09/12/2015   mild LVH,  ef 65-70%,  grade 1 diastolic dysfunction/  mild RAE   Family History  Problem Relation Age of Onset   Asthma Mother    COPD Mother    Cancer Maternal Grandmother        stomach   Early death Father 20       Tractor Accident   Social History   Socioeconomic History   Marital status: Married    Spouse name: Not on file   Number of children: 2   Years of education: 15   Highest education level: Some college, no degree  Occupational History   Occupation: Retired    Comment: Physiological scientist strain: Not hard at [employer] insecurity:    Worry: Never true    Inability: Never true   Transportation needs:    Medical: No    Non-medical: No  Tobacco Use   Smoking status: Former Smoker    Packs/day: 2.00    Years: 40.00    Pack years: 80.00    Types: Cigarettes    Last attempt to quit: 12/06/2006    Years since quitting: 12.4   Smokeless tobacco: Never Used  Substance and Sexual Activity   Alcohol use: Yes    Alcohol/week: 0.0 standard drinks    Comment: VERY LITTLE   Drug use: No   Sexual activity: Not on  file  Lifestyle   Physical activity:    Days per week: 5 days    Minutes per session: 60 min   Stress: Not at all  Relationships   Social connections:    Talks on phone: More than three times a week    Gets together: More than three times a week    Attends religious service: Never    Active member of club or organization: No    Attends meetings of clubs or organizations: Never    Relationship status: Married  Other Topics Concern   Not on file  Social History Narrative   Not on file    Activities of Daily Living In your present state of health, do you have any difficulty performing the following activities: 05/07/2019  Hearing? N  Vision? N  Difficulty concentrating or making decisions? N  Walking or climbing stairs? N  Dressing or bathing? N  Doing errands, shopping? N  Preparing Food and eating ? N  Using the Toilet? N  In the past six months, have you accidently leaked urine? N  Do you have problems with loss of bowel control? N  Managing your Medications? N  Managing your Finances? N  Housekeeping or managing your Housekeeping? N  Some recent data might be hidden        Exercise Current Exercise Habits: Home exercise routine, Type of exercise: walking, Time (Minutes): 60, Frequency (Times/Week): 5, Weekly Exercise (Minutes/Week): 300, Intensity: Moderate, Exercise limited by: orthopedic condition(s)  Diet Patient reports consuming 3 meals a day and 1 snack(s) a day Patient reports that his primary diet is: Diabetic Patient reports that she does have regular access to food.   Depression Screen PHQ 2/9 Scores 05/07/2019 05/07/2019 05/03/2019 03/05/2019 11/08/2018 05/03/2018 10/31/2017  PHQ - 2 Score 0 0 0 0 0 0 0  PHQ- 9 Score - - 0 0 - - -     Fall Risk Fall Risk  05/07/2019 05/03/2019 03/05/2019 11/08/2018 05/03/2018  Falls in the past year? 0 0 0 0 No  Follow up Falls prevention discussed - - - -     Objective:  Brent Snow seemed alert and oriented and  he participated appropriately during our telephone visit.  Blood Pressure Weight BMI  BP Readings from Last 3 Encounters:  05/03/19 115/66  03/05/19 138/83  11/08/18 130/85   Wt Readings from Last 3 Encounters:  05/03/19 212 lb (96.2 kg)  03/05/19 218 lb (98.9 kg)  01/30/19 227 lb (103 kg)   BMI Readings from Last 1 Encounters:  05/03/19 35.28 kg/m    *Unable to obtain current vital signs, weight, and BMI due to telephone visit type  Hearing/Vision   Brent Snow did not seem to have difficulty with hearing/understanding during the telephone conversation  Reports that he has had a formal eye exam by an eye care professional within the past year  Reports that he has not had a formal hearing evaluation within the past year *Unable to fully assess hearing and vision during telephone visit type  Cognitive Function: 6CIT Screen 05/07/2019  What Year? 0 points  What month? 0 points  What time? 0 points  Count back from 20 0 points  Months in reverse 0 points  Repeat phrase 2 points  Total Score 2    Normal Cognitive Function Screening: Yes (Normal:0-7, Significant for Dysfunction: >8)  Immunization & Health Maintenance Record Immunization History  Administered Date(s) Administered   Influenza, High Dose Seasonal PF 10/18/2018   Pneumococcal Conjugate-13 10/24/2013   Pneumococcal Polysaccharide-23 12/06/2006, 10/11/2012, 11/03/2016   Td 05/17/2006      Health Maintenance  Topic Date Due   OPHTHALMOLOGY EXAM  02/24/2018   TETANUS/TDAP  11/09/2019 (Originally 05/17/2016)   INFLUENZA VACCINE  07/07/2019   HEMOGLOBIN A1C  11/03/2019   FOOT EXAM  11/09/2019   PNA vac Low Risk Adult  Completed   Patient states he had eye exam in 02/2019, will call Dr. Gus Puma office to request results.  Recommended Tdap and shinrix vaccines in the future.  Assessment  This is a routine wellness examination for Brent Snow.  Health Maintenance: Due or Overdue Health  Maintenance Due  Topic Date Due   OPHTHALMOLOGY EXAM  02/24/2018     Brent Snow does not need a referral for Community Assistance: Care Management:   no Social Work:    no Prescription Assistance:  no Nutrition/Diabetes Education:  no   Plan:  Personalized Goals Goals Addressed            This Visit's Progress    Patient Stated (pt-stated)       Maintain healthy diet and exercise regimen.      Personalized Health Maintenance & Screening Recommendations  Td vaccine Advanced directives: has NO advanced directive  - add't info requested. Referral to SW: no  Lung Cancer Screening Recommended: yes (Low Dose CT Chest recommended if Age 48-80 years, 30 pack-year currently smoking OR have quit w/in past 15 years) Hepatitis C Screening recommended: N/A   Advanced Directives: Written information was prepared per patient's request.  Mailed to patient.  Referrals & Orders N/A  Follow-up Plan  Follow-up with Janora Norlander, DO as planned  Continue monitoring carbohydrate itnake and staying activite    I have personally reviewed and noted the following in the patients chart:    Medical and social history  Use of alcohol, tobacco or illicit drugs   Current medications and supplements  Functional ability and status  Nutritional status  Physical activity  Advanced directives  List of other physicians  Hospitalizations, surgeries, and ER visits in previous 12 months  Vitals  Screenings to include cognitive, depression, and falls  Referrals and appointments  In addition, I have reviewed and discussed with Brent Snow certain preventive protocols, quality metrics, and best practice recommendations. A written personalized care plan for preventive services as well as general preventive health recommendations is available and can be mailed to the patient at his request.      Marquelle Musgrave M  05/07/2019

## 2019-05-09 LAB — HM DIABETES EYE EXAM

## 2019-05-10 ENCOUNTER — Ambulatory Visit: Payer: Medicare Other | Admitting: Pediatrics

## 2019-05-11 ENCOUNTER — Ambulatory Visit: Payer: Medicare Other | Admitting: Family Medicine

## 2019-05-22 ENCOUNTER — Ambulatory Visit (INDEPENDENT_AMBULATORY_CARE_PROVIDER_SITE_OTHER): Payer: Medicare Other | Admitting: Urology

## 2019-05-22 DIAGNOSIS — N5201 Erectile dysfunction due to arterial insufficiency: Secondary | ICD-10-CM | POA: Diagnosis not present

## 2019-06-16 ENCOUNTER — Other Ambulatory Visit (INDEPENDENT_AMBULATORY_CARE_PROVIDER_SITE_OTHER): Payer: Medicare Other | Admitting: Family

## 2019-06-16 ENCOUNTER — Telehealth: Payer: Self-pay

## 2019-06-16 DIAGNOSIS — R51 Headache: Secondary | ICD-10-CM

## 2019-06-16 DIAGNOSIS — R519 Headache, unspecified: Secondary | ICD-10-CM

## 2019-06-16 DIAGNOSIS — R509 Fever, unspecified: Secondary | ICD-10-CM

## 2019-06-16 DIAGNOSIS — Z20822 Contact with and (suspected) exposure to covid-19: Secondary | ICD-10-CM

## 2019-06-16 NOTE — Telephone Encounter (Signed)
Pt scheduled for testing at [facility] 06/18/19 at 0830. Pt advised to stay in car and wear mask to testing site. Address provided.

## 2019-06-16 NOTE — Progress Notes (Signed)
   Virtual Visit via telephone Note  I connected with Brent Snow on 06/16/19 at 8:44 AM by telephone and verified that I am speaking with the correct person using two identifiers. Brent Snow is currently located at home and wife is currently with her during visit. The provider, Evelina Dun, FNP is located in their office at time of visit.  I discussed the limitations, risks, security and privacy concerns of performing an evaluation and management service by telephone and the availability of in person appointments. I also discussed with the patient that there may be a patient responsible charge related to this service. The patient expressed understanding and agreed to proceed.   History and Present Illness:  Fever  This is a new problem. The current episode started in the past 7 days (last two nights). The problem occurs intermittently. The problem has been unchanged. Associated symptoms include congestion, headaches, muscle aches and sleepiness. Pertinent negatives include no coughing, diarrhea, ear pain, nausea, sore throat (scratchy throat), urinary pain or vomiting. He has tried acetaminophen, NSAIDs and fluids for the symptoms. The treatment provided mild relief.      Review of Systems  Constitutional: Positive for fever.  HENT: Positive for congestion. Negative for ear pain and sore throat (scratchy throat).   Respiratory: Negative for cough.   Gastrointestinal: Negative for diarrhea, nausea and vomiting.  Genitourinary: Negative for dysuria.  Neurological: Positive for headaches.  All other systems reviewed and are negative.    Observations/Objective: No SOB or distress noted, slight congestion present  Assessment and Plan: 1. Fever, unspecified fever cause  2. Nonintractable headache, unspecified chronicity pattern, unspecified headache type  Will route for COVID testing Self isolate until test result return Tylenol as needed for fever and aches Force  fluids Pt stable at this time, but if symptoms worsen go to ED     I discussed the assessment and treatment plan with the patient. The patient was provided an opportunity to ask questions and all were answered. The patient agreed with the plan and demonstrated an understanding of the instructions.   The patient was advised to call back or seek an in-person evaluation if the symptoms worsen or if the condition fails to improve as anticipated.  The above assessment and management plan was discussed with the patient. The patient verbalized understanding of and has agreed to the management plan. Patient is aware to call the clinic if symptoms persist or worsen. Patient is aware when to return to the clinic for a follow-up visit. Patient educated on when it is appropriate to go to the emergency department.   Time call ended:  8:55 AM  I provided 11 minutes of non-face-to-face time during this encounter.    Evelina Dun, FNP

## 2019-06-18 ENCOUNTER — Other Ambulatory Visit: Payer: Medicare Other

## 2019-06-18 DIAGNOSIS — Z20822 Contact with and (suspected) exposure to covid-19: Secondary | ICD-10-CM

## 2019-06-18 DIAGNOSIS — R6889 Other general symptoms and signs: Secondary | ICD-10-CM | POA: Diagnosis not present

## 2019-06-21 LAB — NOVEL CORONAVIRUS, NAA: SARS-CoV-2, NAA: NOT DETECTED

## 2019-07-16 DIAGNOSIS — H2511 Age-related nuclear cataract, right eye: Secondary | ICD-10-CM | POA: Diagnosis not present

## 2019-07-16 DIAGNOSIS — H25811 Combined forms of age-related cataract, right eye: Secondary | ICD-10-CM | POA: Diagnosis not present

## 2019-07-30 ENCOUNTER — Other Ambulatory Visit: Payer: Self-pay | Admitting: Emergency Medicine

## 2019-07-30 DIAGNOSIS — H25812 Combined forms of age-related cataract, left eye: Secondary | ICD-10-CM | POA: Diagnosis not present

## 2019-07-30 DIAGNOSIS — H2512 Age-related nuclear cataract, left eye: Secondary | ICD-10-CM | POA: Diagnosis not present

## 2019-08-22 DIAGNOSIS — H02403 Unspecified ptosis of bilateral eyelids: Secondary | ICD-10-CM | POA: Diagnosis not present

## 2019-08-24 DIAGNOSIS — Z0289 Encounter for other administrative examinations: Secondary | ICD-10-CM

## 2019-08-27 ENCOUNTER — Telehealth: Payer: Self-pay | Admitting: Family Medicine

## 2019-08-27 NOTE — Telephone Encounter (Signed)
Completed.  Returned to [employer].

## 2019-09-03 DIAGNOSIS — H02834 Dermatochalasis of left upper eyelid: Secondary | ICD-10-CM | POA: Diagnosis not present

## 2019-09-03 DIAGNOSIS — H02403 Unspecified ptosis of bilateral eyelids: Secondary | ICD-10-CM | POA: Diagnosis not present

## 2019-09-03 DIAGNOSIS — H02401 Unspecified ptosis of right eyelid: Secondary | ICD-10-CM | POA: Diagnosis not present

## 2019-09-03 DIAGNOSIS — H02402 Unspecified ptosis of left eyelid: Secondary | ICD-10-CM | POA: Diagnosis not present

## 2019-09-03 DIAGNOSIS — H02831 Dermatochalasis of right upper eyelid: Secondary | ICD-10-CM | POA: Diagnosis not present

## 2019-09-26 DIAGNOSIS — I781 Nevus, non-neoplastic: Secondary | ICD-10-CM | POA: Diagnosis not present

## 2019-09-26 DIAGNOSIS — L821 Other seborrheic keratosis: Secondary | ICD-10-CM | POA: Diagnosis not present

## 2019-09-26 DIAGNOSIS — Z85820 Personal history of malignant melanoma of skin: Secondary | ICD-10-CM | POA: Diagnosis not present

## 2019-09-26 DIAGNOSIS — L570 Actinic keratosis: Secondary | ICD-10-CM | POA: Diagnosis not present

## 2019-09-26 DIAGNOSIS — Z85828 Personal history of other malignant neoplasm of skin: Secondary | ICD-10-CM | POA: Diagnosis not present

## 2019-10-25 ENCOUNTER — Ambulatory Visit (INDEPENDENT_AMBULATORY_CARE_PROVIDER_SITE_OTHER): Payer: Medicare Other

## 2019-10-25 ENCOUNTER — Other Ambulatory Visit: Payer: Self-pay

## 2019-10-25 DIAGNOSIS — Z23 Encounter for immunization: Secondary | ICD-10-CM | POA: Diagnosis not present

## 2019-11-06 ENCOUNTER — Ambulatory Visit (INDEPENDENT_AMBULATORY_CARE_PROVIDER_SITE_OTHER): Payer: Medicare Other | Admitting: Family Medicine

## 2019-11-06 ENCOUNTER — Encounter: Payer: Self-pay | Admitting: Family Medicine

## 2019-11-06 DIAGNOSIS — E1159 Type 2 diabetes mellitus with other circulatory complications: Secondary | ICD-10-CM

## 2019-11-06 DIAGNOSIS — I10 Essential (primary) hypertension: Secondary | ICD-10-CM | POA: Diagnosis not present

## 2019-11-06 DIAGNOSIS — E119 Type 2 diabetes mellitus without complications: Secondary | ICD-10-CM

## 2019-11-06 DIAGNOSIS — I152 Hypertension secondary to endocrine disorders: Secondary | ICD-10-CM

## 2019-11-06 MED ORDER — PRAVASTATIN SODIUM 40 MG PO TABS: 40.0000 mg | ORAL_TABLET | Freq: Every day | ORAL | 3 refills | Status: DC

## 2019-11-06 MED ORDER — LISINOPRIL 20 MG PO TABS: 20.0000 mg | ORAL_TABLET | Freq: Every day | ORAL | 3 refills | Status: DC

## 2019-11-06 MED ORDER — METFORMIN HCL 500 MG PO TABS: 500.0000 mg | ORAL_TABLET | Freq: Every day | ORAL | 1 refills | Status: DC

## 2019-11-06 MED ORDER — TRELEGY ELLIPTA 100-62.5-25 MCG/INH IN AEPB: 1.0000 | INHALATION_SPRAY | Freq: Every day | RESPIRATORY_TRACT | 0 refills | Status: DC

## 2019-11-06 NOTE — Progress Notes (Signed)
Telephone visit  Subjective: CC:DM f/u PCP: Janora Norlander, DO PP:[id number] Brent Snow is a 78 y.o. male calls for telephone consult today. Patient provides verbal consent for consult held via phone.  Location of patient: home Location of provider: [facility] Others present for call: none  1. Type 2 Diabetes:  Patient reports compliance with Trulicity.  He is using Metformin 500 mg daily.  He is compliant with lisinopril 20 mg daily and Pravachol 40 mg daily. Last eye exam: Up-to-date Last foot exam: Up-to-date Last A1c:  Lab Results  Component Value Date   HGBA1C 6.3 05/03/2019   Nephropathy screen indicated?:  On ACE inhibitor Last flu, zoster and/or pneumovax:  Immunization History  Administered Date(s) Administered  . Fluad Quad(high Dose 65+) 10/25/2019  . Influenza, High Dose Seasonal PF 10/18/2018  . Pneumococcal Conjugate-13 10/24/2013  . Pneumococcal Polysaccharide-23 12/06/2006, 10/11/2012, 11/03/2016  . Td 05/17/2006    ROS: No chest pain, shortness of breath, dizziness or falls.  ROS: Per HPI  No Known Allergies Past Medical History:  Diagnosis Date  . Arthritis   . COPD, severe ([place])    pulmologist-  dr byrum  . Dyspnea on exertion    prescribed to use O2 via [place]--- per pt he does not use O2 he just take 1-2 puffs of rescue inhaler  . Hiatal hernia   . 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 Hypertension   . Hypoxemic respiratory failure, chronic ([place])    pulmologist-  dr byrum-- prescribed supplemental O2 @ 2L via [place]   . OSA on CPAP    and added O2 @ 2L via [place] w/ cpap  . Phimosis   . S/P balloon dilatation of esophageal stricture    multiple   . Type 2 diabetes mellitus ([place])    followed by pcp--  per note uncontrolled w/ last A1c >14 on 12-30-2016  . Wears contact lenses     Current Outpatient Medications:  .  albuterol (PROAIR HFA) 108 (90 Base) MCG/ACT inhaler, Inhale 2 puffs into  the lungs every 4 (four) hours as needed., Disp: 3 Inhaler, Rfl: 0 .  Dulaglutide (TRULICITY) 1.5 0000000 SOPN, INJECT 0.5ML (=1.5 MG)     SUBCUTANEOUSLY ONCE WEEKLY, Disp: 6 mL, Rfl: 2 .  glucose blood test strip, Check BS QD and PRN, Disp: 100 each, Rfl: 5 .  HYDROcodone-acetaminophen (NORCO/VICODIN) 5-325 MG tablet, Take 1 tablet by mouth 2 (two) times daily as needed for moderate pain., Disp: , Rfl:  .  Ibuprofen 200 MG CAPS, Take 2 capsules by mouth as needed. , Disp: , Rfl:  .  Lancets (ONETOUCH ULTRASOFT) lancets, , Disp: , Rfl:  .  lisinopril (ZESTRIL) 20 MG tablet, Take 1 tablet (20 mg total) by mouth daily., Disp: 90 tablet, Rfl: 1 .  metFORMIN (GLUCOPHAGE) 500 MG tablet, Take 2 tablets (1,000 mg total) by mouth 2 (two) times daily with a meal. (Patient taking differently: Take 1,000 mg by mouth daily. ), Disp: 360 tablet, Rfl: 1 .  pravastatin (PRAVACHOL) 40 MG tablet, Take 1 tablet (40 mg total) by mouth daily., Disp: 90 tablet, Rfl: 1 .  sildenafil (REVATIO) 20 MG tablet, TAKE 2 TABLETS BY MOUTH AS NEEDED, Disp: , Rfl: 1 .  TRELEGY ELLIPTA 100-62.5-25 MCG/INH AEPB, USE 1 INHALATION ORALLY    DAILY, Disp: 180 each, Rfl: 0  Assessment/ Plan: 78 y.o. male   1. Type 2 diabetes mellitus without  complication, without long-term current use of insulin ([place]) Has self reduced to 500mg .  Will check A1c.  Continue Trulicity.  We discussed may need to increase to twice daily dosing of the Metformin if A1c is greater than 7.  He was good understanding.  Pravachol also sent.  Lipid panel up-to-date - metFORMIN (GLUCOPHAGE) 500 MG tablet; Take 1 tablet (500 mg total) by mouth daily with breakfast.  Dispense: 90 tablet; Refill: 1 - pravastatin (PRAVACHOL) 40 MG tablet; Take 1 tablet (40 mg total) by mouth daily.  Dispense: 90 tablet; Refill: 3  2. Hypertension associated with diabetes ([place]) Continue lisinopril for now.  We discussed that his potassium was a little on the high side last visit.   Uncertain etiology his renal function appears to be normal.  May need to consider reducing the lisinopril versus adding hydrochlorothiazide.  He is asymptomatic with regards to potassium. - lisinopril (ZESTRIL) 20 MG tablet; Take 1 tablet (20 mg total) by mouth daily.  Dispense: 90 tablet; Refill: 3   Start time: 8:00am End time: 8:10am  Total time spent on patient care (including telephone call/ virtual visit): 15 minutes  [place], [place] [phone]

## 2019-11-12 ENCOUNTER — Other Ambulatory Visit: Payer: Self-pay | Admitting: Emergency Medicine

## 2019-12-18 DIAGNOSIS — Z23 Encounter for immunization: Secondary | ICD-10-CM | POA: Diagnosis not present

## 2020-01-07 DIAGNOSIS — L570 Actinic keratosis: Secondary | ICD-10-CM | POA: Diagnosis not present

## 2020-01-07 DIAGNOSIS — Z85828 Personal history of other malignant neoplasm of skin: Secondary | ICD-10-CM | POA: Diagnosis not present

## 2020-01-07 DIAGNOSIS — L821 Other seborrheic keratosis: Secondary | ICD-10-CM | POA: Diagnosis not present

## 2020-01-07 DIAGNOSIS — Z85820 Personal history of malignant melanoma of skin: Secondary | ICD-10-CM | POA: Diagnosis not present

## 2020-01-07 DIAGNOSIS — I781 Nevus, non-neoplastic: Secondary | ICD-10-CM | POA: Diagnosis not present

## 2020-01-07 DIAGNOSIS — D485 Neoplasm of uncertain behavior of skin: Secondary | ICD-10-CM | POA: Diagnosis not present

## 2020-01-16 ENCOUNTER — Telehealth: Payer: Self-pay | Admitting: Family Medicine

## 2020-01-16 NOTE — Chronic Care Management (AMB) (Signed)
  Chronic Care Management   Outreach Note  01/16/2020 Name: Brent Snow MRN: [id number] DOB: March 20, 1941  Brent Snow is a 79 y.o. year old male who is a primary care patient of Janora Norlander, DO. I reached out to Brent Snow by phone today in response to a referral sent by Mr. Brent Snow's health plan.     An unsuccessful telephone outreach was attempted today. The patient was referred to the case management team for assistance with care management and care coordination.   Follow Up Plan: A HIPPA compliant phone message was left for the patient providing contact information and requesting a return call.  The care management team will reach out to the patient again over the next 7 days.  If patient returns call to provider office, please advise to call [facility] at [place], [place], [place],  [address] Direct Dial: [phone] [email or web address]: [email or web address]

## 2020-01-18 DIAGNOSIS — Z23 Encounter for immunization: Secondary | ICD-10-CM | POA: Diagnosis not present

## 2020-01-21 NOTE — Chronic Care Management (AMB) (Signed)
  Chronic Care Management   Outreach Note  01/21/2020 Name: Brent Snow MRN: [id number] DOB: 1941/09/09  Brent Snow is a 79 y.o. year old male who is a primary care patient of Janora Norlander, DO. I reached out to Brent Snow by phone today in response to a referral sent by Brent Snow's health plan.     A second unsuccessful telephone outreach was attempted today. The patient was referred to the case management team for assistance with care management and care coordination.   Follow Up Plan: A HIPPA compliant phone message was left for the patient providing contact information and requesting a return call.  The care management team will reach out to the patient again over the next 7 days.  If patient returns call to provider office, please advise to call [facility] at [place], [place], [place], [place] [address] Direct Dial: [phone] [email or web address] Website: [email or web address]

## 2020-01-23 NOTE — Chronic Care Management (AMB) (Signed)
  Chronic Care Management   Outreach Note  01/23/2020 Name: DAMETRIUS HAGOPIAN MRN: [id number] DOB: 08-18-1941  GRIGOR CANGE is a 79 y.o. year old male who is a primary care patient of Janora Norlander, DO. I reached out to Alfonzo Beers by phone today in response to a referral sent by Mr. Dreyton Sesler Groom's health plan.     Third unsuccessful telephone outreach was attempted today. The patient was referred to the case management team for assistance with care management and care coordination. The patient's primary care provider has been notified of our unsuccessful attempts to make or maintain contact with the patient. The care management team is pleased to engage with this patient at any time in the future should he/she be interested in assistance from the care management team.   Follow Up Plan: A HIPPA compliant phone message was left for the patient providing contact information and requesting a return call.  The care management team is available to follow up with the patient after provider conversation with the patient regarding recommendation for care management engagement and subsequent re-referral to the care management team.   Noreene Larsson, [place], [place],  [address] Direct Dial: [phone] [email or web address] Website: [email or web address]

## 2020-02-21 ENCOUNTER — Other Ambulatory Visit: Payer: Self-pay | Admitting: Family Medicine

## 2020-02-21 DIAGNOSIS — E119 Type 2 diabetes mellitus without complications: Secondary | ICD-10-CM

## 2020-04-07 ENCOUNTER — Telehealth: Payer: Self-pay | Admitting: Emergency Medicine

## 2020-04-07 DIAGNOSIS — G4733 Obstructive sleep apnea (adult) (pediatric): Secondary | ICD-10-CM

## 2020-04-07 NOTE — Telephone Encounter (Signed)
Pt is requesting a new cpap machine.  States he has had his current machine X8-9 years, is "worn out".  Pt currently uses [facility], would like to switch to [facility] if possible d/t closer proximity to home.    Pt has upcoming appt on 05/02/20.    RB please advise if ok to order.  Thanks!

## 2020-04-08 NOTE — Telephone Encounter (Signed)
OK to order, but [facility] is going to want a copy of his PSG, and we will need to know his existing settings so we can make the order to [facility]. Alternatively we could order auto-set, 5-20cmH2O

## 2020-04-08 NOTE — Telephone Encounter (Signed)
Left message for patient to call back  

## 2020-04-09 NOTE — Telephone Encounter (Signed)
LMTCB x2 for pt 

## 2020-04-10 ENCOUNTER — Other Ambulatory Visit: Payer: Self-pay | Admitting: *Deleted

## 2020-04-10 DIAGNOSIS — G4733 Obstructive sleep apnea (adult) (pediatric): Secondary | ICD-10-CM

## 2020-04-10 NOTE — Telephone Encounter (Signed)
Spoke with Brent Snow. He is aware of Dr. Agustina Caroli response. Order has been placed. Brent Snow states that his pressure is currently set at 4cm. Nothing further needed.

## 2020-04-22 ENCOUNTER — Telehealth: Payer: Self-pay | Admitting: Emergency Medicine

## 2020-04-22 NOTE — Telephone Encounter (Signed)
Pt is scheduled for appt with Dr. Lamonte Sakai 5/28.  Called and spoke with Bethanne Ginger letting her know of pt's upcoming appt. She is requesting [place] to send them another message after pt has the visit so they can pull those OV notes. Routing this to [facility] pool as an  Pharmacist, hospital.

## 2020-04-22 NOTE — Telephone Encounter (Signed)
Put a note in appt notes to send copy of ov notes to [facility]

## 2020-05-02 ENCOUNTER — Ambulatory Visit (INDEPENDENT_AMBULATORY_CARE_PROVIDER_SITE_OTHER): Payer: Medicare Other | Admitting: Emergency Medicine

## 2020-05-02 ENCOUNTER — Encounter: Payer: Self-pay | Admitting: Emergency Medicine

## 2020-05-02 ENCOUNTER — Other Ambulatory Visit: Payer: Self-pay

## 2020-05-02 DIAGNOSIS — J449 Chronic obstructive pulmonary disease, unspecified: Secondary | ICD-10-CM | POA: Diagnosis not present

## 2020-05-02 DIAGNOSIS — G4733 Obstructive sleep apnea (adult) (pediatric): Secondary | ICD-10-CM

## 2020-05-02 NOTE — Progress Notes (Signed)
Subjective:    Patient ID: Brent Snow, male    DOB: Apr 10, 1941, 79 y.o.   MRN: [id number]  COPD He complains of shortness of breath. There is no cough or wheezing. Pertinent negatives include no ear pain, fever, headaches, postnasal drip, rhinorrhea, sneezing, sore throat or trouble swallowing. His past medical history is significant for COPD.   PMH:  severe COPD, exertional hypoxemia, obstructive sleep apnea, diabetes mellitus. He has chronic exertional hypoxemia, uses oxygen as needed.   ROV 05/02/20 --follow-up visit for 79 year old man with severe COPD, OSA on CPAP and associated chronic hypoxemic respiratory failure.  Most recent information that [facility] has is from 2013.  He needs a new machine > broke months ago and hasn't been able to wear for over 4 months. Poorer sleep, more daytime sleepiness, less energy. Takes naps.   Currently managed on Trelegy, has albuterol which he uses very rarely - once in 6 months. He is fairly active, able to exert, walk the dog. He has lost about 40 lbs (intentional)                                                              Review of Systems  Constitutional: Negative for fever and unexpected weight change.  HENT: Negative for congestion, dental problem, ear pain, nosebleeds, postnasal drip, rhinorrhea, sinus pressure, sneezing, sore throat and trouble swallowing.   Eyes: Negative for redness and itching.  Respiratory: Positive for shortness of breath. Negative for cough, chest tightness and wheezing.   Cardiovascular: Negative for palpitations and leg swelling.  Gastrointestinal: Negative for nausea and vomiting.  Genitourinary: Negative for dysuria.  Musculoskeletal: Negative for joint swelling.       Pain and stiffness  Skin: Negative for rash.  Neurological: Negative for headaches.  Hematological: Does not bruise/bleed easily.  Psychiatric/Behavioral: Negative for dysphoric mood and suicidal ideas. The patient is not nervous/anxious.        Objective:   Physical Exam Vitals:   05/02/20 1447  BP: 108/62  Pulse: 80  Temp: 98.1 F (36.7 C)  TempSrc: Oral  SpO2: 92%  Weight: 202 lb 3.2 oz (91.7 kg)  Height: 5\' 5"  (1.651 m)   Gen: Pleasant, overweight, in no distress,  normal affect  ENT: No lesions,  mouth clear,  oropharynx clear, no postnasal drip  Neck: No JVD, no stridor  Lungs: No use of accessory muscles, distant, no wheezing  Cardiovascular: RRR, heart sounds normal, no murmur or gallops, no peripheral edema  Musculoskeletal: No deformities, no cyanosis or clubbing  Neuro: alert, non focal  Skin: Warm, no lesions or rashes     Assessment & Plan:  COPD (chronic obstructive pulmonary disease) Please continue Trelegy 1 inhalation once daily as you have been taking it.  Rinse and gargle after using. Keep albuterol available so that you can use 2 puffs if needed for shortness of breath, chest tightness, weakness. COVID 19 vaccine up to date Follow with Dr Lamonte Sakai in 3 months or sooner if you have any problems.  Obstructive sleep apnea His machine broke months ago, he has not used it for several months.  Apparently there is an order pending through [place].  He has lost significant weight and his optimal pressures are not clear to me.  I think he needs repeat  split-night sleep study both to obtain the new machine and also titrate his pressures accordingly.  We will arrange for this and I will follow up with him in 3 months.  Baltazar Apo, MD, PhD 05/02/2020, 3:04 PM [facility] [phone] or if no answer [phone]

## 2020-05-02 NOTE — Assessment & Plan Note (Signed)
His machine broke months ago, he has not used it for several months.  Apparently there is an order pending through [place].  He has lost significant weight and his optimal pressures are not clear to me.  I think he needs repeat split-night sleep study both to obtain the new machine and also titrate his pressures accordingly.  We will arrange for this and I will follow up with him in 3 months.

## 2020-05-02 NOTE — Patient Instructions (Signed)
Please continue Trelegy 1 inhalation once daily as you have been taking it.  Rinse and gargle after using. Keep albuterol available so that you can use 2 puffs if needed for shortness of breath, chest tightness, weakness. Continue your oxygen as you have been using it COVID 19 vaccine up to date We will perform a split-night sleep study.  This will allow [place] to titrate your CPAP to the appropriate pressure.  We will continue to work on getting you a new machine through [place] Follow with Dr Lamonte Sakai in 3 months or sooner if you have any problems.

## 2020-05-02 NOTE — Addendum Note (Signed)
Addended by: Gavin Potters R on: 05/02/2020 03:36 PM   Modules accepted: Orders

## 2020-05-02 NOTE — Assessment & Plan Note (Signed)
Please continue Trelegy 1 inhalation once daily as you have been taking it.  Rinse and gargle after using. Keep albuterol available so that you can use 2 puffs if needed for shortness of breath, chest tightness, weakness. COVID 19 vaccine up to date Follow with Dr Lamonte Sakai in 3 months or sooner if you have any problems.

## 2020-05-10 ENCOUNTER — Other Ambulatory Visit: Payer: Self-pay | Admitting: Emergency Medicine

## 2020-05-10 ENCOUNTER — Other Ambulatory Visit: Payer: Self-pay | Admitting: Family Medicine

## 2020-05-10 DIAGNOSIS — E119 Type 2 diabetes mellitus without complications: Secondary | ICD-10-CM

## 2020-05-19 ENCOUNTER — Other Ambulatory Visit (HOSPITAL_COMMUNITY)
Admission: RE | Admit: 2020-05-19 | Discharge: 2020-05-19 | Disposition: A | Discharge status: Home / Self Care | Payer: Medicare Other | Source: Ambulatory Visit | Attending: Pulmonary Disease | Admitting: Pulmonary Disease

## 2020-05-19 DIAGNOSIS — Z20822 Contact with and (suspected) exposure to covid-19: Secondary | ICD-10-CM | POA: Diagnosis not present

## 2020-05-19 DIAGNOSIS — Z01812 Encounter for preprocedural laboratory examination: Secondary | ICD-10-CM | POA: Diagnosis present

## 2020-05-19 LAB — SARS CORONAVIRUS 2 (TAT 6-24 HRS): SARS Coronavirus 2: NEGATIVE

## 2020-05-21 ENCOUNTER — Other Ambulatory Visit: Payer: Self-pay

## 2020-05-21 ENCOUNTER — Ambulatory Visit (HOSPITAL_BASED_OUTPATIENT_CLINIC_OR_DEPARTMENT_OTHER): Payer: Medicare Other | Attending: Emergency Medicine | Admitting: Pulmonary Disease

## 2020-05-21 DIAGNOSIS — J449 Chronic obstructive pulmonary disease, unspecified: Secondary | ICD-10-CM | POA: Insufficient documentation

## 2020-05-21 DIAGNOSIS — Z6833 Body mass index (BMI) 33.0-33.9, adult: Secondary | ICD-10-CM | POA: Diagnosis not present

## 2020-05-21 DIAGNOSIS — G4733 Obstructive sleep apnea (adult) (pediatric): Secondary | ICD-10-CM | POA: Insufficient documentation

## 2020-05-21 DIAGNOSIS — E119 Type 2 diabetes mellitus without complications: Secondary | ICD-10-CM | POA: Diagnosis not present

## 2020-05-21 DIAGNOSIS — I10 Essential (primary) hypertension: Secondary | ICD-10-CM | POA: Diagnosis not present

## 2020-05-21 DIAGNOSIS — E669 Obesity, unspecified: Secondary | ICD-10-CM | POA: Insufficient documentation

## 2020-05-21 DIAGNOSIS — R0902 Hypoxemia: Secondary | ICD-10-CM | POA: Diagnosis not present

## 2020-05-23 DIAGNOSIS — G4733 Obstructive sleep apnea (adult) (pediatric): Secondary | ICD-10-CM | POA: Diagnosis not present

## 2020-05-23 NOTE — Procedures (Signed)
    Patient Name: Brent, [name] Date: 05/21/2020 Gender: Male D.O.B: 01/24/1941 Age (years): 79 Referring Provider: Baltazar Apo Height (inches): 35 Interpreting Physician: Chesley Mires MD, ABSM Weight (lbs): 200 RPSGT: Laren Everts BMI: 33 MRN: [id number] Neck Size: 15.00  CLINICAL INFORMATION Sleep Study Type: NPSG  Indication for sleep study: COPD, Diabetes, Fatigue, Hypertension, Obesity, OSA, Re-Evaluation, Sleep walking/talking/parasomnias, Snoring  Epworth Sleepiness Score: 17  SLEEP STUDY TECHNIQUE As per the AASM Manual for the Scoring of Sleep and Associated Events v2.3 (April 2016) with a hypopnea requiring 4% desaturations.  The channels recorded and monitored were frontal, central and occipital EEG, electrooculogram (EOG), submentalis EMG (chin), nasal and oral airflow, thoracic and abdominal wall motion, anterior tibialis EMG, snore microphone, electrocardiogram, and pulse oximetry.  MEDICATIONS Medications self-administered by patient taken the night of the study : N/A  SLEEP ARCHITECTURE The study was initiated at 9:46:06 PM and ended at 4:49:58 AM.  Sleep onset time was 18.0 minutes and the sleep efficiency was 75.5%%. The total sleep time was 320 minutes.  Stage REM latency was 96.0 minutes.  The patient spent 4.4%% of the night in stage N1 sleep, 75.2%% in stage N2 sleep, 0.0%% in stage N3 and 20.5% in REM.  Alpha intrusion was absent.  Supine sleep was 28.70%.  RESPIRATORY PARAMETERS The overall apnea/hypopnea index (AHI) was 5.8 per hour. There were 9 total apneas, including 9 obstructive, 0 central and 0 mixed apneas. There were 22 hypopneas and 22 RERAs.  The AHI during Stage REM sleep was 1.8 per hour.  AHI while supine was 10.5 per hour.  The mean oxygen saturation was 90.2%. The minimum SpO2 during sleep was 83.0%.  Had 1 liter supplemental oxygen applied during the study.  soft snoring was noted during this  study.  CARDIAC DATA The 2 lead EKG demonstrated sinus rhythm. The mean heart rate was 66.4 beats per minute. Other EKG findings include: PVCs.  LEG MOVEMENT DATA The total PLMS were 0 with a resulting PLMS index of 0.0. Associated arousal with leg movement index was 0.8 .  IMPRESSIONS - Mild obstructive sleep apnea with an AHI of 5.8. - SpO2 low was 83%.  He had 1 liter supplemental oxygen applied during the study.  DIAGNOSIS - Obstructive Sleep Apnea (327.23 [G47.33 ICD-10]) - Nocturnal Hypoxemia (327.26 [G47.36 ICD-10])  RECOMMENDATIONS - He should undergo an in lab CPAP titration study to determine if CPAP is sufficient to control sleep apnea and oxygenation at night, or if he would also need supplemental oxygen with PAP therapy.  [Electronically signed] 05/23/2020 05:29 PM  Chesley Mires MD, [place], American Board of Sleep Medicine   NPI: [id number]

## 2020-06-02 ENCOUNTER — Telehealth: Payer: Self-pay | Admitting: Emergency Medicine

## 2020-06-02 DIAGNOSIS — G4733 Obstructive sleep apnea (adult) (pediatric): Secondary | ICD-10-CM

## 2020-06-02 NOTE — Telephone Encounter (Signed)
Reviewed his PSG results >> shows mild sleep apnea associated with low O2 levels.   Dr Halford Chessman recommended an in-lab CPAP / oxygen titration to determine treatment. If the patient agrees then please order for him. Thanks.

## 2020-06-02 NOTE — Telephone Encounter (Signed)
Dr. Lamonte Sakai please advise on sleep study results.

## 2020-06-02 NOTE — Telephone Encounter (Signed)
Contacted patient and reviewed results from PSG study. Patient is willing to have a CPAP titration study. Ordered for next available.

## 2020-06-11 ENCOUNTER — Telehealth: Payer: Self-pay | Admitting: Family Medicine

## 2020-06-11 NOTE — Telephone Encounter (Signed)
Patient has a follow up appointment scheduled. 

## 2020-06-11 NOTE — Telephone Encounter (Signed)
  Prescription Request  06/11/2020  What is the name of the medication or equipment? Dulaglutide (TRULICITY) 1.5 GF/9.4VQ SOPN  Have you contacted your pharmacy to request a refill? (if applicable) no pt needs new rx for 3 mo supply. He pays the same for a one month supply  Which pharmacy would you like this sent to? [facility]   Patient notified that their request is being sent to the clinical staff for review and that they should receive a response within 2 business days.

## 2020-06-18 ENCOUNTER — Other Ambulatory Visit: Payer: Self-pay

## 2020-06-18 ENCOUNTER — Ambulatory Visit (HOSPITAL_BASED_OUTPATIENT_CLINIC_OR_DEPARTMENT_OTHER): Payer: Medicare Other | Attending: Pulmonary Disease | Admitting: Pulmonary Disease

## 2020-06-18 DIAGNOSIS — G4733 Obstructive sleep apnea (adult) (pediatric): Secondary | ICD-10-CM | POA: Diagnosis not present

## 2020-06-20 DIAGNOSIS — G4733 Obstructive sleep apnea (adult) (pediatric): Secondary | ICD-10-CM | POA: Diagnosis not present

## 2020-06-20 NOTE — Procedures (Signed)
    Patient Name: Brent Snow, Brent Snow Date: 06/18/2020 Gender: Male D.O.B: [birth year] Age (years): 75 Referring Provider: Baltazar Apo MD Height (inches): 65 Interpreting Physician: Chesley Mires MD, ABSM Weight (lbs): 199 RPSGT: Laren Everts BMI: 33 MRN: [id number] Neck Size: 15.00  CLINICAL INFORMATION The patient is referred for a CPAP titration to treat sleep apnea.  Date of NPSG 05/21/20: AHI 5.8, SpO2 low 83%.  SLEEP STUDY TECHNIQUE As per the AASM Manual for the Scoring of Sleep and Associated Events v2.3 (April 2016) with a hypopnea requiring 4% desaturations.  The channels recorded and monitored were frontal, central and occipital EEG, electrooculogram (EOG), submentalis EMG (chin), nasal and oral airflow, thoracic and abdominal wall motion, anterior tibialis EMG, snore microphone, electrocardiogram, and pulse oximetry. Continuous positive airway pressure (CPAP) was initiated at the beginning of the study and titrated to treat sleep-disordered breathing.  MEDICATIONS Medications self-administered by patient taken the night of the study : N/A  TECHNICIAN COMMENTS Comments added by technician: RBD observed during the night. Comments added by scorer: N/A  RESPIRATORY PARAMETERS Optimal PAP Pressure (cm): 8 AHI at Optimal Pressure (/hr): 0.8 Overall Minimal O2 (%): 84.0 Supine % at Optimal Pressure (%): 70 Minimal O2 at Optimal Pressure (%): 87.0   SLEEP ARCHITECTURE The study was initiated at 10:09:27 PM and ended at 4:47:35 AM.  Sleep onset time was 27.7 minutes and the sleep efficiency was 90.0%%. The total sleep time was 358.5 minutes.  The patient spent 2.8%% of the night in stage N1 sleep, 70.3%% in stage N2 sleep, 0.0%% in stage N3 and 26.9% in REM.Stage REM latency was 73.5 minutes  Wake after sleep onset was 11.9. Alpha intrusion was absent. Supine sleep was 45.82%.  CARDIAC DATA The 2 lead EKG demonstrated sinus rhythm. The mean heart rate was  55.7 beats per minute. Other EKG findings include: PVCs.  LEG MOVEMENT DATA The total Periodic Limb Movements of Sleep (PLMS) were 0. The PLMS index was 0.0. A PLMS index of <15 is considered normal in adults.  IMPRESSIONS - He did well with CPAP 8 cm H2O.  He was observed in REMA and supine sleep at this pressure setting. - He did not require supplemental oxygen during this study.  DIAGNOSIS - Obstructive Sleep Apnea (G47.33)  RECOMMENDATIONS - Trial of CPAP therapy on 8 cm H2O with a Medium size Fisher[employer]Paykel Full Face Mask Simplus mask and heated humidification.  [Electronically signed] 06/20/2020 08:33 AM  Chesley Mires MD, ABSM Diplomate, American Board of Sleep Medicine   NPI: [id number]

## 2020-06-30 ENCOUNTER — Ambulatory Visit (INDEPENDENT_AMBULATORY_CARE_PROVIDER_SITE_OTHER): Payer: Medicare Other | Admitting: Family Medicine

## 2020-06-30 ENCOUNTER — Other Ambulatory Visit: Payer: Self-pay

## 2020-06-30 ENCOUNTER — Encounter: Payer: Self-pay | Admitting: Family Medicine

## 2020-06-30 VITALS — BP 124/79 | HR 72 | Temp 97.3°F | Ht 65.0 in | Wt 200.0 lb

## 2020-06-30 DIAGNOSIS — E1169 Type 2 diabetes mellitus with other specified complication: Secondary | ICD-10-CM

## 2020-06-30 DIAGNOSIS — Z1159 Encounter for screening for other viral diseases: Secondary | ICD-10-CM | POA: Diagnosis not present

## 2020-06-30 DIAGNOSIS — D582 Other hemoglobinopathies: Secondary | ICD-10-CM

## 2020-06-30 DIAGNOSIS — I10 Essential (primary) hypertension: Secondary | ICD-10-CM | POA: Diagnosis not present

## 2020-06-30 DIAGNOSIS — E1159 Type 2 diabetes mellitus with other circulatory complications: Secondary | ICD-10-CM

## 2020-06-30 DIAGNOSIS — E785 Hyperlipidemia, unspecified: Secondary | ICD-10-CM | POA: Diagnosis not present

## 2020-06-30 LAB — BAYER DCA HB A1C WAIVED: HB A1C (BAYER DCA - WAIVED): 6.6 % (ref <7.0)

## 2020-06-30 MED ORDER — PRAVASTATIN SODIUM 40 MG PO TABS: 40.0000 mg | ORAL_TABLET | Freq: Every day | ORAL | 3 refills | Status: DC

## 2020-06-30 MED ORDER — TRULICITY 1.5 MG/0.5ML ~~LOC~~ SOAJ: 1.5000 mg | SUBCUTANEOUS | 3 refills | Status: DC

## 2020-06-30 MED ORDER — LISINOPRIL 20 MG PO TABS: 20.0000 mg | ORAL_TABLET | Freq: Every day | ORAL | 3 refills | Status: DC

## 2020-06-30 NOTE — Progress Notes (Signed)
Subjective: CC: f/u DM, HTN PCP: Janora Norlander, DO Brent Snow is a 79 y.o. male presenting to clinic today for:  1. Type 2 Diabetes w/ HTN and HLD:  Patient reports compliance with Trulicity 1.5 mg, metformin 574m qd, Pravachol and lisinopril.    Last [facility] exam: [facility]: [facility] Last A1c:  Lab Results  Component Value Date   HGBA1C 6.3 05/03/2019   Nephropathy screen indicated?: on ACE-I Last flu, zoster and/or pneumovax:  Immunization History  Administered Date(s) Administered   Fluad Quad(high Dose 65+) 10/25/2019   Influenza, High Dose Seasonal PF 10/18/2018   Moderna SARS-COVID-2 Vaccination 12/18/2019   PFIZER SARS-COV-2 Vaccination 02/15/2020   Pneumococcal Conjugate-13 10/24/2013   Pneumococcal Polysaccharide-23 12/06/2006, 10/11/2012, 11/03/2016   Td 05/17/2006    ROS: Denies polyruia, polydipsia, unintended weight loss/gain, foot ulcerations, numbness or tingling in extremities or chest pain.  2.  COPD Patient is followed by Dr. BWest Carboyearly.  He reports stability on Trelegy. This was refilled 05/2020 by pulmonology.  ROS: Per HPI  No Known Allergies Past Medical History:  Diagnosis Date   Arthritis    COPD, severe ([place]    pulmologist-  dr byrum   Dyspnea on exertion    prescribed to use O2 via [place]--- per pt he does not use O2 he just take 1-2 puffs of rescue inhaler   Hiatal hernia    History of acute respiratory failure    02-18-2007  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   Hypertension    Hypoxemic respiratory failure, chronic (HCC)    pulmologist-  dr byrum-- prescribed supplemental O2 @ 2L via [place]    OSA on CPAP    and added O2 @ 2L via [place] w/ cpap   Phimosis    S/P balloon dilatation of esophageal stricture    multiple    Type 2 diabetes mellitus ([place]    followed by pcp--  per note uncontrolled w/ last A1c >14 on 12-30-2016   Wears contact lenses     Current  Outpatient Medications:    albuterol (PROAIR HFA) 108 (90 Base) MCG/ACT inhaler, Inhale 2 puffs into the lungs every 4 (four) hours as needed., Disp: 3 Inhaler, Rfl: 0   Dulaglutide (TRULICITY) 1.5 MDB/5.2CESOPN, Inject 0.5 mLs (1.5 mg total) into the skin once a week. (Needs to be seen before next refill), Disp: 2 mL, Rfl: 0   glucose blood test strip, Check BS QD and PRN, Disp: 100 each, Rfl: 5   Ibuprofen 200 MG CAPS, Take 2 capsules by mouth as needed. , Disp: , Rfl:    Lancets (ONETOUCH ULTRASOFT) lancets, , Disp: , Rfl:    lisinopril (ZESTRIL) 20 MG tablet, Take 1 tablet (20 mg total) by mouth daily., Disp: 90 tablet, Rfl: 3   metFORMIN (GLUCOPHAGE) 500 MG tablet, Take 1 tablet (500 mg total) by mouth daily with breakfast., Disp: 90 tablet, Rfl: 1   pravastatin (PRAVACHOL) 40 MG tablet, Take 1 tablet (40 mg total) by mouth daily., Disp: 90 tablet, Rfl: 3   TRELEGY ELLIPTA 100-62.5-25 MCG/INH AEPB, USE 1 INHALATION ORALLY    DAILY, Disp: 180 each, Rfl: 3 Social History   Socioeconomic History   Marital status: Married    Spouse name: Not on file   Number of children: 2   Years of education: 15   Highest education level: Some college, no degree  Occupational History   Occupation:  Retired    Comment: Secondary school teacher  Tobacco Use   Smoking status: Former Smoker    Packs/day: 2.00    Years: 40.00    Pack years: 80.00    Types: Cigarettes    Quit date: 12/06/2006    Years since quitting: 13.5   Smokeless tobacco: Never Used  Vaping Use   Vaping Use: Never used  Substance and Sexual Activity   Alcohol use: Yes    Alcohol/week: 0.0 standard drinks    Comment: VERY LITTLE   Drug use: No   Sexual activity: Not on file  Other Topics Concern   Not on file  Social History Narrative   Not on file   Social Determinants of Health   Financial Resource Strain:    Difficulty of Paying Living Expenses:   Food Insecurity:    Worried About  Charity fundraiser in the Last Year:    Arboriculturist in the Last Year:   Transportation Needs:    Film/video editor (Medical):    Lack of Transportation (Non-Medical):   Physical Activity:    Days of Exercise per Week:    Minutes of Exercise per Session:   Stress:    Feeling of Stress :   Social Connections:    Frequency of Communication with Friends and Family:    Frequency of Social Gatherings with Friends and Family:    Attends Religious Services:    Active Member of Clubs or Organizations:    Attends Music therapist:    Marital Status:   Intimate Partner Violence:    Fear of Current or Ex-Partner:    Emotionally Abused:    Physically Abused:    Sexually Abused:    Family History  Problem Relation Age of Onset   Asthma Mother    COPD Mother    Cancer Maternal Grandmother        stomach   Early death Father 32       Tractor Accident    Objective: Office vital signs reviewed. BP 124/79    Pulse 72    Temp (!) 97.3 F (36.3 C) (Temporal)    Ht '5\' 5"'  (1.651 m)    Wt 200 lb (90.7 kg)    SpO2 92%    BMI 33.28 kg/m   Physical Examination:  General: Awake, alert, obese, No acute distress HEENT: Normal, sclera white, MMM Cardio: regular rate and rhythm, S1S2 heard, no murmurs appreciated Pulm: Globally decreased breath sounds.  No rhonchi, wheezes or rales.  Normal work of breathing on room air Extremities: warm, well perfused, No edema, cyanosis or clubbing; +2 pulses bilaterally MSK: antalgic, wide based gait and station Neuro: see DM foot Diabetic Foot Exam - Simple   Simple Foot Form Diabetic Foot exam was performed with the following findings: Yes 06/30/2020 11:12 AM  Visual Inspection No deformities, no ulcerations, no other skin breakdown bilaterally: Yes Sensation Testing Intact to touch and monofilament testing bilaterally: Yes Pulse Check Posterior Tibialis and Dorsalis pulse intact bilaterally: Yes Comments A few  callouses noted. No ulceration. Decreased vibratory sensation noted bilaterally.     Assessment/ Plan: 79 y.o. male   1. Type 2 diabetes mellitus with other specified complication, without long-term current use of insulin (HCC) Controlled. Sample of Trulicity provided to bridge until mail order arrives - [facility] DCA Hb A1c Waived  2. Hypertension associated with diabetes ([place]) Controlled  - CMP14+EGFR - lisinopril (ZESTRIL) 20 MG tablet; Take 1 tablet (20  mg total) by mouth daily.  Dispense: 90 tablet; Refill: 3  3. Hyperlipidemia associated with type 2 diabetes mellitus (HCC) Check lipid.  Continue statin - CMP14+EGFR - Lipid Panel - TSH  4. Abnormal hemoglobin (HCC) - CBC  5. Encounter for hepatitis C screening test for low risk patient - Hepatitis C antibody   No orders of the defined types were placed in this encounter.  Meds ordered this encounter  Medications   lisinopril (ZESTRIL) 20 MG tablet    Sig: Take 1 tablet (20 mg total) by mouth daily.    Dispense:  90 tablet    Refill:  3   pravastatin (PRAVACHOL) 40 MG tablet    Sig: Take 1 tablet (40 mg total) by mouth daily.    Dispense:  90 tablet    Refill:  3   Dulaglutide (TRULICITY) 1.5 EL/0.7AJ SOPN    Sig: Inject 0.5 mLs (1.5 mg total) into the skin once a week.    Dispense:  6 mL    Refill:  [place], [place] [phone]

## 2020-07-01 LAB — CMP14+EGFR
ALT: 18 IU/L (ref 0–44)
AST: 19 IU/L (ref 0–40)
Albumin/Globulin Ratio: 1.8 (ref 1.2–2.2)
Albumin: 4.3 g/dL (ref 3.7–4.7)
Alkaline Phosphatase: 79 IU/L (ref 48–121)
BUN/Creatinine Ratio: 21 (ref 10–24)
BUN: 19 mg/dL (ref 8–27)
Bilirubin Total: 0.4 mg/dL (ref 0.0–1.2)
CO2: 22 mmol/L (ref 20–29)
Calcium: 9.3 mg/dL (ref 8.6–10.2)
Chloride: 102 mmol/L (ref 96–106)
Creatinine, Ser: 0.91 mg/dL (ref 0.76–1.27)
GFR calc Af Amer: 94 mL/min/{1.73_m2} (ref >59)
GFR calc non Af Amer: 81 mL/min/{1.73_m2} (ref >59)
Globulin, Total: 2.4 g/dL (ref 1.5–4.5)
Glucose: 127 mg/dL — ABNORMAL HIGH (ref 65–99)
Potassium: 5.1 mmol/L (ref 3.5–5.2)
Sodium: 136 mmol/L (ref 134–144)
Total Protein: 6.7 g/dL (ref 6.0–8.5)

## 2020-07-01 LAB — CBC
Hematocrit: 51 % (ref 37.5–51.0)
Hemoglobin: 16.9 g/dL (ref 13.0–17.7)
MCH: 30.3 pg (ref 26.6–33.0)
MCHC: 33.1 g/dL (ref 31.5–35.7)
MCV: 91 fL (ref 79–97)
Platelets: 154 10*3/uL (ref 150–450)
RBC: 5.58 x10E6/uL (ref 4.14–5.80)
RDW: 13 % (ref 11.6–15.4)
WBC: 6.4 10*3/uL (ref 3.4–10.8)

## 2020-07-01 LAB — LIPID PANEL
Chol/HDL Ratio: 3.3 ratio (ref 0.0–5.0)
Cholesterol, Total: 148 mg/dL (ref 100–199)
HDL: 45 mg/dL (ref >39)
LDL Chol Calc (NIH): 82 mg/dL (ref 0–99)
Triglycerides: 118 mg/dL (ref 0–149)
VLDL Cholesterol Cal: 21 mg/dL (ref 5–40)

## 2020-07-01 LAB — HEPATITIS C ANTIBODY: Hep C Virus Ab: <0.1 s/co ratio (ref 0.0–0.9)

## 2020-07-01 LAB — TSH: TSH: 1.33 u[IU]/mL (ref 0.450–4.500)

## 2020-07-07 DIAGNOSIS — L821 Other seborrheic keratosis: Secondary | ICD-10-CM | POA: Diagnosis not present

## 2020-07-07 DIAGNOSIS — I781 Nevus, non-neoplastic: Secondary | ICD-10-CM | POA: Diagnosis not present

## 2020-07-07 DIAGNOSIS — L719 Rosacea, unspecified: Secondary | ICD-10-CM | POA: Diagnosis not present

## 2020-07-07 DIAGNOSIS — I788 Other diseases of capillaries: Secondary | ICD-10-CM | POA: Diagnosis not present

## 2020-07-07 DIAGNOSIS — D485 Neoplasm of uncertain behavior of skin: Secondary | ICD-10-CM | POA: Diagnosis not present

## 2020-07-07 DIAGNOSIS — L814 Other melanin hyperpigmentation: Secondary | ICD-10-CM | POA: Diagnosis not present

## 2020-07-07 DIAGNOSIS — L720 Epidermal cyst: Secondary | ICD-10-CM | POA: Diagnosis not present

## 2020-07-07 DIAGNOSIS — L570 Actinic keratosis: Secondary | ICD-10-CM | POA: Diagnosis not present

## 2020-07-07 DIAGNOSIS — L738 Other specified follicular disorders: Secondary | ICD-10-CM | POA: Diagnosis not present

## 2020-07-07 DIAGNOSIS — Z85820 Personal history of malignant melanoma of skin: Secondary | ICD-10-CM | POA: Diagnosis not present

## 2020-07-07 DIAGNOSIS — Z85828 Personal history of other malignant neoplasm of skin: Secondary | ICD-10-CM | POA: Diagnosis not present

## 2020-07-08 ENCOUNTER — Telehealth: Payer: Self-pay | Admitting: Emergency Medicine

## 2020-07-08 DIAGNOSIS — G4733 Obstructive sleep apnea (adult) (pediatric): Secondary | ICD-10-CM

## 2020-07-08 NOTE — Telephone Encounter (Signed)
Patient requesting results from CPAP titration study from 06/18/2020 and recommendations. Please advise.

## 2020-07-08 NOTE — Telephone Encounter (Signed)
Spoke with pt, aware of recs.  cpap ordered.  Scheduled rov per insurance guidelines.  Nothing further needed at this time- will close encounter.

## 2020-07-08 NOTE — Telephone Encounter (Signed)
His PSG recommended that he try CPAP 8cm H2O as below. Please go ahead and order for him if he agrees with doing so.   Trial of CPAP therapy on 8 cm H2O with a Medium size [employer] Full Face Mask Simplus mask (or best fit mask if there is another he prefers) and heated humidification.

## 2020-08-18 DIAGNOSIS — L089 Local infection of the skin and subcutaneous tissue, unspecified: Secondary | ICD-10-CM | POA: Diagnosis not present

## 2020-08-18 DIAGNOSIS — B351 Tinea unguium: Secondary | ICD-10-CM | POA: Diagnosis not present

## 2020-08-18 DIAGNOSIS — B353 Tinea pedis: Secondary | ICD-10-CM | POA: Diagnosis not present

## 2020-08-18 DIAGNOSIS — D485 Neoplasm of uncertain behavior of skin: Secondary | ICD-10-CM | POA: Diagnosis not present

## 2020-08-18 DIAGNOSIS — L309 Dermatitis, unspecified: Secondary | ICD-10-CM | POA: Diagnosis not present

## 2020-09-01 ENCOUNTER — Encounter: Payer: Self-pay | Admitting: Emergency Medicine

## 2020-09-01 ENCOUNTER — Other Ambulatory Visit: Payer: Self-pay

## 2020-09-01 ENCOUNTER — Ambulatory Visit (INDEPENDENT_AMBULATORY_CARE_PROVIDER_SITE_OTHER): Payer: Medicare Other | Admitting: Emergency Medicine

## 2020-09-01 VITALS — BP 134/72 | HR 84 | Temp 97.4°F | Ht 66.0 in | Wt 203.8 lb

## 2020-09-01 DIAGNOSIS — J449 Chronic obstructive pulmonary disease, unspecified: Secondary | ICD-10-CM

## 2020-09-01 DIAGNOSIS — Z23 Encounter for immunization: Secondary | ICD-10-CM

## 2020-09-01 DIAGNOSIS — J9611 Chronic respiratory failure with hypoxia: Secondary | ICD-10-CM | POA: Diagnosis not present

## 2020-09-01 NOTE — Patient Instructions (Signed)
Please continue to wear your CPAP every night as you have been doing. Continue Trelegy 1 inhalation once daily.  Rinse and gargle after you use it. Keep your albuterol available to use 2 puffs if needed for shortness of breath, chest tightness, wheezing.  We can refill this for you today if your inhaler has expired. You need to wear your oxygen with any exertion that is enough to drop your saturations less than 90%.  Our goal is to keep you greater than 90% COVID-19 vaccine is up-to-date. Flu shot today Follow with Dr Lamonte Sakai in 6 months or sooner if you have any problems

## 2020-09-01 NOTE — Progress Notes (Signed)
Subjective:    Patient ID: Brent Snow, male    DOB: February 17, 1941, 79 y.o.   MRN: [id number]  COPD He complains of shortness of breath. There is no cough or wheezing. Pertinent negatives include no ear pain, fever, headaches, postnasal drip, rhinorrhea, sneezing, sore throat or trouble swallowing. His past medical history is significant for COPD.   PMH:  severe COPD, exertional hypoxemia, obstructive sleep apnea, diabetes mellitus. He has chronic exertional hypoxemia, uses oxygen as needed.   ROV 05/02/20 --follow-up visit for 79 year old man with severe COPD, OSA on CPAP and associated chronic hypoxemic respiratory failure.  Most recent information that [facility] has is from 2013.  He needs a new machine > broke months ago and hasn't been able to wear for over 4 months. Poorer sleep, more daytime sleepiness, less energy. Takes naps.   Currently managed on Trelegy, has albuterol which he uses very rarely - once in 6 months. He is fairly active, able to exert, walk the dog. He has lost about 40 lbs (intentional)  ROV 09/01/20 --79 year old gentleman with severe COPD, OSA on CPAP and associated chronic hypoxemic respiratory failure.  We were just able to get him a new CPAP machine, set on 8 cmH2O.  He reports that he has finally gotten his new device - took a long time. He is wearing it reliably, has a full face mask. Tolerating well and wearing reliably. Sometimes gets up to urinate, put it back. He feels that he is sleeping better, better rested during the day.  Currently managed on Trelegy, use albuterol very rarely. He hasn't been flaring - has not had one for several years. He remains able to be active, to exert. He rarely uses his O2                                                              Review of Systems  Constitutional: Negative for fever and unexpected weight change.  HENT: Negative for congestion, dental problem, ear pain, nosebleeds, postnasal drip, rhinorrhea, sinus pressure,  sneezing, sore throat and trouble swallowing.   Eyes: Negative for redness and itching.  Respiratory: Positive for shortness of breath. Negative for cough, chest tightness and wheezing.   Cardiovascular: Negative for palpitations and leg swelling.  Gastrointestinal: Negative for nausea and vomiting.  Genitourinary: Negative for dysuria.  Musculoskeletal: Negative for joint swelling.       Pain and stiffness  Skin: Negative for rash.  Neurological: Negative for headaches.  Hematological: Does not bruise/bleed easily.  Psychiatric/Behavioral: Negative for dysphoric mood and suicidal ideas. The patient is not nervous/anxious.       Objective:   Physical Exam Vitals:   09/01/20 1350  BP: 134/72  Pulse: 84  Temp: (!) 97.4 F (36.3 C)  TempSrc: Temporal  SpO2: 92%  Weight: 203 lb 12.8 oz (92.4 kg)  Height: 5\' 6"  (1.676 m)   Gen: Pleasant, overweight, in no distress,  normal affect  ENT: No lesions,  mouth clear,  oropharynx clear, no postnasal drip  Neck: No JVD, no stridor  Lungs: No use of accessory muscles, distant, no wheezing  Cardiovascular: RRR, heart sounds normal, no murmur or gallops, no peripheral edema  Musculoskeletal: No deformities, no cyanosis or clubbing  Neuro: alert, non focal  Skin: Warm, no lesions or  rashes     Assessment & Plan:  COPD (chronic obstructive pulmonary disease) Doing very well ever since we started Trelegy.  No exacerbations for years.  Very rare albuterol use.  Good exertional tolerance.  He only rarely uses oxygen, has not seen desaturations.  Plan to continue same regimen.  COVID-19 vaccine up-to-date, discussed timing for booster.  He needs a flu shot today.    Chronic hypoxemic respiratory failure He has supplemental oxygen available, rarely uses it.  He checks his oxygen saturations and they stay greater than 90%.  We discussed saturation goal and he knows to wear his O2 if he has desaturations with exertion.  Baltazar Apo, MD,  PhD 09/01/2020, 2:15 PM [facility] [phone] or if no answer [phone]

## 2020-09-01 NOTE — Assessment & Plan Note (Signed)
He has supplemental oxygen available, rarely uses it.  He checks his oxygen saturations and they stay greater than 90%.  We discussed saturation goal and he knows to wear his O2 if he has desaturations with exertion.

## 2020-09-01 NOTE — Assessment & Plan Note (Signed)
Doing very well ever since we started Trelegy.  No exacerbations for years.  Very rare albuterol use.  Good exertional tolerance.  He only rarely uses oxygen, has not seen desaturations.  Plan to continue same regimen.  COVID-19 vaccine up-to-date, discussed timing for booster.  He needs a flu shot today.

## 2020-11-05 DIAGNOSIS — H35372 Puckering of macula, left eye: Secondary | ICD-10-CM | POA: Diagnosis not present

## 2020-12-18 DIAGNOSIS — Z23 Encounter for immunization: Secondary | ICD-10-CM | POA: Diagnosis not present

## 2021-01-14 ENCOUNTER — Ambulatory Visit (INDEPENDENT_AMBULATORY_CARE_PROVIDER_SITE_OTHER): Payer: Medicare Other

## 2021-01-14 ENCOUNTER — Other Ambulatory Visit: Payer: Self-pay

## 2021-01-14 ENCOUNTER — Ambulatory Visit
Admission: EM | Admit: 2021-01-14 | Discharge: 2021-01-14 | Disposition: A | Discharge status: Home / Self Care | Payer: Medicare Other | Attending: Family Medicine | Admitting: Family Medicine

## 2021-01-14 ENCOUNTER — Telehealth: Payer: Self-pay

## 2021-01-14 VITALS — BP 121/82 | HR 108 | Temp 98.2°F | Ht 65.0 in | Wt 200.0 lb

## 2021-01-14 DIAGNOSIS — R310 Gross hematuria: Secondary | ICD-10-CM | POA: Diagnosis not present

## 2021-01-14 DIAGNOSIS — Z978 Presence of other specified devices: Secondary | ICD-10-CM

## 2021-01-14 DIAGNOSIS — R339 Retention of urine, unspecified: Secondary | ICD-10-CM | POA: Diagnosis not present

## 2021-01-14 DIAGNOSIS — R319 Hematuria, unspecified: Secondary | ICD-10-CM

## 2021-01-14 LAB — URINALYSIS, ROUTINE W REFLEX MICROSCOPIC
Bilirubin, UA: NEGATIVE
Glucose, UA: NEGATIVE
Ketones, UA: NEGATIVE
Leukocytes,UA: NEGATIVE
Nitrite, UA: NEGATIVE
RBC, UA: NEGATIVE
Specific Gravity, UA: 1.015 (ref 1.005–1.030)
Urobilinogen, Ur: 0.2 mg/dL (ref 0.2–1.0)
pH, UA: 5.5 (ref 5.0–7.5)

## 2021-01-14 LAB — POCT URINALYSIS DIP (MANUAL ENTRY)
Glucose, UA: NEGATIVE mg/dL
Ketones, POC UA: NEGATIVE mg/dL
Leukocytes, UA: NEGATIVE
Nitrite, UA: NEGATIVE
Protein Ur, POC: 100 mg/dL — AB
Spec Grav, UA: >=1.03 — AB (ref 1.010–1.025)
Urobilinogen, UA: 0.2 E.U./dL
pH, UA: 5 (ref 5.0–8.0)

## 2021-01-14 LAB — MICROSCOPIC EXAMINATION
Bacteria, UA: NONE SEEN
Epithelial Cells (non renal): NONE SEEN /hpf (ref 0–10)
RBC, Urine: NONE SEEN /hpf (ref 0–2)
Renal Epithel, UA: NONE SEEN /hpf
WBC, UA: NONE SEEN /hpf (ref 0–5)

## 2021-01-14 LAB — BLADDER SCAN AMB NON-IMAGING: Scan Result: 708

## 2021-01-14 NOTE — Progress Notes (Signed)
Simple Catheter Placement  Due to urinary retention patient is present today for a foley cath placement.  Patient was cleaned and prepped in a sterile fashion with betadine. A 16 FR foley catheter was inserted, urine return was noted  931ml, urine was yellow in color.  The balloon was filled with 10cc of sterile water.  A leg bag was attached for drainage. Patient was also given a night bag to take home and was given instruction on how to change from one bag to another.  Patient was given instruction on proper catheter care.  Patient tolerated well, no complications were noted   Performed by: Antionette Char, Brionna Romanek,LPN

## 2021-01-14 NOTE — Telephone Encounter (Signed)
Pts wife called saying pt left office today and was not bleeding but later at home they went to empty bag and it was pure blood. Being it was so late I advised pt to call [facility] [address] and if they could not see pt to go to the ER.

## 2021-01-14 NOTE — ED Triage Notes (Signed)
Pt presents with c/o blood in urine. Pt had catheter placed this morning and then began having blood present shortly after. Pt has leg bag in in place, catheter is draining dark ref blood, emptied  aprx 500 ml from bag

## 2021-01-15 ENCOUNTER — Ambulatory Visit (INDEPENDENT_AMBULATORY_CARE_PROVIDER_SITE_OTHER): Payer: Medicare Other

## 2021-01-15 ENCOUNTER — Telehealth: Payer: Self-pay

## 2021-01-15 DIAGNOSIS — R339 Retention of urine, unspecified: Secondary | ICD-10-CM | POA: Diagnosis not present

## 2021-01-15 MED ORDER — TAMSULOSIN HCL 0.4 MG PO CAPS: 0.4000 mg | ORAL_CAPSULE | Freq: Every day | ORAL | 0 refills | Status: DC

## 2021-01-15 NOTE — Progress Notes (Signed)
Patient came into office today due to blood coming from catheter. Patient stated that last night urine bag was full of red blood. Upon assessment today catheter bag noted with dark colored urine with a red tint. Dr. Jeffie Pollock came in to talk with patient and his wife and explained that the bleeding was normal after the bladder had been distended. Patient started on flomax and will f/u with Dr. Diona Fanti on Tuesday.

## 2021-01-19 NOTE — Telephone Encounter (Signed)
It looks as if this has been taken care of.

## 2021-01-19 NOTE — Discharge Instructions (Signed)
You may follow up with urology in the morning  If you notice bright red frank blood, follow up with the ER before then

## 2021-01-19 NOTE — ED Provider Notes (Signed)
[facility]    CSN: [id number] Arrival date & time: 01/14/21  1839      History   Chief Complaint No chief complaint on file.   HPI Brent Snow is a 80 y.o. male.   Reports that he had a foley catheter placed this morning at a urology visit. States that when it was placed, there was clear yellow urine return. States that he has been having issues with urinary retention and that this is the reason for the catheter. Reports that this evening he noticed that the return from his catheter looked like blood. Denies pain, fever, rash, other symptoms.  ROS per HPI  The history is provided by the patient.    Past Medical History:  Diagnosis Date  . Arthritis   . COPD, severe ([place])    pulmologist-  dr byrum  . Dyspnea on exertion    prescribed to use O2 via [place]--- per pt he does not use O2 he just take 1-2 puffs of rescue inhaler  . Hiatal hernia   . 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 Hypertension   . Hypoxemic respiratory failure, chronic ([place])    pulmologist-  dr byrum-- prescribed supplemental O2 @ 2L via [place]   . OSA on CPAP    and added O2 @ 2L via [place] w/ cpap  . Phimosis   . S/P balloon dilatation of esophageal stricture    multiple   . Type 2 diabetes mellitus ([place])    followed by pcp--  per note uncontrolled w/ last A1c >14 on 12-30-2016  . Wears contact lenses     Patient Active Problem List   Diagnosis Date Noted  . Hyperlipidemia associated with type 2 diabetes mellitus ([place]) 05/03/2019  . Tinea pedis of both feet 11/27/2015  . Diabetes ([place]) 10/20/2015  . Chronic hypoxemic respiratory failure ([place]) 09/02/2015  . BMI 34.0-34.9,adult 09/06/2007  . Obstructive sleep apnea 09/06/2007  . Essential hypertension 09/06/2007  . COPD (chronic obstructive pulmonary disease) ([place]) 09/06/2007  . DEGENERATIVE JOINT DISEASE 09/06/2007  . Degenerative disc disease, lumbar 09/06/2007    Past  Surgical History:  Procedure Laterality Date  . BACK SURGERY    . CIRCUMCISION N/A 01/27/2017   Procedure: CIRCUMCISION ADULT;  Surgeon: Franchot Gallo, MD;  [facility];  Service: Urology;  Laterality: N/A;  . KNEE ARTHROSCOPY Right 2008  . LUMBAR SPINE SURGERY  1977  approx.  . TONSILLECTOMY AND ADENOIDECTOMY  age 57  . TRANSTHORACIC ECHOCARDIOGRAM  09/12/2015   mild LVH,  ef 65-70%,  grade 1 diastolic dysfunction/  mild RAE       Home Medications    Prior to Admission medications   Medication Sig Start Date End Date Taking? Authorizing Provider  albuterol (PROAIR HFA) 108 (90 Base) MCG/ACT inhaler Inhale 2 puffs into the lungs every 4 (four) hours as needed. 01/30/19   Collene Gobble, MD  Dulaglutide (TRULICITY) 1.5 WU/9.8JX SOPN Inject 0.5 mLs (1.5 mg total) into the skin once a week. 06/30/20   Ronnie Doss M, DO  glucose blood test strip Check BS QD and PRN 06/27/17   Eustaquio Maize, MD  Ibuprofen 200 MG CAPS Take 2 capsules by mouth as needed.     [provider]  Lancets Glory Rosebush ULTRASOFT) lancets  04/12/17   [provider]  lisinopril (ZESTRIL) 20 MG tablet Take 1 tablet (20 mg total) by  mouth daily. 06/30/20   Janora Norlander, DO  metFORMIN (GLUCOPHAGE) 500 MG tablet Take 1 tablet (500 mg total) by mouth daily with breakfast. 11/06/19   Ronnie Doss M, DO  pravastatin (PRAVACHOL) 40 MG tablet Take 1 tablet (40 mg total) by mouth daily. 06/30/20   Janora Norlander, DO  tamsulosin (FLOMAX) 0.4 MG CAPS capsule Take 1 capsule (0.4 mg total) by mouth daily. 01/15/21   Irine Seal, MD  TRELEGY ELLIPTA 100-62.5-25 MCG/INH AEPB USE 1 INHALATION ORALLY    DAILY 05/12/20   Collene Gobble, MD    Family History Family History  Problem Relation Age of Onset  . Asthma Mother   . COPD Mother   . Cancer Maternal Grandmother        stomach  . Early death Father 4       Tractor Accident    Social History Social History    Tobacco Use  . Smoking status: Former Smoker    Packs/day: 2.00    Years: 40.00    Pack years: 80.00    Types: Cigarettes    Quit date: 12/06/2006    Years since quitting: 14.1  . Smokeless tobacco: Never Used  Vaping Use  . Vaping Use: Never used  Substance Use Topics  . Alcohol use: Yes    Alcohol/week: 0.0 standard drinks    Comment: VERY LITTLE  . Drug use: No     Allergies   Patient has no known allergies.   Review of Systems Review of Systems   Physical Exam Triage Vital Signs ED Triage Vitals [01/14/21 1922]  Enc Vitals Group     BP (!) 145/87     Pulse Rate 86     Resp 20     Temp 98.3 F (36.8 C)     Temp src      SpO2 90 %     Weight      Height      Head Circumference      Peak Flow      Pain Score      Pain Loc      Pain Edu?      Excl. in [place]?    No data found.  Updated Vital Signs BP (!) 145/87   Pulse 86   Temp 98.3 F (36.8 C)   Resp 20   SpO2 90%   Visual Acuity Right Eye Distance:   Left Eye Distance:   Bilateral Distance:    Right Eye Near:   Left Eye Near:    Bilateral Near:     Physical Exam Vitals and nursing note reviewed.  Constitutional:      General: He is not in acute distress.    Appearance: Normal appearance. He is well-developed and well-nourished. He is not ill-appearing.  HENT:     Head: Normocephalic and atraumatic.     Nose: Nose normal.     Mouth/Throat:     Mouth: Mucous membranes are moist.     Pharynx: Oropharynx is clear.  Eyes:     Conjunctiva/sclera: Conjunctivae normal.  Cardiovascular:     Rate and Rhythm: Normal rate and regular rhythm.     Heart sounds: No murmur heard.   Pulmonary:     Effort: Pulmonary effort is normal. No respiratory distress.     Breath sounds: Normal breath sounds.  Abdominal:     Palpations: Abdomen is soft.     Tenderness: There is no abdominal tenderness.  Genitourinary:    Comments:  Foley catheter present, urinary return is dark brown and red with clots  and sediment present Musculoskeletal:        General: No edema. Normal range of motion.     Cervical back: Normal range of motion and neck supple.  Skin:    General: Skin is warm and dry.     Capillary Refill: Capillary refill takes less than 2 seconds.  Neurological:     General: No focal deficit present.     Mental Status: He is alert and oriented to person, place, and time. Mental status is at baseline.  Psychiatric:        Mood and Affect: Mood and affect and mood normal.        Behavior: Behavior normal.        Thought Content: Thought content normal.      UC Treatments / Results  Labs (all labs ordered are listed, but only abnormal results are displayed) Labs Reviewed  POCT URINALYSIS DIP (MANUAL ENTRY) - Abnormal; Notable for the following components:      Result Value   Color, UA red (*)    Bilirubin, UA small (*)    Spec Grav, UA >=1.030 (*)    Blood, UA large (*)    Protein Ur, POC =100 (*)    All other components within normal limits  URINE CULTURE    EKG   Radiology No results found.  Procedures Procedures (including critical care time)  Medications Ordered in UC Medications - No data to display  Initial Impression / Assessment and Plan / UC Course  I have reviewed the triage vital signs and the nursing notes.  Pertinent labs & imaging results that were available during my care of the patient were reviewed by me and considered in my medical decision making (see chart for details).    Gross hematuria Foley Catheter in place  UA not concerning for infection Foley catheter care and bag change performed Throughout the visit, urine lightened up some to a more amber color Discussed with patient that if he noticed bright red frank blood to go to the ER for further evaluation and treatment Otherwise, I think he is ok to follow up with urology in the morning Final Clinical Impressions(s) / UC Diagnoses   Final diagnoses:  Gross hematuria  Foley  catheter in place     Discharge Instructions     You may follow up with urology in the morning  If you notice bright red frank blood, follow up with the ER before then    ED Prescriptions    None     PDMP not reviewed this encounter.   Faustino Congress, NP 01/19/21 1151

## 2021-01-20 ENCOUNTER — Encounter: Payer: Self-pay | Admitting: Urology

## 2021-01-20 ENCOUNTER — Other Ambulatory Visit: Payer: Self-pay

## 2021-01-20 ENCOUNTER — Ambulatory Visit (INDEPENDENT_AMBULATORY_CARE_PROVIDER_SITE_OTHER): Payer: Medicare Other | Admitting: Urology

## 2021-01-20 VITALS — BP 153/89 | HR 96 | Temp 98.4°F

## 2021-01-20 DIAGNOSIS — N138 Other obstructive and reflux uropathy: Secondary | ICD-10-CM

## 2021-01-20 DIAGNOSIS — N401 Enlarged prostate with lower urinary tract symptoms: Secondary | ICD-10-CM | POA: Diagnosis not present

## 2021-01-20 DIAGNOSIS — R339 Retention of urine, unspecified: Secondary | ICD-10-CM

## 2021-01-20 LAB — URINALYSIS, ROUTINE W REFLEX MICROSCOPIC
Bilirubin, UA: NEGATIVE
Glucose, UA: NEGATIVE
Ketones, UA: NEGATIVE
Nitrite, UA: NEGATIVE
Specific Gravity, UA: 1.02 (ref 1.005–1.030)
Urobilinogen, Ur: 1 mg/dL (ref 0.2–1.0)
pH, UA: 6.5 (ref 5.0–7.5)

## 2021-01-20 LAB — MICROSCOPIC EXAMINATION
Bacteria, UA: NONE SEEN
RBC, Urine: >30 /hpf — AB (ref 0–2)
Renal Epithel, UA: NONE SEEN /hpf

## 2021-01-20 MED ORDER — CEPHALEXIN 500 MG PO CAPS: 500.0000 mg | ORAL_CAPSULE | Freq: Two times a day (BID) | ORAL | 0 refills | Status: AC

## 2021-01-20 NOTE — Progress Notes (Signed)
History of Present Illness:   CC/HPI: I have trouble rolling my foreskin back.  This man underwent circumcision on 01/28/2017. He has had no issues since his circumcision. .  CC: I am having trouble with my erections. HPI: Brent Snow is a 80 year-old male established patient who is here for erectile dysfunction.  He has been on increasing doses of sildenafil. Now, 100 mg does not work effectively. He would like to consider other treatment.  2.15.2022: Brent Snow is here today following a visit here last week as well as a visit to urgent care. He was seen for retention on 2.9.2022 and had a catheter placed at that time. He reports that his acute symptoms presented suddenly on 2.8.2022. He denies any associated constipation or decongestant use. He reports that leading up to this episode he was experiencing some urinary hesitancy and a weakened FOS. He was started on tamsulosin.  Past Medical History:  Diagnosis Date  . Arthritis   . COPD, severe ([place])    pulmologist-  dr byrum  . Dyspnea on exertion    prescribed to use O2 via [place]--- per pt he does not use O2 he just take 1-2 puffs of rescue inhaler  . Hiatal hernia   . 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 Hypertension   . Hypoxemic respiratory failure, chronic (Brent Snow)    pulmologist-  dr byrum-- prescribed supplemental O2 @ 2L via Brent Snow   . OSA on CPAP    and added O2 @ 2L via  w/ cpap  . Phimosis   . S/P balloon dilatation of esophageal stricture    multiple   . Type 2 diabetes mellitus (Brent Snow)    followed by pcp--  per note uncontrolled w/ last A1c >14 on 12-30-2016  . Wears contact lenses     Past Surgical History:  Procedure Laterality Date  . BACK SURGERY    . CIRCUMCISION N/A 01/27/2017   Procedure: CIRCUMCISION ADULT;  Surgeon: Franchot Gallo, MD;  Location: [facility];  Service: Urology;  Laterality: N/A;  . KNEE ARTHROSCOPY Right 2008  .  LUMBAR SPINE SURGERY  1977  approx.  . TONSILLECTOMY AND ADENOIDECTOMY  age 18  . TRANSTHORACIC ECHOCARDIOGRAM  09/12/2015   mild LVH,  ef 65-70%,  grade 1 diastolic dysfunction/  mild RAE    Home Medications:  Allergies as of 01/20/2021   No Known Allergies     Medication List       Accurate as of January 20, 2021 12:04 PM. If you have any questions, ask your nurse or doctor.        albuterol 108 (90 Base) MCG/ACT inhaler Commonly known as: ProAir HFA Inhale 2 puffs into the lungs every 4 (four) hours as needed.   glucose blood test strip Check BS QD and PRN   Ibuprofen 200 MG Caps Take 2 capsules by mouth as needed.   lisinopril 20 MG tablet Commonly known as: ZESTRIL Take 1 tablet (20 mg total) by mouth daily.   metFORMIN 500 MG tablet Commonly known as: GLUCOPHAGE Take 1 tablet (500 mg total) by mouth daily with breakfast.   onetouch ultrasoft lancets   pravastatin 40 MG tablet Commonly known as: PRAVACHOL Take 1 tablet (40 mg total) by mouth daily.   tamsulosin 0.4 MG Caps capsule Commonly known as: FLOMAX Take 1 capsule (0.4 mg total) by mouth daily.   Trelegy Ellipta 100-62.5-25 MCG/INH Aepb Generic  drug: Fluticasone-Umeclidin-Vilant USE 1 INHALATION ORALLY    DAILY   Trulicity 1.5 ZO/1.0RU Sopn Generic drug: Dulaglutide Inject 0.5 mLs (1.5 mg total) into the skin once a week.       Allergies: No Known Allergies  Family History  Problem Relation Age of Onset  . Asthma Mother   . COPD Mother   . Cancer Maternal Grandmother        stomach  . Early death Father 69       Tractor Accident    Social History:  reports that he quit smoking about 14 years ago. His smoking use included cigarettes. He has a 80.00 pack-year smoking history. He has never used smokeless tobacco. He reports current alcohol use. He reports that he does not use drugs.  ROS: A complete review of systems was performed.  All systems are negative except for pertinent findings  as noted.  Physical Exam:  Vital signs in last 24 hours: BP (!) 153/89   Pulse 96   Temp 98.4 F (36.9 C)  Constitutional:  Alert and oriented, No acute distress Cardiovascular: Regular rate  Respiratory: Normal respiratory effort GI: Abdomen is soft, nontender, nondistended, no abdominal masses. No CVAT. No hernias. Genitourinary: Normal male phallus, testes are descended bilaterally and non-tender and without masses, scrotum is normal in appearance without lesions or masses, perineum is normal on inspection. Prostate feels about 50 grams. Neurologic: Grossly intact, no focal deficits Psychiatric: Normal mood and affect  I have reviewed prior pt notes  I have reviewed notes from referring/previous physicians  I have reviewed urinalysis results    Impression/Assessment:  BPH with retention, catheter removed today    Plan:  1.  He was given cephalexin to cover the catheter placement  2.  Continue tamsulosin  3.  I will see back in a couple of months.  At that time we will recheck symptoms, and check PSA before he presents.

## 2021-01-20 NOTE — Progress Notes (Signed)
Urological Symptom Review  Patient is experiencing the following symptoms: Frequent urination Hard to postpone urination Burning/pain with urination Get up at night to urinate Leakage of urine Stream starts and stops Trouble starting stream Have to strain to urinate Erection problems (male only)   Review of Systems  Gastrointestinal (upper)  : Negative for upper GI symptoms  Gastrointestinal (lower) : Negative for lower GI symptoms  Constitutional : Fatigue  Skin: Negative for skin symptoms  Eyes: Negative for eye symptoms  Ear/Nose/Throat : Negative for Ear/Nose/Throat symptoms  Hematologic/Lymphatic: Easy bruising  Cardiovascular : Negative for cardiovascular symptoms  Respiratory : Shortness of breath  Endocrine: Negative for endocrine symptoms  Musculoskeletal: Back pain  Neurological: Negative for neurological symptoms  Psychologic: Negative for psychiatric symptoms

## 2021-01-20 NOTE — Progress Notes (Signed)
Fill and Pull Catheter Removal  Patient is present today for a catheter removal.  Patient was cleaned and prepped in a sterile fashion 159ml of sterile water/ saline was instilled into the bladder when the patient felt the urge to urinate. 1ml of water was then drained from the balloon.  A 16FR foley cath was removed from the bladder no complications were noted .  Patient as then given some time to void on their own.  Patient can void  100 ml on their own after some time.  Patient tolerated well.  Performed by: Takeira Yanes, lpn  Follow up/ Additional notes: Per MD note

## 2021-03-30 NOTE — Progress Notes (Signed)
History of Present Illness: Pt here for followup of AUR--catheter placed.  2.15.2022: Brent Snow is here today following a visit here last week as well as a visit to urgent care. He was seen for retention on 2.9.2022 and had a catheter placed at that time. He reports that his acute symptoms presented suddenly on 2.8.2022. He denies any associated constipation or decongestant use. He reports that leading up to this episode he was experiencing some urinary hesitancy and a weakened FOS. He was started on tamsulosin.  4.26.2022: He is now off of tamsulosin which was just a short course of medicine.  Overall, he is voiding adequately and denies any significant slow stream.  He states that he has better pressure with urination.  He would like a refill on his sildenafil.  Past Medical History:  Diagnosis Date  . Arthritis   . COPD, severe ([place])    pulmologist-  dr byrum  . Dyspnea on exertion    prescribed to use O2 via [place]--- per pt he does not use O2 he just take 1-2 puffs of rescue inhaler  . Hiatal hernia   . 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 Hypertension   . Hypoxemic respiratory failure, chronic ([place])    pulmologist-  dr byrum-- prescribed supplemental O2 @ 2L via [place]   . OSA on CPAP    and added O2 @ 2L via [place] w/ cpap  . Phimosis   . S/P balloon dilatation of esophageal stricture    multiple   . Type 2 diabetes mellitus ([place])    followed by pcp--  per note uncontrolled w/ last A1c >14 on 12-30-2016  . Wears contact lenses     Past Surgical History:  Procedure Laterality Date  . BACK SURGERY    . CIRCUMCISION N/A 01/27/2017   Procedure: CIRCUMCISION ADULT;  Surgeon: Franchot Gallo, MD;  Location: [facility];  Service: Urology;  Laterality: N/A;  . KNEE ARTHROSCOPY Right 2008  . LUMBAR SPINE SURGERY  1977  approx.  . TONSILLECTOMY AND ADENOIDECTOMY  age 23  . TRANSTHORACIC ECHOCARDIOGRAM  09/12/2015    mild LVH,  ef 65-70%,  grade 1 diastolic dysfunction/  mild RAE    Home Medications:  Allergies as of 03/31/2021   No Known Allergies     Medication List       Accurate as of March 30, 2021  7:35 PM. If you have any questions, ask your nurse or doctor.        albuterol 108 (90 Base) MCG/ACT inhaler Commonly known as: ProAir HFA Inhale 2 puffs into the lungs every 4 (four) hours as needed.   glucose blood test strip Check BS QD and PRN   Ibuprofen 200 MG Caps Take 2 capsules by mouth as needed.   lisinopril 20 MG tablet Commonly known as: ZESTRIL Take 1 tablet (20 mg total) by mouth daily.   metFORMIN 500 MG tablet Commonly known as: GLUCOPHAGE Take 1 tablet (500 mg total) by mouth daily with breakfast.   onetouch ultrasoft lancets   pravastatin 40 MG tablet Commonly known as: PRAVACHOL Take 1 tablet (40 mg total) by mouth daily.   tamsulosin 0.4 MG Caps capsule Commonly known as: FLOMAX Take 1 capsule (0.4 mg total) by mouth daily.   Trelegy Ellipta 100-62.5-25 MCG/INH Aepb Generic drug: Fluticasone-Umeclidin-Vilant USE 1 INHALATION ORALLY    DAILY   Trulicity 1.5 ZO/1.0RU Sopn Generic drug: Dulaglutide  Inject 0.5 mLs (1.5 mg total) into the skin once a week.       Allergies: No Known Allergies  Family History  Problem Relation Age of Onset  . Asthma Mother   . COPD Mother   . Cancer Maternal Grandmother        stomach  . Early death Father 96       Tractor Accident    Social History:  reports that he quit smoking about 14 years ago. His smoking use included cigarettes. He has a 80.00 pack-year smoking history. He has never used smokeless tobacco. He reports current alcohol use. He reports that he does not use drugs.  ROS: A complete review of systems was performed.  All systems are negative except for pertinent findings as noted.  Physical Exam:  Vital signs in last 24 hours: There were no vitals taken for this visit. Constitutional:  Alert  and oriented, No acute distress Cardiovascular: Regular rate  Respiratory: Normal respiratory effort Lymphatic: No lymphadenopathy Neurologic: Grossly intact, no focal deficits Psychiatric: Normal mood and affect  I have reviewed prior pt notes  I have reviewed urinalysis results       Impression/Assessment:  BPH with obstruction, doing better without medical therapy alone although I think he might need to be on this to preclude further retention episodes  ED, organic.  Refill on Viagra requested  Plan:  1.  I would recommend that he get back on tamsulosin and this was sent to  2.  Sildenafil refilled  3.  I will see back in 1 year.

## 2021-03-31 ENCOUNTER — Encounter: Payer: Self-pay | Admitting: Urology

## 2021-03-31 ENCOUNTER — Ambulatory Visit (INDEPENDENT_AMBULATORY_CARE_PROVIDER_SITE_OTHER): Payer: Medicare Other | Admitting: Urology

## 2021-03-31 ENCOUNTER — Other Ambulatory Visit: Payer: Self-pay

## 2021-03-31 VITALS — BP 158/84 | HR 67 | Temp 98.3°F | Wt 205.0 lb

## 2021-03-31 DIAGNOSIS — N401 Enlarged prostate with lower urinary tract symptoms: Secondary | ICD-10-CM | POA: Diagnosis not present

## 2021-03-31 DIAGNOSIS — R339 Retention of urine, unspecified: Secondary | ICD-10-CM

## 2021-03-31 DIAGNOSIS — N138 Other obstructive and reflux uropathy: Secondary | ICD-10-CM | POA: Diagnosis not present

## 2021-03-31 LAB — URINALYSIS, ROUTINE W REFLEX MICROSCOPIC
Bilirubin, UA: NEGATIVE
Glucose, UA: NEGATIVE
Ketones, UA: NEGATIVE
Leukocytes,UA: NEGATIVE
Nitrite, UA: NEGATIVE
Protein,UA: NEGATIVE
RBC, UA: NEGATIVE
Specific Gravity, UA: 1.025 (ref 1.005–1.030)
Urobilinogen, Ur: 0.2 mg/dL (ref 0.2–1.0)
pH, UA: 6.5 (ref 5.0–7.5)

## 2021-03-31 MED ORDER — TAMSULOSIN HCL 0.4 MG PO CAPS: 0.4000 mg | ORAL_CAPSULE | Freq: Every day | ORAL | 3 refills | Status: DC

## 2021-03-31 MED ORDER — SILDENAFIL CITRATE 100 MG PO TABS: 100.0000 mg | ORAL_TABLET | Freq: Every day | ORAL | 99 refills | Status: DC | PRN

## 2021-03-31 NOTE — Progress Notes (Signed)

## 2021-04-30 ENCOUNTER — Other Ambulatory Visit: Payer: Self-pay | Admitting: Family Medicine

## 2021-04-30 DIAGNOSIS — E1169 Type 2 diabetes mellitus with other specified complication: Secondary | ICD-10-CM

## 2021-05-31 ENCOUNTER — Other Ambulatory Visit: Payer: Self-pay | Admitting: Emergency Medicine

## 2021-06-28 ENCOUNTER — Other Ambulatory Visit: Payer: Self-pay | Admitting: Family Medicine

## 2021-06-28 DIAGNOSIS — E119 Type 2 diabetes mellitus without complications: Secondary | ICD-10-CM

## 2021-07-06 ENCOUNTER — Encounter: Payer: Self-pay | Admitting: Family Medicine

## 2021-07-06 ENCOUNTER — Other Ambulatory Visit: Payer: Self-pay

## 2021-07-06 ENCOUNTER — Ambulatory Visit (INDEPENDENT_AMBULATORY_CARE_PROVIDER_SITE_OTHER): Payer: Medicare Other | Admitting: Family Medicine

## 2021-07-06 VITALS — BP 140/73 | HR 69 | Temp 97.1°F | Ht 65.0 in | Wt 200.8 lb

## 2021-07-06 DIAGNOSIS — E1159 Type 2 diabetes mellitus with other circulatory complications: Secondary | ICD-10-CM | POA: Diagnosis not present

## 2021-07-06 DIAGNOSIS — Z23 Encounter for immunization: Secondary | ICD-10-CM | POA: Diagnosis not present

## 2021-07-06 DIAGNOSIS — N138 Other obstructive and reflux uropathy: Secondary | ICD-10-CM | POA: Diagnosis not present

## 2021-07-06 DIAGNOSIS — D692 Other nonthrombocytopenic purpura: Secondary | ICD-10-CM

## 2021-07-06 DIAGNOSIS — R339 Retention of urine, unspecified: Secondary | ICD-10-CM | POA: Diagnosis not present

## 2021-07-06 DIAGNOSIS — E1169 Type 2 diabetes mellitus with other specified complication: Secondary | ICD-10-CM

## 2021-07-06 DIAGNOSIS — E785 Hyperlipidemia, unspecified: Secondary | ICD-10-CM

## 2021-07-06 DIAGNOSIS — N401 Enlarged prostate with lower urinary tract symptoms: Secondary | ICD-10-CM | POA: Diagnosis not present

## 2021-07-06 DIAGNOSIS — E875 Hyperkalemia: Secondary | ICD-10-CM | POA: Diagnosis not present

## 2021-07-06 DIAGNOSIS — I152 Hypertension secondary to endocrine disorders: Secondary | ICD-10-CM

## 2021-07-06 LAB — BAYER DCA HB A1C WAIVED: HB A1C (BAYER DCA - WAIVED): 6.4 % (ref <7.0)

## 2021-07-06 MED ORDER — TRULICITY 1.5 MG/0.5ML ~~LOC~~ SOAJ: 1.5000 mg | SUBCUTANEOUS | 4 refills | Status: DC

## 2021-07-06 MED ORDER — TRELEGY ELLIPTA 100-62.5-25 MCG/INH IN AEPB: INHALATION_SPRAY | RESPIRATORY_TRACT | 3 refills | Status: DC

## 2021-07-06 MED ORDER — PRAVASTATIN SODIUM 40 MG PO TABS: 40.0000 mg | ORAL_TABLET | Freq: Every day | ORAL | 3 refills | Status: DC

## 2021-07-06 MED ORDER — LISINOPRIL 20 MG PO TABS: 20.0000 mg | ORAL_TABLET | Freq: Every day | ORAL | 3 refills | Status: DC

## 2021-07-06 MED ORDER — METFORMIN HCL 500 MG PO TABS: ORAL_TABLET | ORAL | 0 refills | Status: DC

## 2021-07-06 MED ORDER — METFORMIN HCL 500 MG PO TABS: 500.0000 mg | ORAL_TABLET | Freq: Every day | ORAL | 3 refills | Status: DC

## 2021-07-06 NOTE — Patient Instructions (Signed)
Come in for fasting cholesterol when you can  Preventive Care 78 Years and Older, Male Preventive care refers to lifestyle choices and visits with your health care provider that can promote health and wellness. This includes: A yearly physical exam. This is also called an annual wellness visit. Regular dental and eye exams. Immunizations. Screening for certain conditions. Healthy lifestyle choices, such as: Eating a healthy diet. Getting regular exercise. Not using drugs or products that contain nicotine and tobacco. Limiting alcohol use. What can I expect for my preventive care visit? Physical exam Your health care provider will check your: Height and weight. These may be used to calculate your BMI (body mass index). BMI is a measurement that tells if you are at a healthy weight. Heart rate and blood pressure. Body temperature. Skin for abnormal spots. Counseling Your health care provider may ask you questions about your: Past medical problems. Family's medical history. Alcohol, tobacco, and drug use. Emotional well-being. Home life and relationship well-being. Sexual activity. Diet, exercise, and sleep habits. History of falls. Memory and ability to understand (cognition). Work and work Statistician. Access to firearms. What immunizations do I need?  Vaccines are usually given at various ages, according to a schedule. Your health care provider will recommend vaccines for you based on your age, medicalhistory, and lifestyle or other factors, such as travel or where you work. What tests do I need? Blood tests Lipid and cholesterol levels. These may be checked every 5 years, or more often depending on your overall health. Hepatitis C test. Hepatitis B test. Screening Lung cancer screening. You may have this screening every year starting at age 62 if you have a 30-pack-year history of smoking and currently smoke or have quit within the past 15 years. Colorectal cancer  screening. All adults should have this screening starting at age 14 and continuing until age 23. Your health care provider may recommend screening at age 60 if you are at increased risk. You will have tests every 1-10 years, depending on your results and the type of screening test. Prostate cancer screening. Recommendations will vary depending on your family history and other risks. Genital exam to check for testicular cancer or hernias. Diabetes screening. This is done by checking your blood sugar (glucose) after you have not eaten for a while (fasting). You may have this done every 1-3 years. Abdominal aortic aneurysm (AAA) screening. You may need this if you are a current or former smoker. STD (sexually transmitted disease) testing, if you are at risk. Follow these instructions at home: Eating and drinking  Eat a diet that includes fresh fruits and vegetables, whole grains, lean protein, and low-fat dairy products. Limit your intake of foods with high amounts of sugar, saturated fats, and salt. Take vitamin and mineral supplements as recommended by your health care provider. Do not drink alcohol if your health care provider tells you not to drink. If you drink alcohol: Limit how much you have to 0-2 drinks a day. Be aware of how much alcohol is in your drink. In the [place] one drink equals one 12 oz bottle of beer (355 mL), one 5 oz glass of wine (148 mL), or one 1 oz glass of hard liquor (44 mL).  Lifestyle Take daily care of your teeth and gums. Brush your teeth every morning and night with fluoride toothpaste. Floss one time each day. Stay active. Exercise for at least 30 minutes 5 or more days each week. Do not use any products that contain nicotine  or tobacco, such as cigarettes, e-cigarettes, and chewing tobacco. If you need help quitting, ask your health care provider. Do not use drugs. If you are sexually active, practice safe sex. Use a condom or other form of protection to  prevent STIs (sexually transmitted infections). Talk with your health care provider about taking a low-dose aspirin or statin. Find healthy ways to cope with stress, such as: Meditation, yoga, or listening to music. Journaling. Talking to a trusted person. Spending time with friends and family. Safety Always wear your seat belt while driving or riding in a vehicle. Do not drive: If you have been drinking alcohol. Do not ride with someone who has been drinking. When you are tired or distracted. While texting. Wear a helmet and other protective equipment during sports activities. If you have firearms in your house, make sure you follow all gun safety procedures. What's next? Visit your health care provider once a year for an annual wellness visit. Ask your health care provider how often you should have your eyes and teeth checked. Stay up to date on all vaccines. This information is not intended to replace advice given to you by your health care provider. Make sure you discuss any questions you have with your healthcare provider. Document Revised: 08/21/2019 Document Reviewed: 11/16/2018 Elsevier Patient Education  2022 [employer].

## 2021-07-06 NOTE — Progress Notes (Signed)
Subjective: CC: HTN, HLD, DM PCP: Janora Norlander, DO Brent Snow is a 80 y.o. male presenting to clinic today for:  1. Type 2 Diabetes with hypertension, hyperlipidemia:  Patient is compliant with Trulicity weekly, metformin 1000 mg daily, Pravachol 40 mg daily, lisinopril 20 mg daily.  He feels well.  He continues to stay physically active with his wife, frequently traveling to the beach and working in their [employer].  He has a little dog whom he walks.  Last eye exam: UTD Last foot exam: needs Last A1c:  Lab Results  Component Value Date   HGBA1C 6.6 06/30/2020   Nephropathy screen indicated?: UTD Last flu, zoster and/or pneumovax:  Immunization History  Administered Date(s) Administered   Fluad Quad(high Dose 65+) 10/25/2019, 09/01/2020   Influenza, High Dose Seasonal PF 10/18/2018   Moderna Sars-Covid-2 Vaccination 12/18/2019   PFIZER(Purple Top)SARS-COV-2 Vaccination 02/15/2020   Pneumococcal Conjugate-13 10/24/2013   Pneumococcal Polysaccharide-23 12/06/2006, 10/11/2012, 11/03/2016   Td 05/17/2006    ROS: Has chronic numbness in the left foot after back surgery in the 70s.  No foot ulcerations.  No chest pain, shortness of breath.  No change in exercise tolerance.  No visual disturbance.  He had cataract surgery and if anything his vision is better.  2. BPH w/ urinary obstruction Managed by Dr Diona Fanti.  Last OV 03/31/21.  Denies any BPH symptoms.  He reports good flow with Flomax.  No hematuria.   ROS: Per HPI  No Known Allergies Past Medical History:  Diagnosis Date   Arthritis    COPD, severe ([place])    pulmologist-  dr byrum   Dyspnea on exertion    prescribed to use O2 via [place]--- per pt he does not use O2 he just take 1-2 puffs of rescue inhaler   Hiatal hernia    History of acute respiratory failure    02-18-2007  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   Hypertension    Hypoxemic respiratory failure, chronic (HCC)    pulmologist-   dr byrum-- prescribed supplemental O2 @ 2L via [place]    OSA on CPAP    and added O2 @ 2L via [place] w/ cpap   Phimosis    S/P balloon dilatation of esophageal stricture    multiple    Type 2 diabetes mellitus ([place])    followed by pcp--  per note uncontrolled w/ last A1c >14 on 12-30-2016   Wears contact lenses     Current Outpatient Medications:    albuterol (PROAIR HFA) 108 (90 Base) MCG/ACT inhaler, Inhale 2 puffs into the lungs every 4 (four) hours as needed., Disp: 3 Inhaler, Rfl: 0   Dulaglutide (TRULICITY) 1.5 WN/4.6EV SOPN, Inject 1.5 mg into the skin once a week. (NEEDS TO BE SEEN BEFORE NEXT REFILL), Disp: 6 mL, Rfl: 0   glucose blood test strip, Check BS QD and PRN, Disp: 100 each, Rfl: 5   Ibuprofen 200 MG CAPS, Take 2 capsules by mouth as needed. , Disp: , Rfl:    Lancets (ONETOUCH ULTRASOFT) lancets, , Disp: , Rfl:    lisinopril (ZESTRIL) 20 MG tablet, Take 1 tablet (20 mg total) by mouth daily., Disp: 90 tablet, Rfl: 3   metFORMIN (GLUCOPHAGE) 500 MG tablet, TAKE 2 TABLETS BY MOUTH TWICE DAILY WITH A MEAL, Disp: 360 tablet, Rfl: 0   pravastatin (PRAVACHOL) 40 MG tablet, Take 1 tablet (40 mg total) by mouth daily., Disp: 90 tablet, Rfl: 3   sildenafil (VIAGRA)  100 MG tablet, Take 1 tablet (100 mg total) by mouth daily as needed for erectile dysfunction., Disp: 30 tablet, Rfl: prn   tamsulosin (FLOMAX) 0.4 MG CAPS capsule, Take 1 capsule (0.4 mg total) by mouth daily., Disp: 30 capsule, Rfl: 0   tamsulosin (FLOMAX) 0.4 MG CAPS capsule, Take 1 capsule (0.4 mg total) by mouth daily after supper., Disp: 90 capsule, Rfl: 3   TRELEGY ELLIPTA 100-62.5-25 MCG/INH AEPB, USE 1 INHALATION ORALLY    DAILY, Disp: 180 each, Rfl: 0 Social History   Socioeconomic History   Marital status: Married    Spouse name: Not on file   Number of children: 2   Years of education: 15   Highest education level: Some college, no degree  Occupational History   Occupation: Retired    Comment: Manufacturing engineer  Tobacco Use   Smoking status: Former    Packs/day: 2.00    Years: 40.00    Pack years: 80.00    Types: Cigarettes    Quit date: 12/06/2006    Years since quitting: 14.5   Smokeless tobacco: Never  Vaping Use   Vaping Use: Never used  Substance and Sexual Activity   Alcohol use: Yes    Alcohol/week: 0.0 standard drinks    Comment: VERY LITTLE   Drug use: No   Sexual activity: Not on file  Other Topics Concern   Not on file  Social History Narrative   Not on file   Social Determinants of Health   Financial Resource Strain: Not on file  Food Insecurity: Not on file  Transportation Needs: Not on file  Physical Activity: Not on file  Stress: Not on file  Social Connections: Not on file  Intimate Partner Violence: Not on file   Family History  Problem Relation Age of Onset   Asthma Mother    COPD Mother    Cancer Maternal Grandmother        stomach   Early death Father 108       Tractor Accident    Objective: Office vital signs reviewed. BP 140/73   Pulse 69   Temp (!) 97.1 F (36.2 C)   Ht '5\' 5"'  (1.651 m)   Wt 200 lb 12.8 oz (91.1 kg)   SpO2 92%   BMI 33.41 kg/m   Physical Examination:  General: Awake, alert, well appearing elderly male, No acute distress HEENT: Normal    Neck: No masses palpated. No lymphadenopathy.  No carotid bruits    Ears: Tympanic membranes intact, normal light reflex, no erythema, no bulging    Eyes: PERRLA, extraocular membranes intact, sclera white    Nose: nasal turbinates moist, no nasal discharge    Throat: moist mucus membranes, no erythema, no tonsillar exudate.  Airway is patent Cardio: regular rate and rhythm, S1S2 heard, no murmurs appreciated Pulm: Globally decreased breath sounds with normal work of breathing on room air.  No wheezes, rhonchi or rales GI: soft, non-tender, non-distended, bowel sounds present x4, no hepatomegaly, no splenomegaly, no masses Extremities: warm, well perfused, No edema,  cyanosis or clubbing; +2 pulses bilaterally MSK: normal gait and station; right knee with some soft tissue swelling and joint effusion noted.  No warmth or erythema.  He is ambulating independently Skin: Various healing abrasions noted along the upper extremity and lower extremity.  Senile purpura are also noted Neuro: Decreased light touch sensation throughout the left lower extremity  Assessment/ Plan: 80 y.o. male   Type 2 diabetes mellitus with  other specified complication, without long-term current use of insulin (HCC) - Plan: Bayer DCA Hb A1c Waived, pravastatin (PRAVACHOL) 40 MG tablet, Dulaglutide (TRULICITY) 1.5 SP/2.3RA SOPN, metFORMIN (GLUCOPHAGE) 500 MG tablet  Hypertension associated with diabetes ([place]) - Plan: CMP14+EGFR, lisinopril (ZESTRIL) 20 MG tablet  Hyperlipidemia associated with type 2 diabetes mellitus ([place]) - Plan: CMP14+EGFR, Lipid panel, TSH  BPH with urinary obstruction  Senile purpura ([place])  Sugar under excellent control.  We discussed the risk of adverse events in persons that are of advanced age with A1c less than 7.  Think that he has a little bit of wiggle room to have less tightly controlled blood sugar and have advised him to reduce his metformin to 1 tablet daily.  He will continue Trulicity for now.  Blood pressure well controlled.  Continue current regimen  He will have lipid panel collected at a later date due to nonfasting status  BPH is stable with Flomax when he remembers to take it.  PSA to be collected for Dr. Diona Fanti  Senile purpura likely secondary to thinning skin with advanced age  No orders of the defined types were placed in this encounter.  No orders of the defined types were placed in this encounter.    Janora Norlander, DO [place] [phone]

## 2021-07-07 LAB — TSH: TSH: 1.07 u[IU]/mL (ref 0.450–4.500)

## 2021-07-07 LAB — CMP14+EGFR
ALT: 18 IU/L (ref 0–44)
AST: 19 IU/L (ref 0–40)
Albumin/Globulin Ratio: 1.8 (ref 1.2–2.2)
Albumin: 4.3 g/dL (ref 3.7–4.7)
Alkaline Phosphatase: 73 IU/L (ref 44–121)
BUN/Creatinine Ratio: 16 (ref 10–24)
BUN: 16 mg/dL (ref 8–27)
Bilirubin Total: 0.4 mg/dL (ref 0.0–1.2)
CO2: 23 mmol/L (ref 20–29)
Calcium: 9.5 mg/dL (ref 8.6–10.2)
Chloride: 100 mmol/L (ref 96–106)
Creatinine, Ser: 1.01 mg/dL (ref 0.76–1.27)
Globulin, Total: 2.4 g/dL (ref 1.5–4.5)
Glucose: 109 mg/dL — ABNORMAL HIGH (ref 65–99)
Potassium: 5.7 mmol/L — ABNORMAL HIGH (ref 3.5–5.2)
Sodium: 136 mmol/L (ref 134–144)
Total Protein: 6.7 g/dL (ref 6.0–8.5)
eGFR: 76 mL/min/{1.73_m2} (ref >59)

## 2021-07-07 NOTE — Addendum Note (Signed)
Addended by: Everlean Cherry on: 07/07/2021 09:12 AM   Modules accepted: Orders

## 2021-07-08 LAB — SPECIMEN STATUS REPORT

## 2021-07-08 LAB — PSA: Prostate Specific Ag, Serum: 4.7 ng/mL — ABNORMAL HIGH (ref 0.0–4.0)

## 2021-07-09 ENCOUNTER — Other Ambulatory Visit: Payer: Self-pay

## 2021-07-09 ENCOUNTER — Other Ambulatory Visit: Payer: Medicare Other

## 2021-07-09 DIAGNOSIS — E875 Hyperkalemia: Secondary | ICD-10-CM

## 2021-07-09 DIAGNOSIS — E785 Hyperlipidemia, unspecified: Secondary | ICD-10-CM | POA: Diagnosis not present

## 2021-07-09 DIAGNOSIS — E1169 Type 2 diabetes mellitus with other specified complication: Secondary | ICD-10-CM

## 2021-07-10 LAB — LIPID PANEL
Chol/HDL Ratio: 2.5 ratio (ref 0.0–5.0)
Cholesterol, Total: 112 mg/dL (ref 100–199)
HDL: 44 mg/dL (ref >39)
LDL Chol Calc (NIH): 52 mg/dL (ref 0–99)
Triglycerides: 76 mg/dL (ref 0–149)
VLDL Cholesterol Cal: 16 mg/dL (ref 5–40)

## 2021-07-10 LAB — BMP8+EGFR
BUN/Creatinine Ratio: 17 (ref 10–24)
BUN: 16 mg/dL (ref 8–27)
CO2: 24 mmol/L (ref 20–29)
Calcium: 9.4 mg/dL (ref 8.6–10.2)
Chloride: 101 mmol/L (ref 96–106)
Creatinine, Ser: 0.94 mg/dL (ref 0.76–1.27)
Glucose: 130 mg/dL — ABNORMAL HIGH (ref 65–99)
Potassium: 4.7 mmol/L (ref 3.5–5.2)
Sodium: 139 mmol/L (ref 134–144)
eGFR: 83 mL/min/{1.73_m2} (ref >59)

## 2021-07-16 NOTE — Progress Notes (Signed)
Sent via mychart

## 2021-09-17 ENCOUNTER — Telehealth: Payer: Self-pay | Admitting: Family Medicine

## 2021-09-17 NOTE — Telephone Encounter (Signed)
Left message for patient to call back and schedule Medicare Annual Wellness Visit (AWV) to be completed by video or phone.   Last AWV: 05/07/2019  Please schedule at anytime with [facility]-Nurse Health Advisor.  45 minute appointment  Any questions, please contact me at [phone]      

## 2021-10-05 DIAGNOSIS — I781 Nevus, non-neoplastic: Secondary | ICD-10-CM | POA: Diagnosis not present

## 2021-10-05 DIAGNOSIS — L821 Other seborrheic keratosis: Secondary | ICD-10-CM | POA: Diagnosis not present

## 2021-10-05 DIAGNOSIS — Z85828 Personal history of other malignant neoplasm of skin: Secondary | ICD-10-CM | POA: Diagnosis not present

## 2021-10-05 DIAGNOSIS — L820 Inflamed seborrheic keratosis: Secondary | ICD-10-CM | POA: Diagnosis not present

## 2021-10-05 DIAGNOSIS — L570 Actinic keratosis: Secondary | ICD-10-CM | POA: Diagnosis not present

## 2021-10-26 ENCOUNTER — Ambulatory Visit (INDEPENDENT_AMBULATORY_CARE_PROVIDER_SITE_OTHER): Payer: Medicare Other | Admitting: *Deleted

## 2021-10-26 DIAGNOSIS — Z0000 Encounter for general adult medical examination without abnormal findings: Secondary | ICD-10-CM

## 2021-10-26 NOTE — Progress Notes (Signed)
MEDICARE ANNUAL WELLNESS VISIT  10/26/2021  Telephone Visit Disclaimer This Medicare AWV was conducted by telephone due to national recommendations for restrictions regarding the COVID-19 Pandemic (e.g. social distancing).  I verified, using two identifiers, that I am speaking with Brent Snow or their authorized healthcare agent. I discussed the limitations, risks, security, and privacy concerns of performing an evaluation and management service by telephone and the potential availability of an in-person appointment in the future. The patient expressed understanding and agreed to proceed.  Location of Patient: home Location of Provider (nurse):  in office  Subjective:    Brent Snow is a 80 y.o. male patient of Brent Norlander, DO who had a Medicare Annual Wellness Visit today via telephone. Brent Snow is Retired and lives with their spouse. he has 2 children. he reports that he is socially active and does interact with friends/family regularly. he is markedly physically active and enjoys playing pickle ball, playing golf and doing yard work..  Patient Care Team: Brent Norlander, DO as PCP - General (Family Medicine) Brent Gobble, MD as Consulting Physician (Pulmonary Disease) Brent Broad, MD as Consulting Physician (Physical Medicine and Rehabilitation)  Advanced Directives 10/26/2021 06/18/2020 05/21/2020 05/07/2019 01/27/2017 10/20/2015 03/14/2015  Does Patient Have a Medical Advance Directive? No Yes Yes No No No No  Type of Advance Directive - Brent Snow;Living will [place];Living will - - - -  Does patient want to make changes to medical advance directive? - No - Patient declined No - Patient declined - - - -  Copy of [place] in Chart? - No - copy requested No - copy requested - - - -  Would patient like information on creating a medical advance directive? No - Patient declined - - Yes  (MAU/Ambulatory/Procedural Areas - Information given) Yes (MAU/Ambulatory/Procedural Areas - Information given) No - patient declined information -    Hospital Utilization Over the Past 12 Months: # of hospitalizations or ER visits: 0 # of surgeries: 0  Review of Systems    Patient reports that his overall health is unchanged compared to last year.  History obtained from chart review and the patient  Patient Reported Readings (BP, Pulse, CBG, Weight, etc) none  Pain Assessment Pain : No/denies pain     Current Medications & Allergies (verified) Allergies as of 10/26/2021   No Known Allergies      Medication List        Accurate as of October 26, 2021  9:51 AM. If you have any questions, ask your nurse or doctor.          albuterol 108 (90 Base) MCG/ACT inhaler Commonly known as: ProAir HFA Inhale 2 puffs into the lungs every 4 (four) hours as needed.   glucose blood test strip Check BS QD and PRN   Ibuprofen 200 MG Caps Take 2 capsules by mouth as needed.   lisinopril 20 MG tablet Commonly known as: ZESTRIL Take 1 tablet (20 mg total) by mouth daily.   metFORMIN 500 MG tablet Commonly known as: GLUCOPHAGE Take 1 tablet (500 mg total) by mouth daily with breakfast. TAKE 2 TABLETS BY MOUTH TWICE DAILY WITH A MEAL   onetouch ultrasoft lancets   pravastatin 40 MG tablet Commonly known as: PRAVACHOL Take 1 tablet (40 mg total) by mouth daily.   sildenafil 100 MG tablet Commonly known as: VIAGRA Take 1 tablet (100 mg total) by mouth daily as needed for  erectile dysfunction.   tamsulosin 0.4 MG Caps capsule Commonly known as: FLOMAX Take 1 capsule (0.4 mg total) by mouth daily after supper.   Trelegy Ellipta 100-62.5-25 MCG/ACT Aepb Generic drug: Fluticasone-Umeclidin-Vilant USE 1 INHALATION ORALLY    DAILY   Trulicity 1.5 WG/9.5AO Sopn Generic drug: Dulaglutide Inject 1.5 mg into the skin once a week.        History (reviewed): Past Medical  History:  Diagnosis Date   Arthritis    COPD, severe ([place])    pulmologist-  dr byrum   Dyspnea on exertion    prescribed to use O2 via [place]--- per pt he does not use O2 he just take 1-2 puffs of rescue inhaler   Hiatal hernia    History of acute respiratory failure    02-18-2007  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   Hypertension    Hypoxemic respiratory failure, chronic (HCC)    pulmologist-  dr byrum-- prescribed supplemental O2 @ 2L via [place]    OSA on CPAP    and added O2 @ 2L via [place] w/ cpap   Phimosis    S/P balloon dilatation of esophageal stricture    multiple    Type 2 diabetes mellitus ([place])    followed by pcp--  per note uncontrolled w/ last A1c >14 on 12-30-2016   Wears contact lenses    Past Surgical History:  Procedure Laterality Date   BACK SURGERY     CIRCUMCISION N/A 01/27/2017   Procedure: CIRCUMCISION ADULT;  Surgeon: Brent Gallo, MD;  [facility];  Service: Urology;  Laterality: N/A;   KNEE ARTHROSCOPY Right 2008   LUMBAR SPINE SURGERY  1977  approx.   TONSILLECTOMY AND ADENOIDECTOMY  age 77   TRANSTHORACIC ECHOCARDIOGRAM  09/12/2015   mild LVH,  ef 65-70%,  grade 1 diastolic dysfunction/  mild RAE   Family History  Problem Relation Age of Onset   Asthma Mother    COPD Mother    Cancer Maternal Grandmother        stomach   Early death Father 10       Tractor Accident   Social History   Socioeconomic History   Marital status: Married    Spouse name: Not on file   Number of children: 2   Years of education: 15   Highest education level: Some college, no degree  Occupational History   Occupation: Retired    Comment: Secondary school teacher  Tobacco Use   Smoking status: Former    Packs/day: 2.00    Years: 40.00    Pack years: 80.00    Types: Cigarettes    Quit date: 12/06/2006    Years since quitting: 14.8   Smokeless tobacco: Never  Vaping Use   Vaping Use: Never used  Substance and Sexual  Activity   Alcohol use: Yes    Alcohol/week: 0.0 standard drinks    Comment: VERY LITTLE   Drug use: No   Sexual activity: Not on file  Other Topics Concern   Not on file  Social History Narrative   Not on file   Social Determinants of Health   Financial Resource Strain: Low Risk    Difficulty of Paying Living Expenses: Not hard at all  Food Insecurity: No Food Insecurity   Worried About Charity fundraiser in the Last Year: Never true   Ran Out of Food in the Last Year: Never true  Transportation Needs: No Transportation Needs  Lack of Transportation (Medical): No   Lack of Transportation (Non-Medical): No  Physical Activity: Sufficiently Active   Days of Exercise per Week: 3 days   Minutes of Exercise per Session: 60 min  Stress: No Stress Concern Present   Feeling of Stress : Not at all  Social Connections: Socially Integrated   Frequency of Communication with Friends and Family: More than three times a week   Frequency of Social Gatherings with Friends and Family: More than three times a week   Attends Religious Services: More than 4 times per year   Active Member of [employer] or Organizations: Yes   Attends Archivist Meetings: More than 4 times per year   Marital Status: Married    Activities of Daily Living In your present state of health, do you have any difficulty performing the following activities: 10/26/2021  Hearing? N  Vision? N  Difficulty concentrating or making decisions? N  Walking or climbing stairs? Y  Comment has some trouble with stairs due to his knees  Dressing or bathing? N  Doing errands, shopping? N  Preparing Food and eating ? N  Using the Toilet? N  In the past six months, have you accidently leaked urine? N  Do you have problems with loss of bowel control? N  Managing your Medications? N  Managing your Finances? N  Housekeeping or managing your Housekeeping? N  Some recent data might be hidden    Patient Education/  Literacy How often do you need to have someone help you when you read instructions, pamphlets, or other written materials from your doctor or pharmacy?: 1 - Never What is the last grade level you completed in school?: 2 years of College-No degree  Exercise Current Exercise Habits: Home exercise routine, Type of exercise: Other - see comments (pickle ball), Time (Minutes): 60, Frequency (Times/Week): 3, Weekly Exercise (Minutes/Week): 180, Intensity: Moderate, Exercise limited by: respiratory conditions(s)  Diet Patient reports consuming 3 meals a day and 3 snack(s) a day Patient reports that his primary diet is: Regular Patient reports that she does have regular access to food.   Depression Screen PHQ 2/9 Scores 10/26/2021 06/30/2020 05/07/2019 05/07/2019 05/03/2019 03/05/2019 11/08/2018  PHQ - 2 Score 0 0 0 0 0 0 0  PHQ- 9 Score - 0 - - 0 0 -     Fall Risk Fall Risk  10/26/2021 06/30/2020 05/07/2019 05/03/2019 03/05/2019  Falls in the past year? 0 0 0 0 0  Follow up - - Falls prevention discussed - -     Objective:  Brent Snow seemed alert and oriented and he participated appropriately during our telephone visit.  Blood Pressure Weight BMI  BP Readings from Last 3 Encounters:  07/06/21 140/73  03/31/21 (!) 158/84  01/20/21 (!) 153/89   Wt Readings from Last 3 Encounters:  07/06/21 200 lb 12.8 oz (91.1 kg)  03/31/21 205 lb (93 kg)  01/14/21 200 lb (90.7 kg)   BMI Readings from Last 1 Encounters:  07/06/21 33.41 kg/m    *Unable to obtain current vital signs, weight, and BMI due to telephone visit type  Hearing/Vision  [place] did not seem to have difficulty with hearing/understanding during the telephone conversation Reports that he has not had a formal eye exam by an eye care professional within the past year Reports that he has not had a formal hearing evaluation within the past year *Unable to fully assess hearing and vision during telephone visit type  Cognitive  Function: 6CIT Screen  10/26/2021 05/07/2019  What Year? 0 points 0 points  What month? 0 points 0 points  What time? 0 points 0 points  Count back from 20 0 points 0 points  Months in reverse 0 points 0 points  Repeat phrase 2 points 2 points  Total Score 2 2   (Normal:0-7, Significant for Dysfunction: >8)  Normal Cognitive Function Screening: Yes   Immunization & Health Maintenance Record Immunization History  Administered Date(s) Administered   Fluad Quad(high Dose 65+) 10/25/2019, 09/01/2020   Influenza, High Dose Seasonal PF 10/18/2018   Moderna Sars-Covid-2 Vaccination 12/18/2019   PFIZER(Purple Top)SARS-COV-2 Vaccination 02/15/2020   Pneumococcal Conjugate-13 10/24/2013   Pneumococcal Polysaccharide-23 12/06/2006, 10/11/2012, 11/03/2016   Td 05/17/2006   Zoster Recombinat (Shingrix) 07/06/2021    Health Maintenance  Topic Date Due   COVID-19 Vaccine (3 - Booster) 04/11/2020   OPHTHALMOLOGY EXAM  05/08/2020   FOOT EXAM  06/30/2021   INFLUENZA VACCINE  07/06/2021   Zoster Vaccines- Shingrix (2 of 2) 08/31/2021   TETANUS/TDAP  07/06/2022 (Originally 05/17/2016)   HEMOGLOBIN A1C  01/06/2022   Pneumonia Vaccine 33+ Years old  Completed   Hepatitis C Screening  Completed   HPV VACCINES  Aged Out       Assessment  This is a routine wellness examination for [employer]  Health Maintenance: Due or Overdue Health Maintenance Due  Topic Date Due   COVID-19 Vaccine (3 - Booster) 04/11/2020   OPHTHALMOLOGY EXAM  05/08/2020   FOOT EXAM  06/30/2021   INFLUENZA VACCINE  07/06/2021   Zoster Vaccines- Shingrix (2 of 2) 08/31/2021    Brent Snow does not need a referral for Community Assistance: Care Management:   no Social Work:    no Prescription Assistance:  no Nutrition/Diabetes Education:  no   Plan:  Personalized Goals  Goals Addressed             This Visit's Progress    DIET - INCREASE WATER INTAKE         Personalized Health  Maintenance & Screening Recommendations  Influenza vaccine Advanced directives: has NO advanced directive - not interested in additional information Shingrix-2nd dose Diabetic Eye Exam  Lung Cancer Screening Recommended: not applicable (Low Dose CT Chest recommended if Age 60-80 years, 30 pack-year currently smoking OR have quit w/in past 15 years) Hepatitis C Screening recommended: no HIV Screening recommended: no  Advanced Directives: Written information was not prepared per patient's request.  Referrals & Orders No orders of the defined types were placed in this encounter.   Follow-up Plan Follow-up with Brent Norlander, DO as planned Schedule your diabetic eye exam as discussed Keep appointment with the Triage Nurse to get your Flu shot   I have personally reviewed and noted the following in the patient's chart:   Medical and social history Use of alcohol, tobacco or illicit drugs  Current medications and supplements Functional ability and status Nutritional status Physical activity Advanced directives List of other physicians Hospitalizations, surgeries, and ER visits in previous 12 months Vitals Screenings to include cognitive, depression, and falls Referrals and appointments  In addition, I have reviewed and discussed with Brent Snow certain preventive protocols, quality metrics, and best practice recommendations. A written personalized care plan for preventive services as well as general preventive health recommendations is available and can be mailed to the patient at his request.      Milas Hock, LPN  [id number]

## 2021-11-04 ENCOUNTER — Ambulatory Visit: Payer: Medicare Other

## 2021-11-04 ENCOUNTER — Encounter: Payer: Self-pay | Admitting: Family Medicine

## 2021-11-04 ENCOUNTER — Ambulatory Visit (INDEPENDENT_AMBULATORY_CARE_PROVIDER_SITE_OTHER): Payer: Medicare Other

## 2021-11-04 ENCOUNTER — Ambulatory Visit (INDEPENDENT_AMBULATORY_CARE_PROVIDER_SITE_OTHER): Payer: Medicare Other | Admitting: Family Medicine

## 2021-11-04 VITALS — BP 180/88 | HR 78 | Temp 98.1°F | Ht 66.0 in | Wt 207.0 lb

## 2021-11-04 DIAGNOSIS — W19XXXA Unspecified fall, initial encounter: Secondary | ICD-10-CM

## 2021-11-04 DIAGNOSIS — S81812A Laceration without foreign body, left lower leg, initial encounter: Secondary | ICD-10-CM

## 2021-11-04 DIAGNOSIS — Z23 Encounter for immunization: Secondary | ICD-10-CM | POA: Diagnosis not present

## 2021-11-04 DIAGNOSIS — M25512 Pain in left shoulder: Secondary | ICD-10-CM | POA: Diagnosis not present

## 2021-11-04 MED ORDER — NAPROXEN 500 MG PO TABS: 500.0000 mg | ORAL_TABLET | Freq: Two times a day (BID) | ORAL | 0 refills | Status: AC

## 2021-11-04 NOTE — Progress Notes (Signed)
Subjective:  Patient ID: Brent Snow, male    DOB: Jan 22, 1941, 80 y.o.   MRN: [id number]  Patient Care Team: Janora Norlander, DO as PCP - General (Family Medicine) Collene Gobble, MD as Consulting Physician (Pulmonary Disease) Suella Broad, MD as Consulting Physician (Physical Medicine and Rehabilitation)   Chief Complaint:  Fall and Shoulder Injury   HPI: RICE Brent Snow is a 80 y.o. male presenting on 11/04/2021 for Fall and Shoulder Injury   Pt was putting out Christmas decorations and stepped back to far and fell from the porch to the ground. He landed on left shoulder. He denies LOC. Has pain in left shoulder and left shin.   Fall The accident occurred 1 to 3 hours ago. The fall occurred while standing. He fell from a height of 3 to 5 ft. He landed on [place]. The volume of blood lost was minimal. The point of impact was the left shoulder. The pain is present in the left shoulder. The pain is at a severity of 5/10. The pain is moderate. The symptoms are aggravated by flexion, movement, rotation, pressure on injury and use of injured limb. Pertinent negatives include no abdominal pain, bowel incontinence, fever, headaches, hearing loss, hematuria, loss of consciousness, nausea, numbness, tingling, visual change or vomiting. He has tried acetaminophen for the symptoms. The treatment provided mild relief.  Shoulder Injury  The incident occurred at home. The incident occurred 1 to 3 hours ago. The injury mechanism was a fall. The quality of the pain is described as aching, burning and shooting. The pain radiates to the left arm. The pain is at a severity of 5/10. The pain is moderate. Pertinent negatives include no chest pain, muscle weakness, numbness or tingling. The symptoms are aggravated by movement, overhead lifting and palpation. He has tried acetaminophen for the symptoms. The treatment provided mild relief.     Relevant past medical, surgical, family, and  social history reviewed and updated as indicated.  Allergies and medications reviewed and updated. Data reviewed: Chart in Epic.   Past Medical History:  Diagnosis Date   Arthritis    COPD, severe ([place])    pulmologist-  dr byrum   Dyspnea on exertion    prescribed to use O2 via [place]--- per pt he does not use O2 he just take 1-2 puffs of rescue inhaler   Hiatal hernia    History of acute respiratory failure    02-18-2007  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   Hypertension    Hypoxemic respiratory failure, chronic ([facility])    pulmologist-  dr byrum-- prescribed supplemental O2 @ 2L via [place]    OSA on CPAP    and added O2 @ 2L via  w/ cpap   Phimosis    S/P balloon dilatation of esophageal stricture    multiple    Type 2 diabetes mellitus ([place])    followed by pcp--  per note uncontrolled w/ last A1c >14 on 12-30-2016   Wears contact lenses     Past Surgical History:  Procedure Laterality Date   BACK SURGERY     CIRCUMCISION N/A 01/27/2017   Procedure: CIRCUMCISION ADULT;  Surgeon: Franchot Gallo, MD;  Location: [facility];  Service: Urology;  Laterality: N/A;   KNEE ARTHROSCOPY Right 2008   LUMBAR SPINE SURGERY  1977  approx.   TONSILLECTOMY AND ADENOIDECTOMY  age 11   TRANSTHORACIC ECHOCARDIOGRAM  09/12/2015   mild LVH,  ef 65-70%,  grade 1 diastolic dysfunction/  mild RAE    Social History   Socioeconomic History   Marital status: Married    Spouse name: Not on file   Number of children: 2   Years of education: 15   Highest education level: Some college, no degree  Occupational History   Occupation: Retired    Comment: Secondary school teacher  Tobacco Use   Smoking status: Former    Packs/day: 2.00    Years: 40.00    Pack years: 80.00    Types: Cigarettes    Quit date: 12/06/2006    Years since quitting: 14.9   Smokeless tobacco: Never  Vaping Use   Vaping Use: Never used  Substance and Sexual Activity   Alcohol use: Yes     Alcohol/week: 0.0 standard drinks    Comment: VERY LITTLE   Drug use: No   Sexual activity: Not on file  Other Topics Concern   Not on file  Social History Narrative   Not on file   Social Determinants of Health   Financial Resource Strain: Low Risk    Difficulty of Paying Living Expenses: Not hard at all  Food Insecurity: No Food Insecurity   Worried About Charity fundraiser in the Last Year: Never true   Ran Out of Food in the Last Year: Never true  Transportation Needs: No Transportation Needs   Lack of Transportation (Medical): No   Lack of Transportation (Non-Medical): No  Physical Activity: Sufficiently Active   Days of Exercise per Week: 3 days   Minutes of Exercise per Session: 60 min  Stress: No Stress Concern Present   Feeling of Stress : Not at all  Social Connections: Socially Integrated   Frequency of Communication with Friends and Family: More than three times a week   Frequency of Social Gatherings with Friends and Family: More than three times a week   Attends Religious Services: More than 4 times per year   Active Member of [employer] or Organizations: Yes   Attends Archivist Meetings: More than 4 times per year   Marital Status: Married  Human resources officer Violence: Not on file    Outpatient Encounter Medications as of 11/04/2021  Medication Sig   albuterol (PROAIR HFA) 108 (90 Base) MCG/ACT inhaler Inhale 2 puffs into the lungs every 4 (four) hours as needed.   Dulaglutide (TRULICITY) 1.5 VE/7.2CN SOPN Inject 1.5 mg into the skin once a week.   Fluticasone-Umeclidin-Vilant (TRELEGY ELLIPTA) 100-62.5-25 MCG/INH AEPB USE 1 INHALATION ORALLY    DAILY   glucose blood test strip Check BS QD and PRN   Ibuprofen 200 MG CAPS Take 2 capsules by mouth as needed.    Lancets (ONETOUCH ULTRASOFT) lancets    lisinopril (ZESTRIL) 20 MG tablet Take 1 tablet (20 mg total) by mouth daily.   metFORMIN (GLUCOPHAGE) 500 MG tablet Take 1 tablet (500 mg total) by mouth  daily with breakfast. TAKE 2 TABLETS BY MOUTH TWICE DAILY WITH A MEAL   naproxen (NAPROSYN) 500 MG tablet Take 1 tablet (500 mg total) by mouth 2 (two) times daily with a meal for 14 days.   pravastatin (PRAVACHOL) 40 MG tablet Take 1 tablet (40 mg total) by mouth daily.   tamsulosin (FLOMAX) 0.4 MG CAPS capsule Take 1 capsule (0.4 mg total) by mouth daily after supper.   [DISCONTINUED] sildenafil (VIAGRA) 100 MG tablet Take 1 tablet (100 mg total) by mouth daily as needed for erectile dysfunction. (Patient not taking: Reported  on 10/26/2021)   No facility-administered encounter medications on file as of 11/04/2021.    No Known Allergies  Review of Systems  Constitutional:  Negative for appetite change, chills, diaphoresis, fatigue, fever and unexpected weight change.  HENT: Negative.    Eyes: Negative.   Respiratory:  Negative for cough, chest tightness and shortness of breath.   Cardiovascular:  Negative for chest pain, palpitations and leg swelling.  Gastrointestinal:  Negative for abdominal pain, blood in stool, bowel incontinence, constipation, diarrhea, nausea and vomiting.  Endocrine: Negative.   Genitourinary:  Negative for dysuria, frequency, hematuria and urgency.  Musculoskeletal:  Positive for arthralgias and joint swelling. Negative for back pain, gait problem, myalgias, neck pain and neck stiffness.  Skin:  Positive for wound.  Allergic/Immunologic: Negative.   Neurological:  Negative for dizziness, tingling, tremors, seizures, loss of consciousness, syncope, facial asymmetry, speech difficulty, weakness, light-headedness, numbness and headaches.  Hematological: Negative.   Psychiatric/Behavioral:  Negative for confusion, hallucinations, sleep disturbance and suicidal ideas.   All other systems reviewed and are negative.      Objective:  BP (!) 180/88   Pulse 78   Temp 98.1 F (36.7 C)   Ht '5\' 6"'  (1.676 m)   Wt 207 lb (93.9 kg)   SpO2 95%   BMI 33.41 kg/m    Wt  Readings from Last 3 Encounters:  11/04/21 207 lb (93.9 kg)  07/06/21 200 lb 12.8 oz (91.1 kg)  03/31/21 205 lb (93 kg)    Physical Exam Vitals and nursing note reviewed.  Constitutional:      General: He is not in acute distress.    Appearance: Normal appearance. He is well-developed and well-groomed. He is obese. He is not ill-appearing, toxic-appearing or diaphoretic.  HENT:     Head: Normocephalic and atraumatic.     Jaw: There is normal jaw occlusion.     Right Ear: Hearing normal.     Left Ear: Hearing normal.     Nose: Nose normal.     Mouth/Throat:     Lips: Pink.     Mouth: Mucous membranes are moist.     Pharynx: Oropharynx is clear. Uvula midline.  Eyes:     General: Lids are normal.     Extraocular Movements: Extraocular movements intact.     Conjunctiva/sclera: Conjunctivae normal.     Pupils: Pupils are equal, round, and reactive to light.  Neck:     Thyroid: No thyroid mass, thyromegaly or thyroid tenderness.     Vascular: No carotid bruit or JVD.     Trachea: Trachea and phonation normal.  Cardiovascular:     Rate and Rhythm: Normal rate and regular rhythm.     Chest Wall: PMI is not displaced.     Pulses: Normal pulses.     Heart sounds: Normal heart sounds. No murmur heard.   No friction rub. No gallop.  Pulmonary:     Effort: Pulmonary effort is normal. No respiratory distress.     Breath sounds: Normal breath sounds. No wheezing.  Abdominal:     General: Abdomen is protuberant. Bowel sounds are normal. There is no abdominal bruit.     Palpations: Abdomen is soft. There is no hepatomegaly or splenomegaly.  Musculoskeletal:     Right shoulder: Normal.     Left shoulder: Swelling and tenderness present. No deformity, effusion, laceration or crepitus. Decreased range of motion. Decreased strength. Normal pulse.     Right upper arm: Normal.     Left upper arm:  Normal.       Arms:     Cervical back: Normal, normal range of motion and neck supple.      Right lower leg: No edema.     Left lower leg: No edema.  Lymphadenopathy:     Cervical: No cervical adenopathy.  Skin:    General: Skin is warm and dry.     Capillary Refill: Capillary refill takes less than 2 seconds.     Coloration: Skin is not cyanotic, jaundiced or pale.     Findings: Wound present. No rash.       Neurological:     General: No focal deficit present.     Mental Status: He is alert and oriented to person, place, and time.     Sensory: Sensation is intact.     Motor: Motor function is intact.     Coordination: Coordination is intact.     Gait: Gait is intact.     Deep Tendon Reflexes: Reflexes are normal and symmetric.  Psychiatric:        Attention and Perception: Attention and perception normal.        Mood and Affect: Mood and affect normal.        Speech: Speech normal.        Behavior: Behavior normal. Behavior is cooperative.        Thought Content: Thought content normal.        Cognition and Memory: Cognition and memory normal.        Judgment: Judgment normal.    Results for orders placed or performed in visit on 07/09/21  [facility]  Result Value Ref Range   Glucose 130 (H) 65 - 99 mg/dL   BUN 16 8 - 27 mg/dL   Creatinine, Ser 0.94 0.76 - 1.27 mg/dL   eGFR 83 >59 mL/min/1.73   BUN/Creatinine Ratio 17 10 - 24   Sodium 139 134 - 144 mmol/L   Potassium 4.7 3.5 - 5.2 mmol/L   Chloride 101 96 - 106 mmol/L   CO2 24 20 - 29 mmol/L   Calcium 9.4 8.6 - 10.2 mg/dL  Lipid panel  Result Value Ref Range   Cholesterol, Total 112 100 - 199 mg/dL   Triglycerides 76 0 - 149 mg/dL   HDL 44 >39 mg/dL   VLDL Cholesterol Cal 16 5 - 40 mg/dL   LDL Chol Calc (NIH) 52 0 - 99 mg/dL   Chol/HDL Ratio 2.5 0.0 - 5.0 ratio     X-Ray: left shoulder: probable AC joint separation, no fracture noted. Compared to previous CXR with noted widening of AC joint. Preliminary x-ray reading by Monia Pouch, FNP-C, [facility].   Pertinent labs & imaging results that were available  during my care of the patient were reviewed by me and considered in my medical decision making.  Assessment & Plan:  [place] was seen today for fall and shoulder injury.  Diagnoses and all orders for this visit:  Fall, initial encounter Acute pain of left shoulder Probable AC joint separation without fracture noted on imaging, will notify pt if radiology reading differs. Sling provided in office. Last renal function normal so will provide with short burst of NSAIDs. Home rehab exercises discussed in detail and handout provided. Pt aware to report any new, worsening, or persistent symptoms.  -     DG Shoulder Left -     naproxen (NAPROSYN) 500 MG tablet; Take 1 tablet (500 mg total) by mouth 2 (two) times daily with a meal for  14 days.  Skin tear of left lower leg without complication, initial encounter Wound care discussed in detail. Report any new or worsening symptoms.   Influenza vaccine given today.   Continue all other maintenance medications.  Follow up plan: Return in about 4 weeks (around 12/02/2021), or if symptoms worsen or fail to improve, for shoulder pain.   Continue healthy lifestyle choices, including diet (rich in fruits, vegetables, and lean proteins, and low in salt and simple carbohydrates) and exercise (at least 30 minutes of moderate physical activity daily).  Educational handout given for shoulder pain, AC separation rehab, skin tear  The above assessment and management plan was discussed with the patient. The patient verbalized understanding of and has agreed to the management plan. Patient is aware to call the clinic if they develop any new symptoms or if symptoms persist or worsen. Patient is aware when to return to the clinic for a follow-up visit. Patient educated on when it is appropriate to go to the emergency department.   Monia Pouch, FNP-C [facility] [phone]

## 2021-11-06 DIAGNOSIS — E119 Type 2 diabetes mellitus without complications: Secondary | ICD-10-CM | POA: Diagnosis not present

## 2021-11-06 LAB — HM DIABETES EYE EXAM

## 2021-11-09 ENCOUNTER — Encounter: Payer: Self-pay | Admitting: Family

## 2021-11-09 ENCOUNTER — Telehealth: Payer: Self-pay | Admitting: Family Medicine

## 2021-11-09 ENCOUNTER — Ambulatory Visit (INDEPENDENT_AMBULATORY_CARE_PROVIDER_SITE_OTHER): Payer: Medicare Other | Admitting: Family

## 2021-11-09 DIAGNOSIS — S43102D Unspecified dislocation of left acromioclavicular joint, subsequent encounter: Secondary | ICD-10-CM | POA: Diagnosis not present

## 2021-11-09 DIAGNOSIS — M25512 Pain in left shoulder: Secondary | ICD-10-CM

## 2021-11-09 MED ORDER — TRAMADOL HCL 50 MG PO TABS: 50.0000 mg | ORAL_TABLET | Freq: Three times a day (TID) | ORAL | 0 refills | Status: AC | PRN

## 2021-11-09 NOTE — Progress Notes (Signed)
Virtual Visit  Note Due to COVID-19 pandemic this visit was conducted virtually. This visit type was conducted due to national recommendations for restrictions regarding the COVID-19 Pandemic (e.g. social distancing, sheltering in place) in an effort to limit this patient's exposure and mitigate transmission in our community. All issues noted in this document were discussed and addressed.  A physical exam was not performed with this format.  I connected with Brent Snow on 11/09/21 at 2:04 pm  by telephone and verified that I am speaking with the correct person using two identifiers. Brent Snow is currently located at home and his wife is currently with him  during visit. The provider, Evelina Dun, FNP is located in their office at time of visit.  I discussed the limitations, risks, security and privacy concerns of performing an evaluation and management service by telephone and the availability of in person appointments. I also discussed with the patient that there may be a patient responsible charge related to this service. The patient expressed understanding and agreed to proceed.  Mr. harden, bramer are scheduled for a virtual visit with your provider today.    Just as we do with appointments in the office, we must obtain your consent to participate.  Your consent will be active for this visit and any virtual visit you may have with one of our providers in the next 365 days.    If you have a MyChart account, I can also send a copy of this consent to you electronically.  All virtual visits are billed to your insurance company just like a traditional visit in the office.  As this is a virtual visit, video technology does not allow for your provider to perform a traditional examination.  This may limit your provider's ability to fully assess your condition.  If your provider identifies any concerns that need to be evaluated in person or the need to arrange testing such as labs, EKG,  etc, we will make arrangements to do so.    Although advances in technology are sophisticated, we cannot ensure that it will always work on either your end or our end.  If the connection with a video visit is poor, we may have to switch to a telephone visit.  With either a video or telephone visit, we are not always able to ensure that we have a secure connection.   I need to obtain your verbal consent now.   Are you willing to proceed with your visit today?   Brent Snow has provided verbal consent on 11/09/2021 for a virtual visit (video or telephone).   Evelina Dun, [place] 11/09/2021  2:16 PM    History and Present Illness:  PT calls the office today with recurrent left shoulder pain after a fall. He had a x-ray that showed, "Mild widening of the acromioclavicular joint. This may represent mild separation." He was given naprosyn with mild relief.  Shoulder Pain  The pain is present in the left shoulder. This is a new problem. The current episode started 1 to 4 weeks ago. The quality of the pain is described as aching. The pain is at a severity of 8/10. Associated symptoms include a limited range of motion. The symptoms are aggravated by activity. He has tried NSAIDS, rest, heat and cold (sling) for the symptoms. The treatment provided mild relief.     ROS   Observations/Objective: No SOB or distress, states he can not lift his arm above his shoulders  Assessment and Plan:  1. Acute pain of left shoulder - traMADol (ULTRAM) 50 MG tablet; Take 1 tablet (50 mg total) by mouth every 8 (eight) hours as needed for up to 5 days.  Dispense: 20 tablet; Refill: 0 - Ambulatory referral to Orthopedic Surgery  2. Separation of left acromioclavicular joint, subsequent encounter - traMADol (ULTRAM) 50 MG tablet; Take 1 tablet (50 mg total) by mouth every 8 (eight) hours as needed for up to 5 days.  Dispense: 20 tablet; Refill: 0 - Ambulatory referral to Orthopedic Surgery  Rest ROM  exercises encouraged Will do referral to Ortho, if pain improves he can cancel Ortho appt  Continue NSAID's   I discussed the assessment and treatment plan with the patient. The patient was provided an opportunity to ask questions and all were answered. The patient agreed with the plan and demonstrated an understanding of the instructions.   The patient was advised to call back or seek an in-person evaluation if the symptoms worsen or if the condition fails to improve as anticipated.  The above assessment and management plan was discussed with the patient. The patient verbalized understanding of and has agreed to the management plan. Patient is aware to call the clinic if symptoms persist or worsen. Patient is aware when to return to the clinic for a follow-up visit. Patient educated on when it is appropriate to go to the emergency department.   Time call ended:  2:26 pm   I provided 12 minutes of  non face-to-face time during this encounter.    Evelina Dun, FNP

## 2021-11-09 NOTE — Telephone Encounter (Signed)
To give pain medication, it needs a visit.  Evelina Dun, FNP

## 2021-11-16 ENCOUNTER — Ambulatory Visit (INDEPENDENT_AMBULATORY_CARE_PROVIDER_SITE_OTHER): Payer: Medicare Other | Admitting: Orthopedic Surgery

## 2021-11-16 ENCOUNTER — Encounter: Payer: Self-pay | Admitting: Orthopedic Surgery

## 2021-11-16 DIAGNOSIS — S43102A Unspecified dislocation of left acromioclavicular joint, initial encounter: Secondary | ICD-10-CM

## 2021-11-16 MED ORDER — TRAMADOL HCL 50 MG PO TABS: 50.0000 mg | ORAL_TABLET | Freq: Four times a day (QID) | ORAL | 5 refills | Status: DC | PRN

## 2021-11-16 MED ORDER — CYCLOBENZAPRINE HCL 10 MG PO TABS: 10.0000 mg | ORAL_TABLET | Freq: Two times a day (BID) | ORAL | 0 refills | Status: DC | PRN

## 2021-11-16 NOTE — Progress Notes (Signed)
New Patient Visit  Assessment: Brent Snow is a 80 y.o. male with the following: 1. AC joint dislocation, left, initial encounter  Plan: Radiographs demonstrate mild displacement of the clavicle, in relation to the acromion.  Pain is overlying the left AC joint.  Consistent with a grade 1-2 sprain of the [facility] joint.  Anticipate that this will continue to improve with time.  Given the mechanism, it is possible that he has sustained further injury, which might include a rotator cuff issue.  It is difficult to tell at this point.  We will continue to monitor.  He should remain in the sling, especially if he is upright.  If he is resting, it is okay to remove the sling.  I am comfortable with him increasing his range of motion, but advised him not to do much lifting the left arm, until he is evaluated in approximately 1 month.  I provided him with a an updated prescription of tramadol, as well as some Flexeril to help with pain.   Follow-up: Return in about 4 weeks (around 12/14/2021).  Subjective:  Chief Complaint  Patient presents with   Shoulder Pain    LT shoulder DOI 10/12/21 pt fell    History of Present Illness: Brent Snow is a 80 y.o. male who has been referred to clinic today by Ronnie Doss, DO for evaluation of left shoulder pain.  He was putting up some Christmas lights, just less than 2 weeks ago, when he lost his balance and fell backwards.  He is unsure how he landed.  He noted immediate pain in the left shoulder.  He had a lot of bruising over the chest, rib cage, as well as the left arm.  This has gradually improved.  His pain is gradually improved.  He does note worsening at night.  It is difficult to sleep.  He has been wearing the sling, and this is providing some comfort.  His range of motion is gradually improving.  No numbness or tingling.   Review of Systems: No fevers or chills No numbness or tingling No chest pain No shortness of breath No bowel or  bladder dysfunction No GI distress No headaches   Medical History:  Past Medical History:  Diagnosis Date   Arthritis    COPD, severe ([place])    pulmologist-  dr byrum   Dyspnea on exertion    prescribed to use O2 via [place]--- per pt he does not use O2 he just take 1-2 puffs of rescue inhaler   Hiatal hernia    History of acute respiratory failure    02-18-2007  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   Hypertension    Hypoxemic respiratory failure, chronic ([place])    pulmologist-  dr byrum-- prescribed supplemental O2 @ 2L via [place]    OSA on CPAP    and added O2 @ 2L via [place] w/ cpap   Phimosis    S/P balloon dilatation of esophageal stricture    multiple    Type 2 diabetes mellitus ([place])    followed by pcp--  per note uncontrolled w/ last A1c >14 on 12-30-2016   Wears contact lenses     Past Surgical History:  Procedure Laterality Date   BACK SURGERY     CIRCUMCISION N/A 01/27/2017   Procedure: CIRCUMCISION ADULT;  Surgeon: Franchot Gallo, MD;  Location: [facility];  Service: Urology;  Laterality: N/A;   KNEE ARTHROSCOPY Right 2008   LUMBAR SPINE  SURGERY  1977  approx.   TONSILLECTOMY AND ADENOIDECTOMY  age 42   TRANSTHORACIC ECHOCARDIOGRAM  09/12/2015   mild LVH,  ef 65-70%,  grade 1 diastolic dysfunction/  mild RAE    Family History  Problem Relation Age of Onset   Asthma Mother    COPD Mother    Cancer Maternal Grandmother        stomach   Early death Father 39       Tractor Accident   Social History   Tobacco Use   Smoking status: Former    Packs/day: 2.00    Years: 40.00    Pack years: 80.00    Types: Cigarettes    Quit date: 12/06/2006    Years since quitting: 14.9   Smokeless tobacco: Never  Vaping Use   Vaping Use: Never used  Substance Use Topics   Alcohol use: Yes    Alcohol/week: 0.0 standard drinks    Comment: VERY LITTLE   Drug use: No    No Known Allergies  Current Meds  Medication Sig   albuterol (PROAIR HFA) 108  (90 Base) MCG/ACT inhaler Inhale 2 puffs into the lungs every 4 (four) hours as needed.   cyclobenzaprine (FLEXERIL) 10 MG tablet Take 1 tablet (10 mg total) by mouth 2 (two) times daily as needed for muscle spasms.   Dulaglutide (TRULICITY) 1.5 KN/3.9JQ SOPN Inject 1.5 mg into the skin once a week.   Fluticasone-Umeclidin-Vilant (TRELEGY ELLIPTA) 100-62.5-25 MCG/INH AEPB USE 1 INHALATION ORALLY    DAILY   glucose blood test strip Check BS QD and PRN   Lancets (ONETOUCH ULTRASOFT) lancets    lisinopril (ZESTRIL) 20 MG tablet Take 1 tablet (20 mg total) by mouth daily.   metFORMIN (GLUCOPHAGE) 500 MG tablet Take 1 tablet (500 mg total) by mouth daily with breakfast. TAKE 2 TABLETS BY MOUTH TWICE DAILY WITH A MEAL   naproxen (NAPROSYN) 500 MG tablet Take 1 tablet (500 mg total) by mouth 2 (two) times daily with a meal for 14 days.   pravastatin (PRAVACHOL) 40 MG tablet Take 1 tablet (40 mg total) by mouth daily.   tamsulosin (FLOMAX) 0.4 MG CAPS capsule Take 1 capsule (0.4 mg total) by mouth daily after supper.   traMADol (ULTRAM) 50 MG tablet Take 1 tablet (50 mg total) by mouth every 6 (six) hours as needed.    Objective: There were no vitals taken for this visit.  Physical Exam:  General: Elderly male., Alert and oriented., and No acute distress. Gait: Normal gait.  Evaluation left shoulder demonstrates residual swelling and ecchymosis.  Tenderness palpation directly overlying the AC joint.  He tolerates forward flexion to 90 degrees.  Abduction to 90 degrees.  Mild tenderness to palpation of the lateral shoulder.  Fingers warm and well-perfused.  2+ radial pulse.  Sensation is intact throughout the left hand.  IMAGING: I personally reviewed images previously obtained in clinic  X-ray left shoulder was previously obtained.  There is very mild displacement of the clavicle in relation to the acromion.  Glenohumeral joint is reduced.  No further injuries noted.  New Medications:  Meds  ordered this encounter  Medications   traMADol (ULTRAM) 50 MG tablet    Sig: Take 1 tablet (50 mg total) by mouth every 6 (six) hours as needed.    Dispense:  20 tablet    Refill:  5   cyclobenzaprine (FLEXERIL) 10 MG tablet    Sig: Take 1 tablet (10 mg total) by mouth 2 (two)  times daily as needed for muscle spasms.    Dispense:  20 tablet    Refill:  0      Mordecai Rasmussen, MD  11/16/2021 10:48 PM

## 2021-11-20 ENCOUNTER — Encounter: Payer: Self-pay | Admitting: Orthopedic Surgery

## 2021-11-24 DIAGNOSIS — N400 Enlarged prostate without lower urinary tract symptoms: Secondary | ICD-10-CM | POA: Diagnosis not present

## 2021-11-24 DIAGNOSIS — K219 Gastro-esophageal reflux disease without esophagitis: Secondary | ICD-10-CM | POA: Diagnosis not present

## 2021-11-24 DIAGNOSIS — R569 Unspecified convulsions: Secondary | ICD-10-CM | POA: Diagnosis not present

## 2021-11-24 DIAGNOSIS — E119 Type 2 diabetes mellitus without complications: Secondary | ICD-10-CM | POA: Diagnosis not present

## 2021-11-24 DIAGNOSIS — E1165 Type 2 diabetes mellitus with hyperglycemia: Secondary | ICD-10-CM | POA: Diagnosis not present

## 2021-11-24 DIAGNOSIS — J9811 Atelectasis: Secondary | ICD-10-CM | POA: Diagnosis not present

## 2021-11-24 DIAGNOSIS — S065X0A Traumatic subdural hemorrhage without loss of consciousness, initial encounter: Secondary | ICD-10-CM | POA: Diagnosis not present

## 2021-11-24 DIAGNOSIS — S4352XA Sprain of left acromioclavicular joint, initial encounter: Secondary | ICD-10-CM | POA: Diagnosis not present

## 2021-11-24 DIAGNOSIS — G4733 Obstructive sleep apnea (adult) (pediatric): Secondary | ICD-10-CM | POA: Diagnosis not present

## 2021-11-24 DIAGNOSIS — R0602 Shortness of breath: Secondary | ICD-10-CM | POA: Diagnosis not present

## 2021-11-24 DIAGNOSIS — R410 Disorientation, unspecified: Secondary | ICD-10-CM | POA: Diagnosis not present

## 2021-11-24 DIAGNOSIS — S065XAA Traumatic subdural hemorrhage with loss of consciousness status unknown, initial encounter: Secondary | ICD-10-CM | POA: Diagnosis not present

## 2021-11-24 DIAGNOSIS — G40901 Epilepsy, unspecified, not intractable, with status epilepticus: Secondary | ICD-10-CM | POA: Diagnosis not present

## 2021-11-24 DIAGNOSIS — J449 Chronic obstructive pulmonary disease, unspecified: Secondary | ICD-10-CM | POA: Diagnosis not present

## 2021-11-24 DIAGNOSIS — I10 Essential (primary) hypertension: Secondary | ICD-10-CM | POA: Diagnosis not present

## 2021-11-24 DIAGNOSIS — R531 Weakness: Secondary | ICD-10-CM | POA: Diagnosis not present

## 2021-11-24 DIAGNOSIS — I700 Atherosclerosis of aorta: Secondary | ICD-10-CM | POA: Diagnosis not present

## 2021-11-24 DIAGNOSIS — R0902 Hypoxemia: Secondary | ICD-10-CM | POA: Diagnosis not present

## 2021-11-24 DIAGNOSIS — J90 Pleural effusion, not elsewhere classified: Secondary | ICD-10-CM | POA: Diagnosis not present

## 2021-11-24 DIAGNOSIS — I359 Nonrheumatic aortic valve disorder, unspecified: Secondary | ICD-10-CM | POA: Diagnosis not present

## 2021-11-24 DIAGNOSIS — E785 Hyperlipidemia, unspecified: Secondary | ICD-10-CM | POA: Diagnosis not present

## 2021-11-24 DIAGNOSIS — Z9989 Dependence on other enabling machines and devices: Secondary | ICD-10-CM | POA: Diagnosis not present

## 2021-11-24 DIAGNOSIS — R404 Transient alteration of awareness: Secondary | ICD-10-CM | POA: Diagnosis not present

## 2021-11-24 DIAGNOSIS — J439 Emphysema, unspecified: Secondary | ICD-10-CM | POA: Diagnosis not present

## 2021-11-24 DIAGNOSIS — Z79899 Other long term (current) drug therapy: Secondary | ICD-10-CM | POA: Diagnosis not present

## 2021-11-24 DIAGNOSIS — I6523 Occlusion and stenosis of bilateral carotid arteries: Secondary | ICD-10-CM | POA: Diagnosis not present

## 2021-11-24 DIAGNOSIS — E162 Hypoglycemia, unspecified: Secondary | ICD-10-CM | POA: Diagnosis not present

## 2021-11-24 DIAGNOSIS — Z7984 Long term (current) use of oral hypoglycemic drugs: Secondary | ICD-10-CM | POA: Diagnosis not present

## 2021-11-24 DIAGNOSIS — R2689 Other abnormalities of gait and mobility: Secondary | ICD-10-CM | POA: Diagnosis not present

## 2021-11-24 DIAGNOSIS — R4020 Unspecified coma: Secondary | ICD-10-CM | POA: Diagnosis not present

## 2021-11-24 DIAGNOSIS — Z87891 Personal history of nicotine dependence: Secondary | ICD-10-CM | POA: Diagnosis not present

## 2021-11-24 DIAGNOSIS — G40409 Other generalized epilepsy and epileptic syndromes, not intractable, without status epilepticus: Secondary | ICD-10-CM | POA: Diagnosis not present

## 2021-11-24 DIAGNOSIS — R2681 Unsteadiness on feet: Secondary | ICD-10-CM | POA: Diagnosis not present

## 2021-11-24 DIAGNOSIS — I6200 Nontraumatic subdural hemorrhage, unspecified: Secondary | ICD-10-CM | POA: Diagnosis not present

## 2021-11-24 DIAGNOSIS — G9340 Encephalopathy, unspecified: Secondary | ICD-10-CM | POA: Diagnosis not present

## 2021-11-24 DIAGNOSIS — R918 Other nonspecific abnormal finding of lung field: Secondary | ICD-10-CM | POA: Diagnosis not present

## 2021-11-24 DIAGNOSIS — G4089 Other seizures: Secondary | ICD-10-CM | POA: Diagnosis not present

## 2021-11-24 DIAGNOSIS — I350 Nonrheumatic aortic (valve) stenosis: Secondary | ICD-10-CM | POA: Diagnosis not present

## 2021-11-24 DIAGNOSIS — Z7985 Long-term (current) use of injectable non-insulin antidiabetic drugs: Secondary | ICD-10-CM | POA: Diagnosis not present

## 2021-11-24 DIAGNOSIS — S06A0XA Traumatic brain compression without herniation, initial encounter: Secondary | ICD-10-CM | POA: Diagnosis not present

## 2021-11-24 DIAGNOSIS — Z9181 History of falling: Secondary | ICD-10-CM | POA: Diagnosis not present

## 2021-11-25 ENCOUNTER — Encounter: Payer: Self-pay | Admitting: Family Medicine

## 2021-11-28 ENCOUNTER — Telehealth: Payer: Medicare Other

## 2021-11-28 DIAGNOSIS — R339 Retention of urine, unspecified: Secondary | ICD-10-CM | POA: Diagnosis not present

## 2021-11-28 DIAGNOSIS — Z87891 Personal history of nicotine dependence: Secondary | ICD-10-CM | POA: Diagnosis not present

## 2021-12-01 ENCOUNTER — Telehealth: Payer: Self-pay | Admitting: Family Medicine

## 2021-12-01 ENCOUNTER — Telehealth: Payer: Self-pay

## 2021-12-01 NOTE — Telephone Encounter (Signed)
Per wife patient has been on flomax 1 tablet daily. Reviewed with Dr. Diona Fanti- patient will increased to Flomax BID  Wife voiced understanding of medication increase. Appt scheduled for next week for possible voiding trial.

## 2021-12-01 NOTE — Telephone Encounter (Signed)
-----   Message from Franchot Gallo, MD sent at 12/01/2021 12:09 PM EST ----- Regarding: RE: please advise Catheter was replaced on the 24th.  I do not see where they put him on tamsulosin to go home with.  He should be on that medication, he has required it in the past with retention.  I would start him on that, and then have him come back within the next 7 to 10 days for trial of voiding. ----- Message ----- From: Dorisann Frames, RN Sent: 12/01/2021  11:20 AM EST To: Franchot Gallo, MD Subject: please advise                                  Dr. Diona Fanti,  Can you review patient recent hospital occurences. Patient recently had [place] mal seizure that resulted in a fall causing a brain bleed. Had catheter removed and then back to ER for retention again.  What is timeline with his recent history concerning the catheter and appt?

## 2021-12-01 NOTE — Telephone Encounter (Signed)
Patient recently discharged from [facility] after grand mal seizure and subdural hematoma.  ER visit to [facility] after discharge because patient was unable to urinate.  Catheter was placed and at ER and still in place.  ER follow up scheduled with Monia Pouch 12/03/21.  Dr. Lajuana Ripple unavailable until next week and family did not want to wait that long.

## 2021-12-01 NOTE — Telephone Encounter (Signed)
Please call pts wife to schedule hospital follow up appt with Dr Lajuana Ripple. Pt was admitted to [facility] in [place] on 12/20 for seizures and bleed on brain. Pt has been discharged.

## 2021-12-02 ENCOUNTER — Ambulatory Visit: Payer: Medicare Other | Admitting: Emergency Medicine

## 2021-12-03 ENCOUNTER — Ambulatory Visit (INDEPENDENT_AMBULATORY_CARE_PROVIDER_SITE_OTHER): Payer: Medicare Other | Admitting: Family Medicine

## 2021-12-03 ENCOUNTER — Encounter: Payer: Self-pay | Admitting: Family Medicine

## 2021-12-03 VITALS — BP 142/84 | HR 84 | Temp 98.1°F | Ht 66.0 in | Wt 205.0 lb

## 2021-12-03 DIAGNOSIS — G40909 Epilepsy, unspecified, not intractable, without status epilepticus: Secondary | ICD-10-CM | POA: Diagnosis not present

## 2021-12-03 DIAGNOSIS — R918 Other nonspecific abnormal finding of lung field: Secondary | ICD-10-CM | POA: Insufficient documentation

## 2021-12-03 DIAGNOSIS — I6203 Nontraumatic chronic subdural hemorrhage: Secondary | ICD-10-CM | POA: Diagnosis not present

## 2021-12-03 DIAGNOSIS — R911 Solitary pulmonary nodule: Secondary | ICD-10-CM | POA: Diagnosis not present

## 2021-12-03 DIAGNOSIS — E278 Other specified disorders of adrenal gland: Secondary | ICD-10-CM

## 2021-12-03 DIAGNOSIS — N400 Enlarged prostate without lower urinary tract symptoms: Secondary | ICD-10-CM | POA: Insufficient documentation

## 2021-12-03 DIAGNOSIS — N401 Enlarged prostate with lower urinary tract symptoms: Secondary | ICD-10-CM | POA: Diagnosis not present

## 2021-12-03 DIAGNOSIS — N138 Other obstructive and reflux uropathy: Secondary | ICD-10-CM

## 2021-12-03 NOTE — Progress Notes (Signed)
Subjective:  Patient ID: Brent Snow, male    DOB: Feb 10, 1941, 80 y.o.   MRN: [id number]  Patient Care Team: Janora Norlander, DO as PCP - General (Family Medicine) Collene Gobble, MD as Consulting Physician (Pulmonary Disease) Suella Broad, MD as Consulting Physician (Physical Medicine and Rehabilitation)   Chief Complaint:  Hospitalization Follow-up (Acute urinary retention/Dizziness/'woozy' feeling)   HPI: Brent Snow is a 80 y.o. male presenting on 12/03/2021 for Hospitalization Follow-up (Acute urinary retention/Dizziness/'woozy' feeling)  Patient presents today for hospital discharge follow-up.  Patient and wife state: 11/24/2021 they were out running errands and getting something to eat when patient started acting confused.  Wife states she was on the way to take him to the doctor and he started seizing in the car.  She stopped her daughters and called 30 and he was taken to the ED at [facility].  CT scan revealed bilateral subdural hematomas with mixed heterogenicity.  He was transferred to [facility] for treatment. He was admitted on 11/24/2021 and discharged 11/27/2021. He had a fall on 11/04/2021 injuring his shoulder. He was sen after fall with no mention of hitting head or LOC, neurological exam during that office visit was normal. Wife reports he fell out of bed hitting head on nightstand earlier November. SDHs remained stable and intervention was not required. CT of chest did reveal a 3.4 cm lung nodule for which he is scheduled to see pulmonology for. Left adrenal nodule was also noted, 12 month follow up recommended.  He had a foley catheter during admission, this was removed prior to discharge. On 11/28/2021 he had to return to the ED due to urinary retention. He was discharged home with foley catheter in place. Urologist increased flomax to BID dosing. He still has catheter in place and has follow up with urology scheduled.  He  reports he is doing well since being home. Denies recurrent seizure activity or confusion. No focal neurological deficits.   Relevant past medical, surgical, family, and social history reviewed and updated as indicated.  Allergies and medications reviewed and updated. Data reviewed: Chart in Epic.   Past Medical History:  Diagnosis Date   Arthritis    COPD, severe ([place])    pulmologist-  dr byrum   Dyspnea on exertion    prescribed to use O2 via [place]--- per pt he does not use O2 he just take 1-2 puffs of rescue inhaler   Hiatal hernia    History of acute respiratory failure    02-18-2007  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   Hypertension    Hypoxemic respiratory failure, chronic ([facility])    pulmologist-  dr byrum-- prescribed supplemental O2 @ 2L via [place]    OSA on CPAP    and added O2 @ 2L via [place] w/ cpap   Phimosis    S/P balloon dilatation of esophageal stricture    multiple    Type 2 diabetes mellitus ([place])    followed by pcp--  per note uncontrolled w/ last A1c >14 on 12-30-2016   Wears contact lenses     Past Surgical History:  Procedure Laterality Date   BACK SURGERY     CIRCUMCISION N/A 01/27/2017   Procedure: CIRCUMCISION ADULT;  Surgeon: Franchot Gallo, MD;  Location: [facility];  Service: Urology;  Laterality: N/A;   KNEE ARTHROSCOPY Right 2008   LUMBAR SPINE SURGERY  1977  approx.   TONSILLECTOMY AND ADENOIDECTOMY  age 27   TRANSTHORACIC ECHOCARDIOGRAM  09/12/2015   mild LVH,  ef 65-70%,  grade 1 diastolic dysfunction/  mild RAE    Social History   Socioeconomic History   Marital status: Married    Spouse name: Not on file   Number of children: 2   Years of education: 15   Highest education level: Some college, no degree  Occupational History   Occupation: Retired    Comment: Secondary school teacher  Tobacco Use   Smoking status: Former    Packs/day: 2.00    Years: 40.00    Pack years: 80.00    Types: Cigarettes     Quit date: 12/06/2006    Years since quitting: 15.0   Smokeless tobacco: Never  Vaping Use   Vaping Use: Never used  Substance and Sexual Activity   Alcohol use: Yes    Alcohol/week: 0.0 standard drinks    Comment: VERY LITTLE   Drug use: No   Sexual activity: Not on file  Other Topics Concern   Not on file  Social History Narrative   Not on file   Social Determinants of Health   Financial Resource Strain: Low Risk    Difficulty of Paying Living Expenses: Not hard at all  Food Insecurity: No Food Insecurity   Worried About Charity fundraiser in the Last Year: Never true   Ran Out of Food in the Last Year: Never true  Transportation Needs: No Transportation Needs   Lack of Transportation (Medical): No   Lack of Transportation (Non-Medical): No  Physical Activity: Sufficiently Active   Days of Exercise per Week: 3 days   Minutes of Exercise per Session: 60 min  Stress: No Stress Concern Present   Feeling of Stress : Not at all  Social Connections: Socially Integrated   Frequency of Communication with Friends and Family: More than three times a week   Frequency of Social Gatherings with Friends and Family: More than three times a week   Attends Religious Services: More than 4 times per year   Active Member of [employer] or Organizations: Yes   Attends Archivist Meetings: More than 4 times per year   Marital Status: Married  Human resources officer Violence: Not on file    Outpatient Encounter Medications as of 12/03/2021  Medication Sig   Albuterol Sulfate (PROAIR [place]PICLICK) 161 (90 Base) MCG/ACT AEPB Inhale into the lungs.   cyclobenzaprine (FLEXERIL) 10 MG tablet Take 1 tablet (10 mg total) by mouth 2 (two) times daily as needed for muscle spasms.   Dulaglutide (TRULICITY) 1.5 WR/6.0AV SOPN Inject into the skin.   Fluticasone-Umeclidin-Vilant (TRELEGY ELLIPTA) 100-62.5-25 MCG/ACT AEPB Inhale into the lungs.   glucose blood test strip Check BS QD and PRN   Lacosamide 100  MG TABS Take by mouth.   Lancets (ONETOUCH ULTRASOFT) lancets    lisinopril (ZESTRIL) 20 MG tablet Take 1 tablet (20 mg total) by mouth daily.   metFORMIN (GLUCOPHAGE) 500 MG tablet Take 1 tablet (500 mg total) by mouth daily with breakfast. TAKE 2 TABLETS BY MOUTH TWICE DAILY WITH A MEAL   pravastatin (PRAVACHOL) 40 MG tablet Take by mouth.   tamsulosin (FLOMAX) 0.4 MG CAPS capsule Take by mouth 2 (two) times daily.   traMADol (ULTRAM) 50 MG tablet Take 1 tablet (50 mg total) by mouth every 6 (six) hours as needed.   [DISCONTINUED] albuterol (PROAIR HFA) 108 (90 Base) MCG/ACT inhaler Inhale 2 puffs into the lungs every 4 (four) hours  as needed.   [DISCONTINUED] Dulaglutide (TRULICITY) 1.5 VZ/5.6LO SOPN Inject 1.5 mg into the skin once a week.   [DISCONTINUED] Fluticasone-Umeclidin-Vilant (TRELEGY ELLIPTA) 100-62.5-25 MCG/INH AEPB USE 1 INHALATION ORALLY    DAILY   [DISCONTINUED] pravastatin (PRAVACHOL) 40 MG tablet Take 1 tablet (40 mg total) by mouth daily.   [DISCONTINUED] tamsulosin (FLOMAX) 0.4 MG CAPS capsule Take 1 capsule (0.4 mg total) by mouth daily after supper.   No facility-administered encounter medications on file as of 12/03/2021.    No Known Allergies  Review of Systems  Constitutional:  Negative for activity change, appetite change, chills, diaphoresis, fatigue, fever and unexpected weight change.  Respiratory:  Negative for cough and shortness of breath.   Cardiovascular:  Negative for chest pain, palpitations and leg swelling.  Gastrointestinal:  Negative for abdominal pain.  Genitourinary:  Positive for difficulty urinating. Negative for hematuria.  Neurological:  Negative for dizziness, tremors, seizures, syncope, facial asymmetry, speech difficulty, weakness, light-headedness, numbness and headaches.  Psychiatric/Behavioral:  Negative for confusion.   All other systems reviewed and are negative.      Objective:  BP (!) 142/84    Pulse 84    Temp 98.1 F (36.7 C)     Ht '5\' 6"'  (1.676 m)    Wt 205 lb (93 kg)    SpO2 94%    BMI 33.09 kg/m    Wt Readings from Last 3 Encounters:  12/03/21 205 lb (93 kg)  11/04/21 207 lb (93.9 kg)  07/06/21 200 lb 12.8 oz (91.1 kg)    Physical Exam Vitals and nursing note reviewed. Exam conducted with a chaperone present.  Constitutional:      General: He is not in acute distress.    Appearance: Normal appearance. He is obese. He is not ill-appearing, toxic-appearing or diaphoretic.  HENT:     Head: Normocephalic and atraumatic.  Eyes:     Pupils: Pupils are equal, round, and reactive to light.  Cardiovascular:     Rate and Rhythm: Normal rate and regular rhythm.     Heart sounds: Normal heart sounds. No murmur heard.   No friction rub. No gallop.  Pulmonary:     Effort: Pulmonary effort is normal.     Breath sounds: Normal breath sounds.  Abdominal:     General: Abdomen is protuberant. There is no distension.     Palpations: Abdomen is soft.     Tenderness: There is no abdominal tenderness. There is no right CVA tenderness or left CVA tenderness.  Genitourinary:    Penis: Normal and circumcised.      Comments: Foley catheter in place, draining clear yellow urine into leg bag, no erythema or drainage noted from penis Skin:    General: Skin is warm and dry.     Capillary Refill: Capillary refill takes less than 2 seconds.  Neurological:     General: No focal deficit present.     Mental Status: He is alert and oriented to person, place, and time.  Psychiatric:        Mood and Affect: Mood normal.        Behavior: Behavior normal.        Thought Content: Thought content normal.        Judgment: Judgment normal.    Results for orders placed or performed in visit on 07/09/21  [facility]  Result Value Ref Range   Glucose 130 (H) 65 - 99 mg/dL   BUN 16 8 - 27 mg/dL   Creatinine, Ser 0.94  0.76 - 1.27 mg/dL   eGFR 83 >59 mL/min/1.73   BUN/Creatinine Ratio 17 10 - 24   Sodium 139 134 - 144 mmol/L    Potassium 4.7 3.5 - 5.2 mmol/L   Chloride 101 96 - 106 mmol/L   CO2 24 20 - 29 mmol/L   Calcium 9.4 8.6 - 10.2 mg/dL  Lipid panel  Result Value Ref Range   Cholesterol, Total 112 100 - 199 mg/dL   Triglycerides 76 0 - 149 mg/dL   HDL 44 >39 mg/dL   VLDL Cholesterol Cal 16 5 - 40 mg/dL   LDL Chol Calc (NIH) 52 0 - 99 mg/dL   Chol/HDL Ratio 2.5 0.0 - 5.0 ratio       Pertinent labs & imaging results that were available during my care of the patient were reviewed by me and considered in my medical decision making.  Assessment & Plan:  Kamarion was seen today for hospitalization follow-up.  Diagnoses and all orders for this visit:  Chronic subdural hematoma ([place]) Seizure disorder Has not followed up with neurology since discharge, would like to return to [facility] since this is where he was admitted and treated. Referral placed. No neurological deficits noted. No recurrent seizure activity. Home health is involved, PT is coming to house.  -     Ambulatory referral to Neurology  BPH with urinary obstruction Indwelling foley draining clear yellow urine. Has follow up with urology on Tuesday. Foley care instructions went over in detail.   Adrenal nodule (HCC) Noted on imaging, recommended 12 month follow up scan as nodule was likely benign.   Lung nodule noted on imaging Has follow up with pulmonology scheduled, should have repeat CXR in 4-6 weeks per radiologist recommendations.    Continue all other maintenance medications.  Follow up plan: Return in about 1 month (around 01/03/2022), or if symptoms worsen or fail to improve, for PCP.   Continue healthy lifestyle choices, including diet (rich in fruits, vegetables, and lean proteins, and low in salt and simple carbohydrates) and exercise (at least 30 minutes of moderate physical activity daily).  Educational handout given for foley catheter care  The above assessment and management plan was discussed with the patient. The patient  verbalized understanding of and has agreed to the management plan. Patient is aware to call the clinic if they develop any new symptoms or if symptoms persist or worsen. Patient is aware when to return to the clinic for a follow-up visit. Patient educated on when it is appropriate to go to the emergency department.   Monia Pouch, FNP-C [facility] [phone]

## 2021-12-05 DIAGNOSIS — Z7984 Long term (current) use of oral hypoglycemic drugs: Secondary | ICD-10-CM | POA: Diagnosis not present

## 2021-12-05 DIAGNOSIS — J449 Chronic obstructive pulmonary disease, unspecified: Secondary | ICD-10-CM | POA: Diagnosis not present

## 2021-12-05 DIAGNOSIS — T83091A Other mechanical complication of indwelling urethral catheter, initial encounter: Secondary | ICD-10-CM | POA: Diagnosis not present

## 2021-12-05 DIAGNOSIS — Z87891 Personal history of nicotine dependence: Secondary | ICD-10-CM | POA: Diagnosis not present

## 2021-12-05 DIAGNOSIS — T83098A Other mechanical complication of other indwelling urethral catheter, initial encounter: Secondary | ICD-10-CM | POA: Diagnosis not present

## 2021-12-05 DIAGNOSIS — E119 Type 2 diabetes mellitus without complications: Secondary | ICD-10-CM | POA: Diagnosis not present

## 2021-12-05 DIAGNOSIS — R339 Retention of urine, unspecified: Secondary | ICD-10-CM | POA: Diagnosis not present

## 2021-12-05 DIAGNOSIS — I10 Essential (primary) hypertension: Secondary | ICD-10-CM | POA: Diagnosis not present

## 2021-12-05 DIAGNOSIS — Z79899 Other long term (current) drug therapy: Secondary | ICD-10-CM | POA: Diagnosis not present

## 2021-12-06 DIAGNOSIS — I10 Essential (primary) hypertension: Secondary | ICD-10-CM | POA: Diagnosis not present

## 2021-12-06 DIAGNOSIS — Z7985 Long-term (current) use of injectable non-insulin antidiabetic drugs: Secondary | ICD-10-CM | POA: Diagnosis not present

## 2021-12-06 DIAGNOSIS — Z87891 Personal history of nicotine dependence: Secondary | ICD-10-CM | POA: Diagnosis not present

## 2021-12-06 DIAGNOSIS — T8389XA Other specified complication of genitourinary prosthetic devices, implants and grafts, initial encounter: Secondary | ICD-10-CM | POA: Diagnosis not present

## 2021-12-06 DIAGNOSIS — Z7984 Long term (current) use of oral hypoglycemic drugs: Secondary | ICD-10-CM | POA: Diagnosis not present

## 2021-12-06 DIAGNOSIS — Z79899 Other long term (current) drug therapy: Secondary | ICD-10-CM | POA: Diagnosis not present

## 2021-12-06 DIAGNOSIS — E119 Type 2 diabetes mellitus without complications: Secondary | ICD-10-CM | POA: Diagnosis not present

## 2021-12-06 DIAGNOSIS — R339 Retention of urine, unspecified: Secondary | ICD-10-CM | POA: Diagnosis not present

## 2021-12-06 DIAGNOSIS — J449 Chronic obstructive pulmonary disease, unspecified: Secondary | ICD-10-CM | POA: Diagnosis not present

## 2021-12-07 NOTE — Progress Notes (Signed)
History of Present Illness: 2.15.2022: Brent Snow is here today following a visit here last week as well as a visit to urgent care. He was seen for retention on 2.9.2022 and had a catheter placed at that time. He reports that his acute symptoms presented suddenly on 2.8.2022. He denies any associated constipation or decongestant use. He reports that leading up to this episode he was experiencing some urinary hesitancy and a weakened FOS. He was started on tamsulosin.   4.26.2022: He is now off of tamsulosin which was just a short course of medicine.  Overall, he is voiding adequately and denies any significant slow stream.  He states that he has better pressure with urination.  He would like a refill on his sildenafil.  1.3.2023: He was recently hospitalized at [facility] in [place] for an intracranial bleed.  He had a catheter placed due to retention.  He has had it replaced twice with failed voiding trials.  He is here today for T OV.  He is on Flomax twice a day.    Past Medical History:  Diagnosis Date   Arthritis    COPD, severe ([place])    pulmologist-  dr byrum   Dyspnea on exertion    prescribed to use O2 via [place]--- per pt he does not use O2 he just take 1-2 puffs of rescue inhaler   Hiatal hernia    History of acute respiratory failure    02-18-2007  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   Hypertension    Hypoxemic respiratory failure, chronic (HCC)    pulmologist-  dr byrum-- prescribed supplemental O2 @ 2L via [place]    OSA on CPAP    and added O2 @ 2L via [place] w/ cpap   Phimosis    S/P balloon dilatation of esophageal stricture    multiple    Type 2 diabetes mellitus ([place])    followed by pcp--  per note uncontrolled w/ last A1c >14 on 12-30-2016   Wears contact lenses     Past Surgical History:  Procedure Laterality Date   BACK SURGERY     CIRCUMCISION N/A 01/27/2017   Procedure: CIRCUMCISION ADULT;  Surgeon: Franchot Gallo, MD;  Location: [facility];   Service: Urology;  Laterality: N/A;   KNEE ARTHROSCOPY Right 2008   LUMBAR SPINE SURGERY  1977  approx.   TONSILLECTOMY AND ADENOIDECTOMY  age 81   TRANSTHORACIC ECHOCARDIOGRAM  09/12/2015   mild LVH,  ef 65-70%,  grade 1 diastolic dysfunction/  mild RAE    Home Medications:  Allergies as of 12/08/2021   No Known Allergies      Medication List        Accurate as of December 07, 2021  8:04 PM. If you have any questions, ask your nurse or doctor.          Albuterol Sulfate 108 (90 Base) MCG/ACT Aepb Commonly known as: PROAIR RESPICLICK Inhale into the lungs.   cyclobenzaprine 10 MG tablet Commonly known as: FLEXERIL Take 1 tablet (10 mg total) by mouth 2 (two) times daily as needed for muscle spasms.   glucose blood test strip Check BS QD and PRN   Lacosamide 100 MG Tabs Take by mouth.   lisinopril 20 MG tablet Commonly known as: ZESTRIL Take 1 tablet (20 mg total) by mouth daily.   metFORMIN 500 MG tablet Commonly known as: GLUCOPHAGE Take 1 tablet (500 mg total) by mouth daily with breakfast. TAKE 2 TABLETS BY MOUTH TWICE  DAILY WITH A MEAL   onetouch ultrasoft lancets   pravastatin 40 MG tablet Commonly known as: PRAVACHOL Take by mouth.   tamsulosin 0.4 MG Caps capsule Commonly known as: FLOMAX Take by mouth 2 (two) times daily.   traMADol 50 MG tablet Commonly known as: ULTRAM Take 1 tablet (50 mg total) by mouth every 6 (six) hours as needed.   Trelegy Ellipta 100-62.5-25 MCG/ACT Aepb Generic drug: Fluticasone-Umeclidin-Vilant Inhale into the lungs.   Trulicity 1.5 SM/2.7MB Sopn Generic drug: Dulaglutide Inject into the skin.        Allergies: No Known Allergies  Family History  Problem Relation Age of Onset   Asthma Mother    COPD Mother    Cancer Maternal Grandmother        stomach   Early death Father 3       Tractor Accident    Social History:  reports that he quit smoking about 15 years ago. His smoking use included cigarettes.  He has a 80.00 pack-year smoking history. He has never used smokeless tobacco. He reports current alcohol use. He reports that he does not use drugs.  ROS: A complete review of systems was performed.  All systems are negative except for pertinent findings as noted.  Physical Exam:  Vital signs in last 24 hours: There were no vitals taken for this visit. Constitutional:  Alert and oriented, No acute distress Cardiovascular: Regular rate  Respiratory: Normal respiratory effort Neurologic: Grossly intact, no focal deficits Psychiatric: Normal mood and affect  I have reviewed prior pt notes  I have reviewed notes from referring/previous physicians     Impression/Assessment:  1.  BPH, on dual Flomax, with recent retention  2.  Urinary retention, failed voiding trial x3, catheter replaced today  Plan:  1.  I will also start him on finasteride  2.  He will continue 2 doses of tamsulosin a day  3.  I will have him come back in about 3 weeks for another voiding trial  4.  Multiple questions answered regarding the etiology of his retention.  I explained that he does have BPH, a large prostate and with his recent medical issues, this has no doubt weakened him.  I do think that eventually he will be able to void as he did back in April of this year after another hospitalization.  However, I also let them know that he may eventually need intervention for his BPH on a nonpharmacologic basis.

## 2021-12-08 ENCOUNTER — Ambulatory Visit: Payer: Medicare Other | Admitting: Family Medicine

## 2021-12-08 ENCOUNTER — Ambulatory Visit (INDEPENDENT_AMBULATORY_CARE_PROVIDER_SITE_OTHER): Payer: Medicare Other | Admitting: Urology

## 2021-12-08 ENCOUNTER — Other Ambulatory Visit: Payer: Self-pay

## 2021-12-08 ENCOUNTER — Encounter: Payer: Self-pay | Admitting: Urology

## 2021-12-08 VITALS — BP 119/70 | Temp 90.0°F

## 2021-12-08 DIAGNOSIS — N401 Enlarged prostate with lower urinary tract symptoms: Secondary | ICD-10-CM

## 2021-12-08 DIAGNOSIS — R339 Retention of urine, unspecified: Secondary | ICD-10-CM

## 2021-12-08 DIAGNOSIS — N138 Other obstructive and reflux uropathy: Secondary | ICD-10-CM | POA: Diagnosis not present

## 2021-12-08 MED ORDER — TAMSULOSIN HCL 0.4 MG PO CAPS: 0.4000 mg | ORAL_CAPSULE | Freq: Two times a day (BID) | ORAL | 3 refills | Status: DC

## 2021-12-08 MED ORDER — FINASTERIDE 5 MG PO TABS: 5.0000 mg | ORAL_TABLET | Freq: Every day | ORAL | 11 refills | Status: DC

## 2021-12-08 NOTE — Progress Notes (Signed)
Simple Catheter Placement  Due to urinary retention patient is present today for a foley cath placement.  Patient was cleaned and prepped in a sterile fashion with betadine. A 16 FR foley catheter was inserted, urine return was noted  117ml, urine was yellow in color.  The balloon was filled with 10cc of sterile water.  A leg bag was attached for drainage. Patient was also given a night bag to take home and was given instruction on how to change from one bag to another.  Patient was given instruction on proper catheter care.  Patient tolerated well, no complications were noted   Performed by: Estill Bamberg RN  Additional notes/ Follow up: patient to see MD

## 2021-12-08 NOTE — Progress Notes (Signed)
Urological Symptom Review  Patient is experiencing the following symptoms: Penile pain (male only)    Review of Systems  Gastrointestinal (upper)  : Negative for upper GI symptoms  Gastrointestinal (lower) : Negative for lower GI symptoms  Constitutional : Negative for symptoms  Skin: Negative for skin symptoms  Eyes: Negative for eye symptoms  Ear/Nose/Throat : Negative for Ear/Nose/Throat symptoms  Hematologic/Lymphatic: Negative for Hematologic/Lymphatic symptoms  Cardiovascular : Negative for cardiovascular symptoms  Respiratory : Negative for respiratory symptoms  Endocrine: Negative for endocrine symptoms  Musculoskeletal: Negative for musculoskeletal symptoms  Neurological: Dizziness  Psychologic: Negative for psychiatric symptoms

## 2021-12-10 DIAGNOSIS — J449 Chronic obstructive pulmonary disease, unspecified: Secondary | ICD-10-CM | POA: Diagnosis not present

## 2021-12-10 DIAGNOSIS — S4352XD Sprain of left acromioclavicular joint, subsequent encounter: Secondary | ICD-10-CM | POA: Diagnosis not present

## 2021-12-10 DIAGNOSIS — K219 Gastro-esophageal reflux disease without esophagitis: Secondary | ICD-10-CM | POA: Diagnosis not present

## 2021-12-10 DIAGNOSIS — Z7985 Long-term (current) use of injectable non-insulin antidiabetic drugs: Secondary | ICD-10-CM | POA: Diagnosis not present

## 2021-12-10 DIAGNOSIS — R561 Post traumatic seizures: Secondary | ICD-10-CM | POA: Diagnosis not present

## 2021-12-10 DIAGNOSIS — E785 Hyperlipidemia, unspecified: Secondary | ICD-10-CM | POA: Diagnosis not present

## 2021-12-10 DIAGNOSIS — Z7951 Long term (current) use of inhaled steroids: Secondary | ICD-10-CM | POA: Diagnosis not present

## 2021-12-10 DIAGNOSIS — Z9181 History of falling: Secondary | ICD-10-CM | POA: Diagnosis not present

## 2021-12-10 DIAGNOSIS — S065X0D Traumatic subdural hemorrhage without loss of consciousness, subsequent encounter: Secondary | ICD-10-CM | POA: Diagnosis not present

## 2021-12-10 DIAGNOSIS — G935 Compression of brain: Secondary | ICD-10-CM | POA: Diagnosis not present

## 2021-12-10 DIAGNOSIS — N400 Enlarged prostate without lower urinary tract symptoms: Secondary | ICD-10-CM | POA: Diagnosis not present

## 2021-12-10 DIAGNOSIS — G4733 Obstructive sleep apnea (adult) (pediatric): Secondary | ICD-10-CM | POA: Diagnosis not present

## 2021-12-10 DIAGNOSIS — Z466 Encounter for fitting and adjustment of urinary device: Secondary | ICD-10-CM | POA: Diagnosis not present

## 2021-12-10 DIAGNOSIS — I10 Essential (primary) hypertension: Secondary | ICD-10-CM | POA: Diagnosis not present

## 2021-12-10 DIAGNOSIS — Z87891 Personal history of nicotine dependence: Secondary | ICD-10-CM | POA: Diagnosis not present

## 2021-12-10 DIAGNOSIS — Z7984 Long term (current) use of oral hypoglycemic drugs: Secondary | ICD-10-CM | POA: Diagnosis not present

## 2021-12-10 DIAGNOSIS — E119 Type 2 diabetes mellitus without complications: Secondary | ICD-10-CM | POA: Diagnosis not present

## 2021-12-11 DIAGNOSIS — J449 Chronic obstructive pulmonary disease, unspecified: Secondary | ICD-10-CM | POA: Diagnosis not present

## 2021-12-11 DIAGNOSIS — R561 Post traumatic seizures: Secondary | ICD-10-CM | POA: Diagnosis not present

## 2021-12-11 DIAGNOSIS — K219 Gastro-esophageal reflux disease without esophagitis: Secondary | ICD-10-CM | POA: Diagnosis not present

## 2021-12-11 DIAGNOSIS — E119 Type 2 diabetes mellitus without complications: Secondary | ICD-10-CM | POA: Diagnosis not present

## 2021-12-11 DIAGNOSIS — S065X0D Traumatic subdural hemorrhage without loss of consciousness, subsequent encounter: Secondary | ICD-10-CM | POA: Diagnosis not present

## 2021-12-11 DIAGNOSIS — I10 Essential (primary) hypertension: Secondary | ICD-10-CM | POA: Diagnosis not present

## 2021-12-14 ENCOUNTER — Ambulatory Visit (INDEPENDENT_AMBULATORY_CARE_PROVIDER_SITE_OTHER): Payer: Medicare Other | Admitting: Orthopedic Surgery

## 2021-12-14 ENCOUNTER — Encounter: Payer: Self-pay | Admitting: Orthopedic Surgery

## 2021-12-14 ENCOUNTER — Ambulatory Visit (INDEPENDENT_AMBULATORY_CARE_PROVIDER_SITE_OTHER): Payer: Medicare Other | Admitting: Family Medicine

## 2021-12-14 ENCOUNTER — Encounter: Payer: Self-pay | Admitting: Family Medicine

## 2021-12-14 VITALS — BP 127/71 | HR 78 | Temp 97.8°F | Ht 66.0 in | Wt 205.0 lb

## 2021-12-14 DIAGNOSIS — S43102D Unspecified dislocation of left acromioclavicular joint, subsequent encounter: Secondary | ICD-10-CM | POA: Diagnosis not present

## 2021-12-14 DIAGNOSIS — N138 Other obstructive and reflux uropathy: Secondary | ICD-10-CM | POA: Diagnosis not present

## 2021-12-14 DIAGNOSIS — G40909 Epilepsy, unspecified, not intractable, without status epilepticus: Secondary | ICD-10-CM | POA: Diagnosis not present

## 2021-12-14 DIAGNOSIS — I6203 Nontraumatic chronic subdural hemorrhage: Secondary | ICD-10-CM | POA: Diagnosis not present

## 2021-12-14 DIAGNOSIS — N401 Enlarged prostate with lower urinary tract symptoms: Secondary | ICD-10-CM | POA: Diagnosis not present

## 2021-12-14 NOTE — Progress Notes (Signed)
New Patient Visit  Assessment: Brent Snow is a 81 y.o. male with the following: 1. AC joint dislocation, left  Plan: Pain is improving.   Near full ROM OK to advance with PT once full ROM Lifting limited to 5 lbs, or a gallon of milk for the next 2 weeks No restrictions otherwise.  Avoid NSAIDs, but topical voltaren gel ok, Follow up as needed.    Follow-up: Return if symptoms worsen or fail to improve.  Subjective:  Chief Complaint  Patient presents with   Shoulder Pain    LEFT 4 week follow up/patient states he can't tell a whole lot of difference in the pain    History of Present Illness: Brent Snow is a 81 y.o. male who returns to clinic for repeat evaluation of his left shoulder pain.  He sustained a fall greater than 4 weeks ago, and injured his left AC joint.  Since then, he has had multiple medical issues.  He has been hospitalized following a grand mal seizure, which is believed to have been related to bleeding on his brain after the fall.  He is also had some issues with urinary retention.  He currently has a indwelling catheter, with appropriate follow-up with urology.  Regarding his left shoulder, he has no pain at rest.  With motion and strength testing, he states the pain gets as bad as a 4/10.  He is unable to take NSAIDs due to the bleeding.  He is not using a sling.  He is notes his range of motion is significantly improved.  Review of Systems: No fevers or chills No numbness or tingling No chest pain No shortness of breath No bowel or bladder dysfunction No GI distress No headaches   Objective: There were no vitals taken for this visit.  Physical Exam:  General: Elderly male., Alert and oriented., and No acute distress. Gait: Normal gait.  Mild swelling over the superior aspect of the shoulder.  Minimal tenderness to palpation over the [facility] joint.  Ecchymosis is improved.  160 degrees of forward flexion.  Abduction to 85 degrees.   Internal rotation to his lumbar spine.  4/5 strength in the supraspinatus, infraspinatus.  Negative belly press.  IMAGING: I personally reviewed images previously obtained in clinic  No new imaging obtained today.  New Medications:  No orders of the defined types were placed in this encounter.     Mordecai Rasmussen, MD  12/14/2021 2:32 PM

## 2021-12-14 NOTE — Progress Notes (Signed)
Subjective: CC: Follow-up seizure/brain bleed PCP: Janora Norlander, DO GEX:BMWUXL Brent Snow is a 81 y.o. male presenting to clinic today for:  1. Subdural hematoma  Patient was diagnosed with subdural hematoma on November 24, 2021.  He was subsequently transferred to [facility] for admission.  Bur hole was considered but because he was appearing to resorb his hematoma this was not proceeded with.  He unfortunately did have witnessed seizures prior to admission by his wife and he has subsequently and placed on Vimpat.  He will be having a repeat CT scan of his brain this week and will see the neurologist or neurosurgeon following.  He understands that he will not drive for 6 months given recent seizure activity.  His wife comments that he does seem to react a little slower and seem a little more sluggish than his normal.  She wonders if this is medication induced.  2.  Urinary retention Patient has been struggling with urinary retention as well.  He has had at least 4 different catheters placed.  He recently saw urology and had a new 1 placed during that visit.  He was started on Proscar and advised not to resume tamsulosin for at least 1 week.  He will be reevaluated in a couple of weeks by urology.  He denies any blood clots within the catheter.  He feels that he is having good urine output.   ROS: Per HPI  No Known Allergies Past Medical History:  Diagnosis Date   Arthritis    COPD, severe ([place])    pulmologist-  dr byrum   Dyspnea on exertion    prescribed to use O2 via [place]--- per pt he does not use O2 he just take 1-2 puffs of rescue inhaler   Hiatal hernia    History of acute respiratory failure    02-18-2007  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   Hypertension    Hypoxemic respiratory failure, chronic (HCC)    pulmologist-  dr byrum-- prescribed supplemental O2 @ 2L via [place]    OSA on CPAP    and added O2 @ 2L via [place] w/ cpap   Phimosis    S/P balloon  dilatation of esophageal stricture    multiple    Type 2 diabetes mellitus ([place])    followed by pcp--  per note uncontrolled w/ last A1c >14 on 12-30-2016   Wears contact lenses     Current Outpatient Medications:    Albuterol Sulfate (PROAIR RESPICLICK) 244 (90 Base) MCG/ACT AEPB, Inhale into the lungs., Disp: , Rfl:    cyclobenzaprine (FLEXERIL) 10 MG tablet, Take 1 tablet (10 mg total) by mouth 2 (two) times daily as needed for muscle spasms., Disp: 20 tablet, Rfl: 0   Dulaglutide (TRULICITY) 1.5 WN/0.2VO SOPN, Inject into the skin., Disp: , Rfl:    finasteride (PROSCAR) 5 MG tablet, Take 1 tablet (5 mg total) by mouth daily., Disp: 30 tablet, Rfl: 11   Fluticasone-Umeclidin-Vilant (TRELEGY ELLIPTA) 100-62.5-25 MCG/ACT AEPB, Inhale into the lungs., Disp: , Rfl:    glucose blood test strip, Check BS QD and PRN, Disp: 100 each, Rfl: 5   Lacosamide 100 MG TABS, Take by mouth., Disp: , Rfl:    Lancets (ONETOUCH ULTRASOFT) lancets, , Disp: , Rfl:    lisinopril (ZESTRIL) 20 MG tablet, Take 1 tablet (20 mg total) by mouth daily., Disp: 90 tablet, Rfl: 3   metFORMIN (GLUCOPHAGE) 500 MG tablet, Take 1 tablet (500 mg total) by  mouth daily with breakfast. TAKE 2 TABLETS BY MOUTH TWICE DAILY WITH A MEAL, Disp: 90 tablet, Rfl: 3   tamsulosin (FLOMAX) 0.4 MG CAPS capsule, Take 1 capsule (0.4 mg total) by mouth 2 (two) times daily., Disp: 180 capsule, Rfl: 3   traMADol (ULTRAM) 50 MG tablet, Take 1 tablet (50 mg total) by mouth every 6 (six) hours as needed., Disp: 20 tablet, Rfl: 5 Social History   Socioeconomic History   Marital status: Married    Spouse name: Not on file   Number of children: 2   Years of education: 15   Highest education level: Some college, no degree  Occupational History   Occupation: Retired    Comment: Secondary school teacher  Tobacco Use   Smoking status: Former    Packs/day: 2.00    Years: 40.00    Pack years: 80.00    Types: Cigarettes    Quit date:  12/06/2006    Years since quitting: 15.0   Smokeless tobacco: Never  Vaping Use   Vaping Use: Never used  Substance and Sexual Activity   Alcohol use: Yes    Alcohol/week: 0.0 standard drinks    Comment: VERY LITTLE   Drug use: No   Sexual activity: Not on file  Other Topics Concern   Not on file  Social History Narrative   Not on file   Social Determinants of Health   Financial Resource Strain: Low Risk    Difficulty of Paying Living Expenses: Not hard at all  Food Insecurity: No Food Insecurity   Worried About Charity fundraiser in the Last Year: Never true   Ran Out of Food in the Last Year: Never true  Transportation Needs: No Transportation Needs   Lack of Transportation (Medical): No   Lack of Transportation (Non-Medical): No  Physical Activity: Sufficiently Active   Days of Exercise per Week: 3 days   Minutes of Exercise per Session: 60 min  Stress: No Stress Concern Present   Feeling of Stress : Not at all  Social Connections: Socially Integrated   Frequency of Communication with Friends and Family: More than three times a week   Frequency of Social Gatherings with Friends and Family: More than three times a week   Attends Religious Services: More than 4 times per year   Active Member of [employer] or Organizations: Yes   Attends Music therapist: More than 4 times per year   Marital Status: Married  Human resources officer Violence: Not on file   Family History  Problem Relation Age of Onset   Asthma Mother    COPD Mother    Cancer Maternal Grandmother        stomach   Early death Father 44       Tractor Accident    Objective: Office vital signs reviewed. BP 127/71    Pulse 78    Temp 97.8 F (36.6 C)    Ht 5\' 6"  (1.676 m)    Wt 205 lb (93 kg)    SpO2 93%    BMI 33.09 kg/m   Physical Examination:  General: Awake, alert, chronically ill-appearing male, No acute distress HEENT: Sclera white. Cardio: regular rate and rhythm, S1S2 heard, no murmurs  appreciated Pulm: Normal work of breathing on room air Neuro: Speech is somewhat slower than his norm.  Assessment/ Plan: 81 y.o. male   Chronic subdural hematoma (HCC)  Seizure disorder ([place])  BPH with urinary obstruction  Given recent seizure activity have advised  totally against use of tramadol since this does lower seizure threshold.  He will continue with Vimpat.  I eagerly await his recommendations by specialty.  Asked that they fax me any records with each visit.  He seems to be doing okay with this new catheter.  Continue current recommendations as outlined by urology.  No orders of the defined types were placed in this encounter.  No orders of the defined types were placed in this encounter.    Janora Norlander, DO [facility] [phone]

## 2021-12-14 NOTE — Patient Instructions (Signed)
Left AC joint dislocation - treated without surgery.  Injury sustained > 4 weeks ago  At this point, ok to increase ROM as tolerated.  Once he has achieved full ROM, can start to work on strengthening.  Limit lifting with the left arm to nothing more than a gallon of milk, or ~5 lbs for the next 2 weeks.  After that, it is ok to increase lifting as tolerated.   Otherwise, no activity restrictions.   If you require anything further, please contact my office.    Joleen Stuckert A. Amedeo Kinsman, MD [facility] [address] [place],  [place]  [address] Phone: [phone] Fax: [phone]

## 2021-12-15 DIAGNOSIS — R561 Post traumatic seizures: Secondary | ICD-10-CM | POA: Diagnosis not present

## 2021-12-15 DIAGNOSIS — S065X0D Traumatic subdural hemorrhage without loss of consciousness, subsequent encounter: Secondary | ICD-10-CM | POA: Diagnosis not present

## 2021-12-15 DIAGNOSIS — E119 Type 2 diabetes mellitus without complications: Secondary | ICD-10-CM | POA: Diagnosis not present

## 2021-12-15 DIAGNOSIS — K219 Gastro-esophageal reflux disease without esophagitis: Secondary | ICD-10-CM | POA: Diagnosis not present

## 2021-12-15 DIAGNOSIS — J449 Chronic obstructive pulmonary disease, unspecified: Secondary | ICD-10-CM | POA: Diagnosis not present

## 2021-12-15 DIAGNOSIS — I10 Essential (primary) hypertension: Secondary | ICD-10-CM | POA: Diagnosis not present

## 2021-12-16 ENCOUNTER — Ambulatory Visit (INDEPENDENT_AMBULATORY_CARE_PROVIDER_SITE_OTHER): Payer: Medicare Other

## 2021-12-16 ENCOUNTER — Other Ambulatory Visit: Payer: Self-pay

## 2021-12-16 DIAGNOSIS — G4733 Obstructive sleep apnea (adult) (pediatric): Secondary | ICD-10-CM | POA: Diagnosis not present

## 2021-12-16 DIAGNOSIS — N400 Enlarged prostate without lower urinary tract symptoms: Secondary | ICD-10-CM | POA: Diagnosis not present

## 2021-12-16 DIAGNOSIS — Z7951 Long term (current) use of inhaled steroids: Secondary | ICD-10-CM

## 2021-12-16 DIAGNOSIS — E119 Type 2 diabetes mellitus without complications: Secondary | ICD-10-CM

## 2021-12-16 DIAGNOSIS — R561 Post traumatic seizures: Secondary | ICD-10-CM

## 2021-12-16 DIAGNOSIS — G319 Degenerative disease of nervous system, unspecified: Secondary | ICD-10-CM | POA: Diagnosis not present

## 2021-12-16 DIAGNOSIS — S4352XD Sprain of left acromioclavicular joint, subsequent encounter: Secondary | ICD-10-CM

## 2021-12-16 DIAGNOSIS — E785 Hyperlipidemia, unspecified: Secondary | ICD-10-CM | POA: Diagnosis not present

## 2021-12-16 DIAGNOSIS — K219 Gastro-esophageal reflux disease without esophagitis: Secondary | ICD-10-CM

## 2021-12-16 DIAGNOSIS — S065XAA Traumatic subdural hemorrhage with loss of consciousness status unknown, initial encounter: Secondary | ICD-10-CM | POA: Diagnosis not present

## 2021-12-16 DIAGNOSIS — Z7984 Long term (current) use of oral hypoglycemic drugs: Secondary | ICD-10-CM

## 2021-12-16 DIAGNOSIS — J449 Chronic obstructive pulmonary disease, unspecified: Secondary | ICD-10-CM

## 2021-12-16 DIAGNOSIS — Z466 Encounter for fitting and adjustment of urinary device: Secondary | ICD-10-CM

## 2021-12-16 DIAGNOSIS — I10 Essential (primary) hypertension: Secondary | ICD-10-CM

## 2021-12-16 DIAGNOSIS — S065X0D Traumatic subdural hemorrhage without loss of consciousness, subsequent encounter: Secondary | ICD-10-CM | POA: Diagnosis not present

## 2021-12-16 DIAGNOSIS — Z7985 Long-term (current) use of injectable non-insulin antidiabetic drugs: Secondary | ICD-10-CM

## 2021-12-16 DIAGNOSIS — I6200 Nontraumatic subdural hemorrhage, unspecified: Secondary | ICD-10-CM | POA: Diagnosis not present

## 2021-12-17 DIAGNOSIS — S065XAA Traumatic subdural hemorrhage with loss of consciousness status unknown, initial encounter: Secondary | ICD-10-CM | POA: Diagnosis not present

## 2021-12-22 ENCOUNTER — Encounter: Payer: Self-pay | Admitting: Emergency Medicine

## 2021-12-22 DIAGNOSIS — I10 Essential (primary) hypertension: Secondary | ICD-10-CM | POA: Diagnosis not present

## 2021-12-22 DIAGNOSIS — R561 Post traumatic seizures: Secondary | ICD-10-CM | POA: Diagnosis not present

## 2021-12-22 DIAGNOSIS — S065X0D Traumatic subdural hemorrhage without loss of consciousness, subsequent encounter: Secondary | ICD-10-CM | POA: Diagnosis not present

## 2021-12-22 DIAGNOSIS — E119 Type 2 diabetes mellitus without complications: Secondary | ICD-10-CM | POA: Diagnosis not present

## 2021-12-22 DIAGNOSIS — J449 Chronic obstructive pulmonary disease, unspecified: Secondary | ICD-10-CM | POA: Diagnosis not present

## 2021-12-22 DIAGNOSIS — K219 Gastro-esophageal reflux disease without esophagitis: Secondary | ICD-10-CM | POA: Diagnosis not present

## 2021-12-22 NOTE — Telephone Encounter (Signed)
Dr. Lamonte Sakai, please see mychart message as an FYI.

## 2021-12-23 DIAGNOSIS — E119 Type 2 diabetes mellitus without complications: Secondary | ICD-10-CM | POA: Diagnosis not present

## 2021-12-23 DIAGNOSIS — R561 Post traumatic seizures: Secondary | ICD-10-CM | POA: Diagnosis not present

## 2021-12-23 DIAGNOSIS — K219 Gastro-esophageal reflux disease without esophagitis: Secondary | ICD-10-CM | POA: Diagnosis not present

## 2021-12-23 DIAGNOSIS — S065X0D Traumatic subdural hemorrhage without loss of consciousness, subsequent encounter: Secondary | ICD-10-CM | POA: Diagnosis not present

## 2021-12-23 DIAGNOSIS — I10 Essential (primary) hypertension: Secondary | ICD-10-CM | POA: Diagnosis not present

## 2021-12-23 DIAGNOSIS — J449 Chronic obstructive pulmonary disease, unspecified: Secondary | ICD-10-CM | POA: Diagnosis not present

## 2021-12-25 DIAGNOSIS — J449 Chronic obstructive pulmonary disease, unspecified: Secondary | ICD-10-CM | POA: Diagnosis not present

## 2021-12-25 DIAGNOSIS — E119 Type 2 diabetes mellitus without complications: Secondary | ICD-10-CM | POA: Diagnosis not present

## 2021-12-25 DIAGNOSIS — I10 Essential (primary) hypertension: Secondary | ICD-10-CM | POA: Diagnosis not present

## 2021-12-25 DIAGNOSIS — R561 Post traumatic seizures: Secondary | ICD-10-CM | POA: Diagnosis not present

## 2021-12-25 DIAGNOSIS — K219 Gastro-esophageal reflux disease without esophagitis: Secondary | ICD-10-CM | POA: Diagnosis not present

## 2021-12-25 DIAGNOSIS — S065X0D Traumatic subdural hemorrhage without loss of consciousness, subsequent encounter: Secondary | ICD-10-CM | POA: Diagnosis not present

## 2021-12-28 DIAGNOSIS — E119 Type 2 diabetes mellitus without complications: Secondary | ICD-10-CM | POA: Diagnosis not present

## 2021-12-28 DIAGNOSIS — J449 Chronic obstructive pulmonary disease, unspecified: Secondary | ICD-10-CM | POA: Diagnosis not present

## 2021-12-28 DIAGNOSIS — S065X0D Traumatic subdural hemorrhage without loss of consciousness, subsequent encounter: Secondary | ICD-10-CM | POA: Diagnosis not present

## 2021-12-28 DIAGNOSIS — I10 Essential (primary) hypertension: Secondary | ICD-10-CM | POA: Diagnosis not present

## 2021-12-28 DIAGNOSIS — K219 Gastro-esophageal reflux disease without esophagitis: Secondary | ICD-10-CM | POA: Diagnosis not present

## 2021-12-28 DIAGNOSIS — R561 Post traumatic seizures: Secondary | ICD-10-CM | POA: Diagnosis not present

## 2021-12-29 ENCOUNTER — Ambulatory Visit (INDEPENDENT_AMBULATORY_CARE_PROVIDER_SITE_OTHER): Payer: Medicare Other | Admitting: Urology

## 2021-12-29 ENCOUNTER — Other Ambulatory Visit: Payer: Self-pay

## 2021-12-29 VITALS — BP 138/77 | HR 91 | Wt 207.4 lb

## 2021-12-29 DIAGNOSIS — E119 Type 2 diabetes mellitus without complications: Secondary | ICD-10-CM | POA: Diagnosis not present

## 2021-12-29 DIAGNOSIS — N138 Other obstructive and reflux uropathy: Secondary | ICD-10-CM | POA: Diagnosis not present

## 2021-12-29 DIAGNOSIS — R561 Post traumatic seizures: Secondary | ICD-10-CM | POA: Diagnosis not present

## 2021-12-29 DIAGNOSIS — N401 Enlarged prostate with lower urinary tract symptoms: Secondary | ICD-10-CM

## 2021-12-29 DIAGNOSIS — R339 Retention of urine, unspecified: Secondary | ICD-10-CM | POA: Diagnosis not present

## 2021-12-29 DIAGNOSIS — S065X0D Traumatic subdural hemorrhage without loss of consciousness, subsequent encounter: Secondary | ICD-10-CM | POA: Diagnosis not present

## 2021-12-29 DIAGNOSIS — K219 Gastro-esophageal reflux disease without esophagitis: Secondary | ICD-10-CM | POA: Diagnosis not present

## 2021-12-29 DIAGNOSIS — J449 Chronic obstructive pulmonary disease, unspecified: Secondary | ICD-10-CM | POA: Diagnosis not present

## 2021-12-29 DIAGNOSIS — I10 Essential (primary) hypertension: Secondary | ICD-10-CM | POA: Diagnosis not present

## 2021-12-29 MED ORDER — CEPHALEXIN 500 MG PO CAPS: 500.0000 mg | ORAL_CAPSULE | Freq: Two times a day (BID) | ORAL | 0 refills | Status: AC

## 2021-12-29 NOTE — Progress Notes (Signed)
H&P  Chief Complaint: Urinary retention, recurrent  History of Present Illness: Here today for follow-up/voiding trial.  2.15.2022: Brent Snow is here today following a visit here last week as well as a visit to urgent care. He was seen for retention on 2.9.2022 and had a catheter placed at that time. He reports that his acute symptoms presented suddenly on 2.8.2022. He denies any associated constipation or decongestant use. He reports that leading up to this episode he was experiencing some urinary hesitancy and a weakened FOS. He was started on tamsulosin.   4.26.2022: He is now off of tamsulosin which was just a short course of medicine.  Overall, he is voiding adequately and denies any significant slow stream.  He states that he has better pressure with urination.  He would like a refill on his sildenafil.   1.3.2023: He was recently hospitalized at [facility] in [place] for an intracranial bleed.  He had a catheter placed due to retention.  He has had it replaced twice with failed voiding trials.  He is here today for T OV.  He is on Flomax twice a day.  He did fail the voiding trial today.  1.24.2023: Here for repeat voiding trial.  On tamsulosin twice a day as well as finasteride.    Past Medical History:  Diagnosis Date   Arthritis    COPD, severe ([place])    pulmologist-  dr byrum   Dyspnea on exertion    prescribed to use O2 via [place]--- per pt he does not use O2 he just take 1-2 puffs of rescue inhaler   Hiatal hernia    History of acute respiratory failure    02-18-2007  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   Hypertension    Hypoxemic respiratory failure, chronic (HCC)    pulmologist-  dr byrum-- prescribed supplemental O2 @ 2L via [place]    OSA on CPAP    and added O2 @ 2L via [place] w/ cpap   Phimosis    S/P balloon dilatation of esophageal stricture    multiple    Type 2 diabetes mellitus ([place])    followed by pcp--  per note uncontrolled w/ last A1c >14 on 12-30-2016    Wears contact lenses     Past Surgical History:  Procedure Laterality Date   BACK SURGERY     CIRCUMCISION N/A 01/27/2017   Procedure: CIRCUMCISION ADULT;  Surgeon: Franchot Gallo, MD;  Location: [facility];  Service: Urology;  Laterality: N/A;   KNEE ARTHROSCOPY Right 2008   LUMBAR SPINE SURGERY  1977  approx.   TONSILLECTOMY AND ADENOIDECTOMY  age 78   TRANSTHORACIC ECHOCARDIOGRAM  09/12/2015   mild LVH,  ef 65-70%,  grade 1 diastolic dysfunction/  mild RAE    Home Medications:  Allergies as of 12/29/2021   No Known Allergies      Medication List        Accurate as of December 29, 2021 11:47 AM. If you have any questions, ask your nurse or doctor.          Albuterol Sulfate 108 (90 Base) MCG/ACT Aepb Commonly known as: PROAIR RESPICLICK Inhale into the lungs.   cyclobenzaprine 10 MG tablet Commonly known as: FLEXERIL Take 1 tablet (10 mg total) by mouth 2 (two) times daily as needed for muscle spasms.   finasteride 5 MG tablet Commonly known as: PROSCAR Take 1 tablet (5 mg total) by mouth daily.   glucose blood test strip Check BS  QD and PRN   Lacosamide 100 MG Tabs Take by mouth.   lisinopril 20 MG tablet Commonly known as: ZESTRIL Take 1 tablet (20 mg total) by mouth daily.   metFORMIN 500 MG tablet Commonly known as: GLUCOPHAGE Take 1 tablet (500 mg total) by mouth daily with breakfast. TAKE 2 TABLETS BY MOUTH TWICE DAILY WITH A MEAL   onetouch ultrasoft lancets   tamsulosin 0.4 MG Caps capsule Commonly known as: FLOMAX Take 1 capsule (0.4 mg total) by mouth 2 (two) times daily.   traMADol 50 MG tablet Commonly known as: ULTRAM Take 1 tablet (50 mg total) by mouth every 6 (six) hours as needed.   Trelegy Ellipta 100-62.5-25 MCG/ACT Aepb Generic drug: Fluticasone-Umeclidin-Vilant Inhale into the lungs.   Trulicity 1.5 OH/6.0VP Sopn Generic drug: Dulaglutide Inject into the skin.        Allergies: No Known  Allergies  Family History  Problem Relation Age of Onset   Asthma Mother    COPD Mother    Cancer Maternal Grandmother        stomach   Early death Father 25       Tractor Accident    Social History:  reports that he quit smoking about 15 years ago. His smoking use included cigarettes. He has a 80.00 pack-year smoking history. He has never used smokeless tobacco. He reports current alcohol use. He reports that he does not use drugs.  ROS: A complete review of systems was performed.  All systems are negative except for pertinent findings as noted.  Physical Exam:  Vital signs in last 24 hours: BP 138/77    Pulse 91    Wt 207 lb 6.4 oz (94.1 kg)    BMI 33.48 kg/m  Constitutional:  Alert and oriented, No acute distress Cardiovascular: Regular rate  Respiratory: Normal respiratory effort GI: Abdomen is soft, nontender, nondistended, no abdominal masses. No CVAT.  Genitourinary: Normal male phallus, testes are descended bilaterally and non-tender and without masses, scrotum is normal in appearance without lesions or masses, perineum is normal on inspection. Lymphatic: No lymphadenopathy Neurologic: Grossly intact, no focal deficits Psychiatric: Normal mood and affect  I have reviewed prior pt notes  I have reviewed notes from referring/previous physicians  He did void at least 260 mL, 300 mL was placed.   Impression/Assessment:  1.  BPH with retention, recurrent  2.  Incomplete bladder emptying, passed voiding trial today  Plan:  1.  Continue dual medical therapy with finasteride and 2 doses of tamsulosin  2.  I sent in a prescription for 3 days of Keflex  3.  Office visit with Sharee Pimple in 1 week to check bladder scan, urinalysis

## 2021-12-29 NOTE — Progress Notes (Signed)
Urological Symptom Review  Patient is experiencing the following symptoms: Get up at night to urinate Stream starts and stops Trouble starting stream Have to strain to urinate Weak stream Erection problems (male only)   Review of Systems  Gastrointestinal (upper)  : Negative for upper GI symptoms  Gastrointestinal (lower) : Negative for lower GI symptoms  Constitutional : Negative for symptoms  Skin: Negative for skin symptoms  Eyes: Negative for eye symptoms  Ear/Nose/Throat : Negative for Ear/Nose/Throat symptoms  Hematologic/Lymphatic: Easy bruising  Cardiovascular : Negative for cardiovascular symptoms  Respiratory : Shortness of breath  Endocrine: Negative for endocrine symptoms  Musculoskeletal: Back pain  Neurological: Dizziness  Psychologic: Negative for psychiatric symptoms

## 2021-12-30 DIAGNOSIS — R569 Unspecified convulsions: Secondary | ICD-10-CM | POA: Diagnosis not present

## 2021-12-30 DIAGNOSIS — G8929 Other chronic pain: Secondary | ICD-10-CM | POA: Diagnosis not present

## 2021-12-30 DIAGNOSIS — M545 Low back pain, unspecified: Secondary | ICD-10-CM | POA: Diagnosis not present

## 2021-12-30 DIAGNOSIS — Z87828 Personal history of other (healed) physical injury and trauma: Secondary | ICD-10-CM | POA: Diagnosis not present

## 2021-12-30 NOTE — Telephone Encounter (Signed)
Thank you for letting me know

## 2021-12-31 ENCOUNTER — Encounter: Payer: Self-pay | Admitting: Emergency Medicine

## 2021-12-31 ENCOUNTER — Other Ambulatory Visit: Payer: Self-pay

## 2021-12-31 ENCOUNTER — Ambulatory Visit (INDEPENDENT_AMBULATORY_CARE_PROVIDER_SITE_OTHER): Payer: Medicare Other | Admitting: Emergency Medicine

## 2021-12-31 VITALS — BP 130/70 | HR 92 | Temp 98.1°F | Ht 65.0 in | Wt 207.4 lb

## 2021-12-31 DIAGNOSIS — R911 Solitary pulmonary nodule: Secondary | ICD-10-CM | POA: Diagnosis not present

## 2021-12-31 DIAGNOSIS — J449 Chronic obstructive pulmonary disease, unspecified: Secondary | ICD-10-CM | POA: Diagnosis not present

## 2021-12-31 NOTE — Patient Instructions (Signed)
We will obtain a copy of your CT scan of the chest that was performed at [facility] 11/27/2021 We will repeat your CT scan of the chest without contrast in June 2023. Please continue your Trelegy once daily as you have been taking it.  Rinse and gargle after using. Keep your ProAir (albuterol) available to use 2 puffs if needed for shortness of breath, chest tightness, wheezing. Follow Dr. Lamonte Sakai in June after your CT scan of the chest so we can review the results together.

## 2021-12-31 NOTE — Progress Notes (Signed)
Subjective:    Patient ID: Brent Snow, male    DOB: [birth year], 81 y.o.   MRN: [id number]  COPD He complains of shortness of breath. There is no cough or wheezing. Pertinent negatives include no ear pain, fever, headaches, postnasal drip, rhinorrhea, sneezing, sore throat or trouble swallowing. His past medical history is significant for COPD.  PMH:  severe COPD, exertional hypoxemia, obstructive sleep apnea, diabetes mellitus. He has chronic exertional hypoxemia, uses oxygen as needed.   ROV 05/02/20 --follow-up visit for 80 year old man with severe COPD, OSA on CPAP and associated chronic hypoxemic respiratory failure.  Most recent information that [facility] has is from 2013.  He needs a new machine > broke months ago and hasn't been able to wear for over 4 months. Poorer sleep, more daytime sleepiness, less energy. Takes naps.   Currently managed on Trelegy, has albuterol which he uses very rarely - once in 6 months. He is fairly active, able to exert, walk the dog. He has lost about 40 lbs (intentional)  ROV 09/01/20 --81 year old gentleman with severe COPD, OSA on CPAP and associated chronic hypoxemic respiratory failure.  We were just able to get him a new CPAP machine, set on 8 cmH2O.  He reports that he has finally gotten his new device - took a long time. He is wearing it reliably, has a full face mask. Tolerating well and wearing reliably. Sometimes gets up to urinate, put it back. He feels that he is sleeping better, better rested during the day.  Currently managed on Trelegy, use albuterol very rarely. He hasn't been flaring - has not had one for several years. He remains able to be active, to exert. He rarely uses his O2  ROV 12/31/21 --Brent Snow is 65 with a history of chronic hypoxemic respiratory failure in the setting of severe COPD and obstructive sleep apnea.  He was unfortunately recently hospitalized in late December at [facility] after a fall resulted in [place].  While he was admitted CXR  11/25/2021 showed a 3.4 cm nodular opacity at the right base prompting CT chest as below. Remains on trelegy. Uses albuterol very rarely. Rarely uses his O2  CT chest 11/26/2021 done at [facility], report available.  This showed a small area of focal fluid attenuation at the right lung base consistent with a small loculated effusion.  Also noted were a 4 mm right middle lobe nodule, 6 mm right upper lobe nodule of unclear significance                                                              Review of Systems  Constitutional:  Negative for fever and unexpected weight change.  HENT:  Negative for congestion, dental problem, ear pain, nosebleeds, postnasal drip, rhinorrhea, sinus pressure, sneezing, sore throat and trouble swallowing.   Eyes:  Negative for redness and itching.  Respiratory:  Positive for shortness of breath. Negative for cough, chest tightness and wheezing.   Cardiovascular:  Negative for palpitations and leg swelling.  Gastrointestinal:  Negative for nausea and vomiting.  Genitourinary:  Negative for dysuria.  Musculoskeletal:  Negative for joint swelling.       Pain and stiffness  Skin:  Negative for rash.  Neurological:  Negative for headaches.  Hematological:  Does not bruise/bleed easily.  Psychiatric/Behavioral:  Negative for dysphoric mood and suicidal ideas. The patient is not nervous/anxious.      Objective:   Physical Exam Vitals:   12/31/21 1557  BP: 130/70  Pulse: 92  Temp: 98.1 F (36.7 C)  TempSrc: Oral  SpO2: 95%  Weight: 207 lb 6.4 oz (94.1 kg)  Height: 5\' 5"  (1.651 m)   Gen: Pleasant, overweight, in no distress,  normal affect  ENT: No lesions,  mouth clear,  oropharynx clear, no postnasal drip  Neck: No JVD, no stridor  Lungs: No use of accessory muscles, distant, no wheezing  Cardiovascular: RRR, heart sounds normal, no murmur or gallops, no peripheral edema  Musculoskeletal: No deformities, no cyanosis or clubbing  Neuro: alert, non  focal  Skin: Warm, no lesions or rashes     Assessment & Plan:  Lung nodule seen on imaging study He had a chronic right lower lobe rounded soft tissue density that has been stable, presumed to be a hamartoma or fluid collection (it was in the area of a superimposed infiltrate on her prior film from 2008).  Suspect that this correlates with the rounded density reported on his CT scan of the chest done at [facility].  Sounds like it is now larger 6 x 2.3 cm with some adjacent atelectasis.  They suspected that it was pleural-based although the original lesion did not appear to be.  I need a copy of the note about films to compare.  The new CT also made mention of small 4-6 mm pulmonary nodules that will need dedicated follow-up.  I will repeat his CT scan of the chest without contrast in 6 months.  COPD (chronic obstructive pulmonary disease) Please continue your Trelegy once daily as you have been taking it.  Rinse and gargle after using. Keep your ProAir (albuterol) available to use 2 puffs if needed for shortness of breath, chest tightness, wheezing.  Baltazar Apo, MD, PhD 12/31/2021, 4:34 PM [facility] [phone] or if no answer [phone]

## 2021-12-31 NOTE — Assessment & Plan Note (Signed)
Please continue your Trelegy once daily as you have been taking it.  Rinse and gargle after using. Keep your ProAir (albuterol) available to use 2 puffs if needed for shortness of breath, chest tightness, wheezing.

## 2021-12-31 NOTE — Assessment & Plan Note (Signed)
He had a chronic right lower lobe rounded soft tissue density that has been stable, presumed to be a hamartoma or fluid collection (it was in the area of a superimposed infiltrate on her prior film from 2008).  Suspect that this correlates with the rounded density reported on his CT scan of the chest done at [facility].  Sounds like it is now larger 6 x 2.3 cm with some adjacent atelectasis.  They suspected that it was pleural-based although the original lesion did not appear to be.  I need a copy of the note about films to compare.  The new CT also made mention of small 4-6 mm pulmonary nodules that will need dedicated follow-up.  I will repeat his CT scan of the chest without contrast in 6 months.

## 2022-01-01 DIAGNOSIS — J449 Chronic obstructive pulmonary disease, unspecified: Secondary | ICD-10-CM | POA: Diagnosis not present

## 2022-01-01 DIAGNOSIS — E119 Type 2 diabetes mellitus without complications: Secondary | ICD-10-CM | POA: Diagnosis not present

## 2022-01-01 DIAGNOSIS — R561 Post traumatic seizures: Secondary | ICD-10-CM | POA: Diagnosis not present

## 2022-01-01 DIAGNOSIS — K219 Gastro-esophageal reflux disease without esophagitis: Secondary | ICD-10-CM | POA: Diagnosis not present

## 2022-01-01 DIAGNOSIS — I10 Essential (primary) hypertension: Secondary | ICD-10-CM | POA: Diagnosis not present

## 2022-01-01 DIAGNOSIS — S065X0D Traumatic subdural hemorrhage without loss of consciousness, subsequent encounter: Secondary | ICD-10-CM | POA: Diagnosis not present

## 2022-01-05 DIAGNOSIS — R561 Post traumatic seizures: Secondary | ICD-10-CM | POA: Diagnosis not present

## 2022-01-05 DIAGNOSIS — E119 Type 2 diabetes mellitus without complications: Secondary | ICD-10-CM | POA: Diagnosis not present

## 2022-01-05 DIAGNOSIS — I10 Essential (primary) hypertension: Secondary | ICD-10-CM | POA: Diagnosis not present

## 2022-01-05 DIAGNOSIS — S065X0D Traumatic subdural hemorrhage without loss of consciousness, subsequent encounter: Secondary | ICD-10-CM | POA: Diagnosis not present

## 2022-01-05 DIAGNOSIS — J449 Chronic obstructive pulmonary disease, unspecified: Secondary | ICD-10-CM | POA: Diagnosis not present

## 2022-01-05 DIAGNOSIS — K219 Gastro-esophageal reflux disease without esophagitis: Secondary | ICD-10-CM | POA: Diagnosis not present

## 2022-01-06 ENCOUNTER — Encounter: Payer: Self-pay | Admitting: Family Medicine

## 2022-01-06 ENCOUNTER — Ambulatory Visit (INDEPENDENT_AMBULATORY_CARE_PROVIDER_SITE_OTHER): Payer: Medicare Other | Admitting: Family Medicine

## 2022-01-06 VITALS — BP 128/67 | HR 75 | Temp 97.4°F | Ht 65.0 in | Wt 204.4 lb

## 2022-01-06 DIAGNOSIS — E1159 Type 2 diabetes mellitus with other circulatory complications: Secondary | ICD-10-CM

## 2022-01-06 DIAGNOSIS — I152 Hypertension secondary to endocrine disorders: Secondary | ICD-10-CM | POA: Diagnosis not present

## 2022-01-06 DIAGNOSIS — Z23 Encounter for immunization: Secondary | ICD-10-CM | POA: Diagnosis not present

## 2022-01-06 DIAGNOSIS — E785 Hyperlipidemia, unspecified: Secondary | ICD-10-CM

## 2022-01-06 DIAGNOSIS — E1169 Type 2 diabetes mellitus with other specified complication: Secondary | ICD-10-CM | POA: Diagnosis not present

## 2022-01-06 NOTE — Progress Notes (Signed)
Subjective: CC:DM PCP: Janora Norlander, DO FUX:NATFTD Brent Snow is a 81 y.o. male presenting to clinic today for:  1. Type 2 Diabetes with hypertension, hyperlipidemia:  Reports compliance with his Trulicity 1.5 mg each week, Pravachol and lisinopril.  He is recovering well from a brain standpoint and was recently released by OT.  He has a couple weeks of PT left over.  His wife seems very pleased with how he has progressed and feels like he is getting back to his baseline.  He has a scan scheduled for tomorrow.  He will also be seeing his urologist tomorrow.  Last eye exam: UTD Last foot exam: needs Last A1c:  Lab Results  Component Value Date   HGBA1C 6.4 07/06/2021   Nephropathy screen indicated?:  On ACE inhibitor Last flu, zoster and/or pneumovax:  Immunization History  Administered Date(s) Administered   Fluad Quad(high Dose 65+) 10/25/2019, 09/01/2020, 11/04/2021   Influenza, High Dose Seasonal PF 10/18/2018   Influenza,inj,quad, With Preservative 10/02/2018   Moderna Sars-Covid-2 Vaccination 12/18/2019   PFIZER(Purple Top)SARS-COV-2 Vaccination 02/15/2020   Pneumococcal Conjugate-13 10/24/2013   Pneumococcal Polysaccharide-23 12/06/2006, 10/11/2012, 11/03/2016   Td 05/17/2006   Zoster Recombinat (Shingrix) 07/06/2021, 01/06/2022    ROS: Denies chest pain, shortness of breath.  He is followed closely by pulmonology    ROS: Per HPI  No Known Allergies Past Medical History:  Diagnosis Date   Arthritis    COPD, severe ([place])    pulmologist-  dr byrum   Dyspnea on exertion    prescribed to use O2 via [place]--- per pt he does not use O2 he just take 1-2 puffs of rescue inhaler   Hiatal hernia    History of acute respiratory failure    02-18-2007  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   Hypertension    Hypoxemic respiratory failure, chronic ([place])    pulmologist-  dr byrum-- prescribed supplemental O2 @ 2L via [place]    OSA on CPAP    and added O2 @ 2L via   w/ cpap   Phimosis    S/P balloon dilatation of esophageal stricture    multiple    Type 2 diabetes mellitus ([place])    followed by pcp--  per note uncontrolled w/ last A1c >14 on 12-30-2016   Wears contact lenses     Current Outpatient Medications:    Albuterol Sulfate (PROAIR RESPICLICK) 322 (90 Base) MCG/ACT AEPB, Inhale into the lungs., Disp: , Rfl:    Dulaglutide (TRULICITY) 1.5 GU/5.4YH SOPN, Inject into the skin., Disp: , Rfl:    finasteride (PROSCAR) 5 MG tablet, Take 1 tablet (5 mg total) by mouth daily., Disp: 30 tablet, Rfl: 11   Fluticasone-Umeclidin-Vilant (TRELEGY ELLIPTA) 100-62.5-25 MCG/ACT AEPB, Inhale into the lungs., Disp: , Rfl:    glucose blood test strip, Check BS QD and PRN, Disp: 100 each, Rfl: 5   Lacosamide 100 MG TABS, Take by mouth., Disp: , Rfl:    Lancets (ONETOUCH ULTRASOFT) lancets, , Disp: , Rfl:    lisinopril (ZESTRIL) 20 MG tablet, Take 1 tablet (20 mg total) by mouth daily., Disp: 90 tablet, Rfl: 3   metFORMIN (GLUCOPHAGE) 500 MG tablet, Take 1 tablet (500 mg total) by mouth daily with breakfast. TAKE 2 TABLETS BY MOUTH TWICE DAILY WITH A MEAL, Disp: 90 tablet, Rfl: 3   pravastatin (PRAVACHOL) 40 MG tablet, Take 40 mg by mouth daily., Disp: , Rfl:    tamsulosin (FLOMAX) 0.4 MG CAPS capsule, Take 1  capsule (0.4 mg total) by mouth 2 (two) times daily., Disp: 180 capsule, Rfl: 3   traMADol (ULTRAM) 50 MG tablet, Take 1 tablet (50 mg total) by mouth every 6 (six) hours as needed., Disp: 20 tablet, Rfl: 5   cyclobenzaprine (FLEXERIL) 10 MG tablet, Take 1 tablet (10 mg total) by mouth 2 (two) times daily as needed for muscle spasms. (Patient not taking: Reported on 12/31/2021), Disp: 20 tablet, Rfl: 0 Social History   Socioeconomic History   Marital status: Married    Spouse name: Not on file   Number of children: 2   Years of education: 15   Highest education level: Some college, no degree  Occupational History   Occupation: Retired    Comment:  Secondary school teacher  Tobacco Use   Smoking status: Former    Packs/day: 2.00    Years: 40.00    Pack years: 80.00    Types: Cigarettes    Quit date: 12/06/2006    Years since quitting: 15.0   Smokeless tobacco: Never  Vaping Use   Vaping Use: Never used  Substance and Sexual Activity   Alcohol use: Yes    Alcohol/week: 0.0 standard drinks    Comment: VERY LITTLE   Drug use: No   Sexual activity: Not on file  Other Topics Concern   Not on file  Social History Narrative   Not on file   Social Determinants of Health   Financial Resource Strain: Low Risk    Difficulty of Paying Living Expenses: Not hard at all  Food Insecurity: No Food Insecurity   Worried About Charity fundraiser in the Last Year: Never true   Ran Out of Food in the Last Year: Never true  Transportation Needs: No Transportation Needs   Lack of Transportation (Medical): No   Lack of Transportation (Non-Medical): No  Physical Activity: Sufficiently Active   Days of Exercise per Week: 3 days   Minutes of Exercise per Session: 60 min  Stress: No Stress Concern Present   Feeling of Stress : Not at all  Social Connections: Socially Integrated   Frequency of Communication with Friends and Family: More than three times a week   Frequency of Social Gatherings with Friends and Family: More than three times a week   Attends Religious Services: More than 4 times per year   Active Member of [employer] or Organizations: Yes   Attends Music therapist: More than 4 times per year   Marital Status: Married  Human resources officer Violence: Not on file   Family History  Problem Relation Age of Onset   Asthma Mother    COPD Mother    Cancer Maternal Grandmother        stomach   Early death Father 56       Tractor Accident    Objective: Office vital signs reviewed. BP 128/67    Pulse 75    Temp (!) 97.4 F (36.3 C)    Ht 5\' 5"  (1.651 Brent)    Wt 204 lb 6.4 oz (92.7 kg)    SpO2 94%    BMI 34.01  kg/Brent   Physical Examination:  General: Awake, alert, nontoxic-appearing elderly male, No acute distress HEENT: No carotid bruits.  Moist mucous membranes. Cardio: regular rate and rhythm, S1S2 heard, no murmurs appreciated Pulm: clear to auscultation bilaterally, no wheezes, rhonchi or rales; normal work of breathing on room air MSK: Slow gait and slightly hunched station.  Ambulating independently Neuro: PERRLA, EOMI.  Alert and oriented x3  Assessment/ Plan: 81 y.o. male   Type 2 diabetes mellitus with other specified complication, without long-term current use of insulin ([place])  Hypertension associated with diabetes ([place])  Hyperlipidemia associated with type 2 diabetes mellitus ([place])  Need for shingles vaccine  Sugar was checked in December with A1c at 6.4.  No changes needed.  Blood pressure well controlled.  Continue current regimen. Continue statin.  Not yet due for fasting lipid.   Shingles vaccination administered. We will see each other back again in 6 months and plan for fasting labs with complete physical and foot exam  Orders Placed This Encounter  Procedures   Varicella-zoster vaccine IM (Shingrix)   No orders of the defined types were placed in this encounter.    Janora Norlander, DO [place] [phone]

## 2022-01-07 ENCOUNTER — Other Ambulatory Visit: Payer: Self-pay

## 2022-01-07 ENCOUNTER — Ambulatory Visit (INDEPENDENT_AMBULATORY_CARE_PROVIDER_SITE_OTHER): Payer: Medicare Other | Admitting: Physician Assistant

## 2022-01-07 VITALS — BP 147/80 | HR 79

## 2022-01-07 DIAGNOSIS — N138 Other obstructive and reflux uropathy: Secondary | ICD-10-CM

## 2022-01-07 DIAGNOSIS — S065X9A Traumatic subdural hemorrhage with loss of consciousness of unspecified duration, initial encounter: Secondary | ICD-10-CM | POA: Diagnosis not present

## 2022-01-07 DIAGNOSIS — S065XAA Traumatic subdural hemorrhage with loss of consciousness status unknown, initial encounter: Secondary | ICD-10-CM | POA: Diagnosis not present

## 2022-01-07 DIAGNOSIS — N401 Enlarged prostate with lower urinary tract symptoms: Secondary | ICD-10-CM

## 2022-01-07 DIAGNOSIS — R339 Retention of urine, unspecified: Secondary | ICD-10-CM

## 2022-01-07 LAB — BLADDER SCAN AMB NON-IMAGING: Scan Result: 42

## 2022-01-07 NOTE — Progress Notes (Signed)
Assessment: 1. Urinary retention   2. BPH with urinary obstruction     Plan: Patient is advised to continue daily finasteride and tamsulosin twice daily.  He will keep appt with Dr. Diona Fanti as scheduled on 4/25 with PVR and urinalysis.  Chief Complaint: No chief complaint on file. FU retention  HPI: Patient presents for PVR and urinalysis post voiding trial on 12/29/2021  2.15.2022: Brent Snow is here today following a visit here last week as well as a visit to urgent care. He was seen for retention on 2.9.2022 and had a catheter placed at that time. He reports that his acute symptoms presented suddenly on 2.8.2022. He denies any associated constipation or decongestant use. He reports that leading up to this episode he was experiencing some urinary hesitancy and a weakened FOS. He was started on tamsulosin.   4.26.2022: He is now off of tamsulosin which was just a short course of medicine.  Overall, he is voiding adequately and denies any significant slow stream.  He states that he has better pressure with urination.  He would like a refill on his sildenafil.   1.3.2023: He was recently hospitalized at [facility] in [place] for an intracranial bleed.  He had a catheter placed due to retention.  He has had it replaced twice with failed voiding trials.  He is here today for T OV.  He is on Flomax twice a day.  He did fail the voiding trial today.  1.24.2023: Here for repeat voiding trial.  On tamsulosin twice a day as well as finasteride.  01/07/22 Brent Snow is a 81 y.o. male who presents for continued evaluation of BPH and urinary retention and states that he has been doing well since he passed voiding trial on 12/29/2021.  He has increased tamsulosin to twice daily and is tolerating well.  He remains on finasteride.  He continues to have occasional urgency and weakness of stream.  He voids only once per night.  IPSS score = 5 Q OL = 3 Urine is clear PVR- 49ml  Portions of the  above documentation were copied from a prior visit for review purposes only.  Allergies: No Known Allergies  PMH: Past Medical History:  Diagnosis Date   Arthritis    COPD, severe ([place])    pulmologist-  dr byrum   Dyspnea on exertion    prescribed to use O2 via [place]--- per pt he does not use O2 he just take 1-2 puffs of rescue inhaler   Hiatal hernia    History of acute respiratory failure    02-18-2007  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   Hypertension    Hypoxemic respiratory failure, chronic ([facility])    pulmologist-  dr byrum-- prescribed supplemental O2 @ 2L via [place]    OSA on CPAP    and added O2 @ 2L via  w/ cpap   Phimosis    S/P balloon dilatation of esophageal stricture    multiple    Type 2 diabetes mellitus ([place])    followed by pcp--  per note uncontrolled w/ last A1c >14 on 12-30-2016   Wears contact lenses     PSH: Past Surgical History:  Procedure Laterality Date   BACK SURGERY     CIRCUMCISION N/A 01/27/2017   Procedure: CIRCUMCISION ADULT;  Surgeon: Franchot Gallo, MD;  Location: [facility];  Service: Urology;  Laterality: N/A;   KNEE ARTHROSCOPY Right 2008   LUMBAR SPINE SURGERY  1977  approx.  TONSILLECTOMY AND ADENOIDECTOMY  age 89   TRANSTHORACIC ECHOCARDIOGRAM  09/12/2015   mild LVH,  ef 65-70%,  grade 1 diastolic dysfunction/  mild RAE    SH: Social History   Tobacco Use   Smoking status: Former    Packs/day: 2.00    Years: 40.00    Pack years: 80.00    Types: Cigarettes    Quit date: 12/06/2006    Years since quitting: 15.0   Smokeless tobacco: Never  Vaping Use   Vaping Use: Never used  Substance Use Topics   Alcohol use: Yes    Alcohol/week: 0.0 standard drinks    Comment: VERY LITTLE   Drug use: No    ROS: Constitutional:  Negative for fever, chills, weight loss CV: Negative for chest pain, previous MI, hypertension Respiratory:  Negative for shortness of breath, wheezing, sleep apnea, frequent  cough GI:  Negative for nausea, vomiting, bloody stool, GERD  PE: BP (!) 147/80    Pulse 79  GENERAL APPEARANCE:  Well appearing, well developed, well nourished, NAD HEENT:  Atraumatic, normocephalic NECK:  Supple. Trachea midline ABDOMEN:  Soft, non-tender EXTREMITIES:  Moves all extremities well, without clubbing, cyanosis, or edema NEUROLOGIC:  Alert and oriented x 3, normal gait, CN II-XII grossly intact MENTAL STATUS:  appropriate SKIN:  Warm, dry, and intact   Results: Laboratory Data: Lab Results  Component Value Date   WBC 6.4 06/30/2020   HGB 16.9 06/30/2020   HCT 51.0 06/30/2020   MCV 91 06/30/2020   PLT 154 06/30/2020    Lab Results  Component Value Date   CREATININE 0.94 07/09/2021    Lab Results  Component Value Date   HGBA1C 6.4 07/06/2021    Urinalysis    Component Value Date/Time   APPEARANCEUR Clear 03/31/2021 1109   GLUCOSEU Negative 03/31/2021 1109   BILIRUBINUR Negative 03/31/2021 1109   KETONESUR negative 01/14/2021 2001   PROTEINUR Negative 03/31/2021 1109   UROBILINOGEN 0.2 01/14/2021 2001   NITRITE Negative 03/31/2021 1109   LEUKOCYTESUR Negative 03/31/2021 1109    Lab Results  Component Value Date   LABMICR Comment 03/31/2021   WBCUA 0-5 01/20/2021   LABEPIT 0-10 01/20/2021   MUCUS Present 01/20/2021   BACTERIA None seen 01/20/2021    Pertinent Imaging:  Results for orders placed during the hospital encounter of 02/18/07  DG Abd 1 View  Narrative Clinical Data: Evaluate feeding tube. ABDOMEN - 1 VIEW: Findings: Feeding tube is in the distal esophagus. Nonobstructive bowel gas.  Impression Feeding tube distal esophagus.  Provider: Janene Harvey  No results found for this or any previous visit.  No results found for this or any previous visit.  No results found for this or any previous visit.  No results found for this or any previous visit.  No results found for this or any previous visit.  No results found  for this or any previous visit.  No results found for this or any previous visit.  Results for orders placed or performed in visit on 01/07/22 (from the past 24 hour(s))  BLADDER SCAN AMB NON-IMAGING   Collection Time: 01/07/22 11:05 AM  Result Value Ref Range   Scan Result 42

## 2022-01-07 NOTE — Progress Notes (Signed)
post void residual=42  Urological Symptom Review  Patient is experiencing the following symptoms: Frequent urination Hard to postpone urination Get up at night to urinate Leakage of urine Weak stream Erection problems (male only)   Review of Systems  Gastrointestinal (upper)  : Negative for upper GI symptoms  Gastrointestinal (lower) : Negative for lower GI symptoms  Constitutional : Negative for symptoms  Skin: Negative for skin symptoms  Eyes: Negative for eye symptoms  Ear/Nose/Throat : Negative for Ear/Nose/Throat symptoms  Hematologic/Lymphatic: Easy bruising  Cardiovascular : Negative for cardiovascular symptoms  Respiratory : Shortness of breath  Endocrine: Negative for endocrine symptoms  Musculoskeletal: Back pain  Neurological: Negative for neurological symptoms  Psychologic: Negative for psychiatric symptoms

## 2022-01-08 LAB — URINALYSIS, ROUTINE W REFLEX MICROSCOPIC
Bilirubin, UA: NEGATIVE
Glucose, UA: NEGATIVE
Ketones, UA: NEGATIVE
Leukocytes,UA: NEGATIVE
Nitrite, UA: NEGATIVE
RBC, UA: NEGATIVE
Specific Gravity, UA: 1.02 (ref 1.005–1.030)
Urobilinogen, Ur: 1 mg/dL (ref 0.2–1.0)
pH, UA: 5.5 (ref 5.0–7.5)

## 2022-01-09 DIAGNOSIS — Z7951 Long term (current) use of inhaled steroids: Secondary | ICD-10-CM | POA: Diagnosis not present

## 2022-01-09 DIAGNOSIS — K219 Gastro-esophageal reflux disease without esophagitis: Secondary | ICD-10-CM | POA: Diagnosis not present

## 2022-01-09 DIAGNOSIS — R561 Post traumatic seizures: Secondary | ICD-10-CM | POA: Diagnosis not present

## 2022-01-09 DIAGNOSIS — Z466 Encounter for fitting and adjustment of urinary device: Secondary | ICD-10-CM | POA: Diagnosis not present

## 2022-01-09 DIAGNOSIS — E785 Hyperlipidemia, unspecified: Secondary | ICD-10-CM | POA: Diagnosis not present

## 2022-01-09 DIAGNOSIS — Z7984 Long term (current) use of oral hypoglycemic drugs: Secondary | ICD-10-CM | POA: Diagnosis not present

## 2022-01-09 DIAGNOSIS — Z87891 Personal history of nicotine dependence: Secondary | ICD-10-CM | POA: Diagnosis not present

## 2022-01-09 DIAGNOSIS — G935 Compression of brain: Secondary | ICD-10-CM | POA: Diagnosis not present

## 2022-01-09 DIAGNOSIS — N400 Enlarged prostate without lower urinary tract symptoms: Secondary | ICD-10-CM | POA: Diagnosis not present

## 2022-01-09 DIAGNOSIS — Z7985 Long-term (current) use of injectable non-insulin antidiabetic drugs: Secondary | ICD-10-CM | POA: Diagnosis not present

## 2022-01-09 DIAGNOSIS — G4733 Obstructive sleep apnea (adult) (pediatric): Secondary | ICD-10-CM | POA: Diagnosis not present

## 2022-01-09 DIAGNOSIS — S4352XD Sprain of left acromioclavicular joint, subsequent encounter: Secondary | ICD-10-CM | POA: Diagnosis not present

## 2022-01-09 DIAGNOSIS — E119 Type 2 diabetes mellitus without complications: Secondary | ICD-10-CM | POA: Diagnosis not present

## 2022-01-09 DIAGNOSIS — J449 Chronic obstructive pulmonary disease, unspecified: Secondary | ICD-10-CM | POA: Diagnosis not present

## 2022-01-09 DIAGNOSIS — Z9181 History of falling: Secondary | ICD-10-CM | POA: Diagnosis not present

## 2022-01-09 DIAGNOSIS — S065X0D Traumatic subdural hemorrhage without loss of consciousness, subsequent encounter: Secondary | ICD-10-CM | POA: Diagnosis not present

## 2022-01-09 DIAGNOSIS — I10 Essential (primary) hypertension: Secondary | ICD-10-CM | POA: Diagnosis not present

## 2022-01-13 DIAGNOSIS — K219 Gastro-esophageal reflux disease without esophagitis: Secondary | ICD-10-CM | POA: Diagnosis not present

## 2022-01-13 DIAGNOSIS — S065X0D Traumatic subdural hemorrhage without loss of consciousness, subsequent encounter: Secondary | ICD-10-CM | POA: Diagnosis not present

## 2022-01-13 DIAGNOSIS — E119 Type 2 diabetes mellitus without complications: Secondary | ICD-10-CM | POA: Diagnosis not present

## 2022-01-13 DIAGNOSIS — J449 Chronic obstructive pulmonary disease, unspecified: Secondary | ICD-10-CM | POA: Diagnosis not present

## 2022-01-13 DIAGNOSIS — R561 Post traumatic seizures: Secondary | ICD-10-CM | POA: Diagnosis not present

## 2022-01-13 DIAGNOSIS — I10 Essential (primary) hypertension: Secondary | ICD-10-CM | POA: Diagnosis not present

## 2022-01-20 DIAGNOSIS — Z20822 Contact with and (suspected) exposure to covid-19: Secondary | ICD-10-CM | POA: Diagnosis not present

## 2022-01-21 ENCOUNTER — Telehealth: Payer: Self-pay

## 2022-01-21 DIAGNOSIS — S065X0D Traumatic subdural hemorrhage without loss of consciousness, subsequent encounter: Secondary | ICD-10-CM | POA: Diagnosis not present

## 2022-01-21 DIAGNOSIS — R561 Post traumatic seizures: Secondary | ICD-10-CM | POA: Diagnosis not present

## 2022-01-21 DIAGNOSIS — J449 Chronic obstructive pulmonary disease, unspecified: Secondary | ICD-10-CM | POA: Diagnosis not present

## 2022-01-21 DIAGNOSIS — K219 Gastro-esophageal reflux disease without esophagitis: Secondary | ICD-10-CM | POA: Diagnosis not present

## 2022-01-21 DIAGNOSIS — E119 Type 2 diabetes mellitus without complications: Secondary | ICD-10-CM | POA: Diagnosis not present

## 2022-01-21 DIAGNOSIS — I10 Essential (primary) hypertension: Secondary | ICD-10-CM | POA: Diagnosis not present

## 2022-01-21 NOTE — Telephone Encounter (Signed)
CT Chest CD from [facility] received and placed in Dr. Agustina Caroli review folder.

## 2022-01-26 DIAGNOSIS — K219 Gastro-esophageal reflux disease without esophagitis: Secondary | ICD-10-CM | POA: Diagnosis not present

## 2022-01-26 DIAGNOSIS — I10 Essential (primary) hypertension: Secondary | ICD-10-CM | POA: Diagnosis not present

## 2022-01-26 DIAGNOSIS — E119 Type 2 diabetes mellitus without complications: Secondary | ICD-10-CM | POA: Diagnosis not present

## 2022-01-26 DIAGNOSIS — R561 Post traumatic seizures: Secondary | ICD-10-CM | POA: Diagnosis not present

## 2022-01-26 DIAGNOSIS — S065X0D Traumatic subdural hemorrhage without loss of consciousness, subsequent encounter: Secondary | ICD-10-CM | POA: Diagnosis not present

## 2022-01-26 DIAGNOSIS — J449 Chronic obstructive pulmonary disease, unspecified: Secondary | ICD-10-CM | POA: Diagnosis not present

## 2022-03-29 DIAGNOSIS — Z20822 Contact with and (suspected) exposure to covid-19: Secondary | ICD-10-CM | POA: Diagnosis not present

## 2022-03-29 NOTE — Progress Notes (Signed)
History of Present Illness:  ? ?2.15.2022: Brent Snow is here today following a visit here last week as well as a visit to urgent care. He was seen for retention on 2.9.2022 and had a catheter placed at that time. He reports that his acute symptoms presented suddenly on 2.8.2022. He denies any associated constipation or decongestant use. He reports that leading up to this episode he was experiencing some urinary hesitancy and a weakened FOS. He was started on tamsulosin. ?  ?4.26.2022: He is now off of tamsulosin which was just a short course of medicine.  Overall, he is voiding adequately and denies any significant slow stream.  He states that he has better pressure with urination.  He would like a refill on his sildenafil. ?  ?1.3.2023: He was recently hospitalized at [facility] in [place] for an intracranial bleed.  He had a catheter placed due to retention.  He has had it replaced twice with failed voiding trials.  He is here today for T OV.  He is on Flomax twice a day.  He did fail the voiding trial today. ? ?1.24.2023: Here for repeat voiding trial.  On tamsulosin twice a day as well as finasteride.  No significant side effects from these medications.  He would like to get back on sildenafil. ?  ? ?Past Medical History:  ?Diagnosis Date  ? Arthritis   ? COPD, severe ([place])   ? pulmologist-  dr byrum  ? Dyspnea on exertion   ? prescribed to use O2 via [place]--- per pt he does not use O2 he just take 1-2 puffs of rescue inhaler  ? Hiatal hernia   ? History of acute respiratory failure   ? 02-18-2007  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  ? Hypertension   ? Hypoxemic respiratory failure, chronic ([place])   ? pulmologist-  dr byrum-- prescribed supplemental O2 @ 2L via [place]   ? OSA on CPAP   ? and added O2 @ 2L via [place] w/ cpap  ? Phimosis   ? S/P balloon dilatation of esophageal stricture   ? multiple   ? Type 2 diabetes mellitus ([place])   ? followed by pcp--  per note uncontrolled w/ last A1c >14 on 12-30-2016  ?  Wears contact lenses   ? ? ?Past Surgical History:  ?Procedure Laterality Date  ? BACK SURGERY    ? CIRCUMCISION N/A 01/27/2017  ? Procedure: CIRCUMCISION ADULT;  Surgeon: Franchot Gallo, MD;  Location: [facility];  Service: Urology;  Laterality: N/A;  ? KNEE ARTHROSCOPY Right 2008  ? LUMBAR SPINE SURGERY  1977  approx.  ? TONSILLECTOMY AND ADENOIDECTOMY  age 24  ? TRANSTHORACIC ECHOCARDIOGRAM  09/12/2015  ? mild LVH,  ef 65-70%,  grade 1 diastolic dysfunction/  mild RAE  ? ? ?Home Medications:  ?Allergies as of 03/30/2022   ?No Known Allergies ?  ? ?  ?Medication List  ?  ? ?  ? Accurate as of March 29, 2022  8:01 PM. If you have any questions, ask your nurse or doctor.  ?  ?  ? ?  ? ?Albuterol Sulfate 108 (90 Base) MCG/ACT Aepb ?Commonly known as: PROAIR RESPICLICK ?Inhale into the lungs. ?  ?cyclobenzaprine 10 MG tablet ?Commonly known as: FLEXERIL ?Take 1 tablet (10 mg total) by mouth 2 (two) times daily as needed for muscle spasms. ?  ?finasteride 5 MG tablet ?Commonly known as: PROSCAR ?Take 1 tablet (5 mg total) by mouth daily. ?  ?  glucose blood test strip ?Check BS QD and PRN ?  ?Lacosamide 100 MG Tabs ?Take by mouth. ?  ?lisinopril 20 MG tablet ?Commonly known as: ZESTRIL ?Take 1 tablet (20 mg total) by mouth daily. ?  ?metFORMIN 500 MG tablet ?Commonly known as: GLUCOPHAGE ?Take 1 tablet (500 mg total) by mouth daily with breakfast. TAKE 2 TABLETS BY MOUTH TWICE DAILY WITH A MEAL ?  ?onetouch ultrasoft lancets ?  ?pravastatin 40 MG tablet ?Commonly known as: PRAVACHOL ?Take 40 mg by mouth daily. ?  ?tamsulosin 0.4 MG Caps capsule ?Commonly known as: FLOMAX ?Take 1 capsule (0.4 mg total) by mouth 2 (two) times daily. ?  ?traMADol 50 MG tablet ?Commonly known as: ULTRAM ?Take 1 tablet (50 mg total) by mouth every 6 (six) hours as needed. ?  ?Trelegy Ellipta 100-62.5-25 MCG/ACT Aepb ?Generic drug: Fluticasone-Umeclidin-Vilant ?Inhale into the lungs. ?  ?Trulicity 1.5 BH/4.1PF Sopn ?Generic  drug: Dulaglutide ?Inject into the skin. ?  ? ?  ? ? ?Allergies: No Known Allergies ? ?Family History  ?Problem Relation Age of Onset  ? Asthma Mother   ? COPD Mother   ? Cancer Maternal Grandmother   ?     stomach  ? Early death Father 56  ?     Tractor Accident  ? ? ?Social History:  reports that he quit smoking about 15 years ago. His smoking use included cigarettes. He has a 80.00 pack-year smoking history. He has never used smokeless tobacco. He reports current alcohol use. He reports that he does not use drugs. ? ?ROS: ?A complete review of systems was performed.  All systems are negative except for pertinent findings as noted. ? ?Physical Exam:  ?Vital signs in last 24 hours: ?There were no vitals taken for this visit. ?Constitutional:  Alert and oriented, No acute distress ?Cardiovascular: Regular rate  ?Respiratory: Normal respiratory effort ?Neurologic: Grossly intact, no focal deficits ?Psychiatric: Normal mood and affect ? ?I have reviewed prior pt notes ? ?I have reviewed urinalysis results ? ?I have independently reviewed prior imaging--bladder scan volume 27 mL. ? ?I have reviewed prior PSA results ? ? ? ?Impression/Assessment:  ?1.  BPH with retention, now emptying out well on dual medical therapy ? ?2.  ED, he wants to get back on sildenafil ? ?Plan:  ?1.  Is okay to back down to 1 tamsulosin, the nighttime dose.  Continue finasteride ? ?2.  Sildenafil prescribed ? ?3.  I will see back in 1 year for recheck ? ?

## 2022-03-30 ENCOUNTER — Encounter: Payer: Self-pay | Admitting: Urology

## 2022-03-30 ENCOUNTER — Ambulatory Visit (INDEPENDENT_AMBULATORY_CARE_PROVIDER_SITE_OTHER): Payer: Medicare Other | Admitting: Urology

## 2022-03-30 VITALS — BP 143/80 | HR 73

## 2022-03-30 DIAGNOSIS — N138 Other obstructive and reflux uropathy: Secondary | ICD-10-CM

## 2022-03-30 DIAGNOSIS — N529 Male erectile dysfunction, unspecified: Secondary | ICD-10-CM

## 2022-03-30 DIAGNOSIS — R339 Retention of urine, unspecified: Secondary | ICD-10-CM

## 2022-03-30 DIAGNOSIS — N401 Enlarged prostate with lower urinary tract symptoms: Secondary | ICD-10-CM

## 2022-03-30 LAB — URINALYSIS, ROUTINE W REFLEX MICROSCOPIC
Bilirubin, UA: NEGATIVE
Glucose, UA: NEGATIVE
Ketones, UA: NEGATIVE
Nitrite, UA: NEGATIVE
Protein,UA: NEGATIVE
RBC, UA: NEGATIVE
Specific Gravity, UA: 1.025 (ref 1.005–1.030)
Urobilinogen, Ur: 0.2 mg/dL (ref 0.2–1.0)
pH, UA: 5.5 (ref 5.0–7.5)

## 2022-03-30 LAB — MICROSCOPIC EXAMINATION
RBC, Urine: NONE SEEN /hpf (ref 0–2)
Renal Epithel, UA: NONE SEEN /hpf

## 2022-04-05 DIAGNOSIS — L570 Actinic keratosis: Secondary | ICD-10-CM | POA: Diagnosis not present

## 2022-04-05 DIAGNOSIS — Z85828 Personal history of other malignant neoplasm of skin: Secondary | ICD-10-CM | POA: Diagnosis not present

## 2022-04-05 DIAGNOSIS — L820 Inflamed seborrheic keratosis: Secondary | ICD-10-CM | POA: Diagnosis not present

## 2022-04-05 DIAGNOSIS — L814 Other melanin hyperpigmentation: Secondary | ICD-10-CM | POA: Diagnosis not present

## 2022-04-05 DIAGNOSIS — D225 Melanocytic nevi of trunk: Secondary | ICD-10-CM | POA: Diagnosis not present

## 2022-04-05 DIAGNOSIS — I781 Nevus, non-neoplastic: Secondary | ICD-10-CM | POA: Diagnosis not present

## 2022-04-05 DIAGNOSIS — Z85820 Personal history of malignant melanoma of skin: Secondary | ICD-10-CM | POA: Diagnosis not present

## 2022-04-05 DIAGNOSIS — D485 Neoplasm of uncertain behavior of skin: Secondary | ICD-10-CM | POA: Diagnosis not present

## 2022-04-05 DIAGNOSIS — L821 Other seborrheic keratosis: Secondary | ICD-10-CM | POA: Diagnosis not present

## 2022-04-09 DIAGNOSIS — M5116 Intervertebral disc disorders with radiculopathy, lumbar region: Secondary | ICD-10-CM | POA: Diagnosis not present

## 2022-04-09 DIAGNOSIS — G8929 Other chronic pain: Secondary | ICD-10-CM | POA: Diagnosis not present

## 2022-04-09 DIAGNOSIS — Z981 Arthrodesis status: Secondary | ICD-10-CM | POA: Diagnosis not present

## 2022-04-09 DIAGNOSIS — M4326 Fusion of spine, lumbar region: Secondary | ICD-10-CM | POA: Diagnosis not present

## 2022-04-09 DIAGNOSIS — Z9889 Other specified postprocedural states: Secondary | ICD-10-CM | POA: Diagnosis not present

## 2022-04-09 DIAGNOSIS — M5442 Lumbago with sciatica, left side: Secondary | ICD-10-CM | POA: Diagnosis not present

## 2022-04-09 DIAGNOSIS — M5416 Radiculopathy, lumbar region: Secondary | ICD-10-CM | POA: Diagnosis not present

## 2022-04-09 DIAGNOSIS — M4726 Other spondylosis with radiculopathy, lumbar region: Secondary | ICD-10-CM | POA: Diagnosis not present

## 2022-04-09 DIAGNOSIS — M2578 Osteophyte, vertebrae: Secondary | ICD-10-CM | POA: Diagnosis not present

## 2022-04-09 DIAGNOSIS — M48061 Spinal stenosis, lumbar region without neurogenic claudication: Secondary | ICD-10-CM | POA: Diagnosis not present

## 2022-04-30 ENCOUNTER — Telehealth: Payer: Self-pay | Admitting: Emergency Medicine

## 2022-04-30 MED ORDER — ALBUTEROL SULFATE 108 (90 BASE) MCG/ACT IN AEPB: 2.0000 | INHALATION_SPRAY | Freq: Four times a day (QID) | RESPIRATORY_TRACT | 5 refills | Status: DC | PRN

## 2022-04-30 NOTE — Telephone Encounter (Signed)
Spoke with the pt and notified rx was sent  Nothing further needed 

## 2022-04-30 NOTE — Telephone Encounter (Signed)
ATC LVMTCB refill sent in.

## 2022-05-20 ENCOUNTER — Other Ambulatory Visit: Payer: Self-pay | Admitting: Family Medicine

## 2022-05-20 DIAGNOSIS — E1169 Type 2 diabetes mellitus with other specified complication: Secondary | ICD-10-CM

## 2022-05-24 ENCOUNTER — Ambulatory Visit (HOSPITAL_COMMUNITY)
Admission: RE | Admit: 2022-05-24 | Discharge: 2022-05-24 | Disposition: A | Discharge status: Home / Self Care | Payer: Medicare Other | Source: Ambulatory Visit | Attending: Emergency Medicine | Admitting: Emergency Medicine

## 2022-05-24 DIAGNOSIS — R918 Other nonspecific abnormal finding of lung field: Secondary | ICD-10-CM | POA: Diagnosis not present

## 2022-05-24 DIAGNOSIS — J432 Centrilobular emphysema: Secondary | ICD-10-CM | POA: Diagnosis not present

## 2022-05-24 DIAGNOSIS — R911 Solitary pulmonary nodule: Secondary | ICD-10-CM | POA: Insufficient documentation

## 2022-06-15 DIAGNOSIS — S065XAA Traumatic subdural hemorrhage with loss of consciousness status unknown, initial encounter: Secondary | ICD-10-CM | POA: Diagnosis not present

## 2022-06-15 DIAGNOSIS — W19XXXA Unspecified fall, initial encounter: Secondary | ICD-10-CM | POA: Diagnosis not present

## 2022-06-15 DIAGNOSIS — R569 Unspecified convulsions: Secondary | ICD-10-CM | POA: Diagnosis not present

## 2022-06-16 DIAGNOSIS — E1100 Type 2 diabetes mellitus with hyperosmolarity without nonketotic hyperglycemic-hyperosmolar coma (NKHHC): Secondary | ICD-10-CM | POA: Diagnosis not present

## 2022-06-16 DIAGNOSIS — Z9889 Other specified postprocedural states: Secondary | ICD-10-CM | POA: Diagnosis not present

## 2022-06-16 DIAGNOSIS — M5416 Radiculopathy, lumbar region: Secondary | ICD-10-CM | POA: Diagnosis not present

## 2022-06-22 DIAGNOSIS — S0990XA Unspecified injury of head, initial encounter: Secondary | ICD-10-CM | POA: Diagnosis not present

## 2022-06-24 DIAGNOSIS — R569 Unspecified convulsions: Secondary | ICD-10-CM | POA: Diagnosis not present

## 2022-07-08 DIAGNOSIS — Z029 Encounter for administrative examinations, unspecified: Secondary | ICD-10-CM

## 2022-07-14 ENCOUNTER — Ambulatory Visit: Payer: Medicare Other | Admitting: Emergency Medicine

## 2022-07-15 ENCOUNTER — Encounter: Payer: Self-pay | Admitting: Emergency Medicine

## 2022-07-15 ENCOUNTER — Ambulatory Visit
Admission: RE | Admit: 2022-07-15 | Discharge: 2022-07-15 | Disposition: A | Discharge status: Home / Self Care | Payer: Self-pay | Source: Ambulatory Visit | Attending: Emergency Medicine | Admitting: Emergency Medicine

## 2022-07-15 ENCOUNTER — Ambulatory Visit (INDEPENDENT_AMBULATORY_CARE_PROVIDER_SITE_OTHER): Payer: Medicare Other | Admitting: Emergency Medicine

## 2022-07-15 VITALS — BP 124/76 | HR 63 | Temp 97.8°F | Wt 192.5 lb

## 2022-07-15 DIAGNOSIS — J449 Chronic obstructive pulmonary disease, unspecified: Secondary | ICD-10-CM

## 2022-07-15 DIAGNOSIS — R911 Solitary pulmonary nodule: Secondary | ICD-10-CM | POA: Diagnosis not present

## 2022-07-15 DIAGNOSIS — J9611 Chronic respiratory failure with hypoxia: Secondary | ICD-10-CM

## 2022-07-15 NOTE — Assessment & Plan Note (Signed)
He has a right lower lobe mass adjacent to the pleura, consistent with a hamartoma or benign tumor.  Has been stable at least going back to 2014, possibly to 2008.  He also has right upper lobe and left upper lobe 4-5 mm nodules that have been stable on serial films.  There is a small amount of right upper lobe tree-in-bud, some 1-2 mm left upper lobe nodules that are new on the most recent scan.  I think we can continue to follow him annually.  Next scan in 05/2023

## 2022-07-15 NOTE — Patient Instructions (Addendum)
Please continue your Trelegy 1 inhalation once daily.  Rinse and gargle after using. Keep albuterol available to use 2 puffs up to every 4 hours if needed for shortness of breath, chest tightness, wheezing.  We reviewed your CT scan of the chest today.  We will plan to repeat your CT scan without contrast in June 2024 Keep your oxygen available to use 2 L/min with heavy exertion.  Our goal is to keep your saturations > 90%. Get the flu shot this fall and look for updates regarding any COVID-19 boosters this fall. Follow with Dr Lamonte Sakai in 6 months or sooner if you have any problems

## 2022-07-15 NOTE — Assessment & Plan Note (Signed)
Please continue your Trelegy 1 inhalation once daily.  Rinse and gargle after using. Keep albuterol available to use 2 puffs up to every 4 hours if needed for shortness of breath, chest tightness, wheezing.  Get the flu shot this fall and look for updates regarding any COVID-19 boosters this fall. Follow with Dr Lamonte Sakai in 6 months or sooner if you have any problems

## 2022-07-15 NOTE — Assessment & Plan Note (Signed)
Keep your oxygen available to use 2 L/min with heavy exertion.  Our goal is to keep your saturations > 90%.

## 2022-07-15 NOTE — Progress Notes (Signed)
Subjective:    Patient ID: Brent Snow, male    DOB: 1941-04-20, 81 y.o.   MRN: [id number]  COPD He complains of shortness of breath. There is no cough or wheezing. Pertinent negatives include no ear pain, fever, headaches, postnasal drip, rhinorrhea, sneezing, sore throat or trouble swallowing. His past medical history is significant for COPD.   PMH:  severe COPD, exertional hypoxemia, obstructive sleep apnea, diabetes mellitus. He has chronic exertional hypoxemia, uses oxygen as needed.   ROV 07/15/22 --follow-up visit for pleasant 81 year old gentleman with chronic hypoxemic respiratory failure due to very severe COPD.  He also has obstructive sleep apnea, recent fall and SDH.  He has a chronic mixed density right lower lobe rounded mass that we have followed with serial imaging as well as 5 mm left lower lobe, 4 mm right upper lobe nodules that have been stable on CT going back to 2014.  He had a scan at [facility] 11/26/2021 when he was admitted to their hospital.  I now have access to those films and have reviewed, all stable. Currently managed on Trelegy, uses albuterol about once a week.  Oxygen at He is feeling pretty good, does get tired in the afternoon. He had another fall since I last saw him. His seizure medication has been stopped. He has O2 but does not use it unless he is doing strenuous activity. Occasionally uses his CPAP, not every night  CT chest 05/24/2022 reviewed by me showed that the 3.1 cm right lower lobe mass remained stable, unchanged going back to at least 2014 (possibly 2008).  His left lower lobe and right upper lobe nodules are unchanged.  There is some new subtle tree-in-bud nodularity in the right upper lobe consistent with inflammatory change as well as some new 1 to 2 mm nodules in the left upper lobe again most likely inflammatory.                                                              Review of Systems  Constitutional:  Negative for fever and  unexpected weight change.  HENT:  Negative for congestion, dental problem, ear pain, nosebleeds, postnasal drip, rhinorrhea, sinus pressure, sneezing, sore throat and trouble swallowing.   Eyes:  Negative for redness and itching.  Respiratory:  Positive for shortness of breath. Negative for cough, chest tightness and wheezing.   Cardiovascular:  Negative for palpitations and leg swelling.  Gastrointestinal:  Negative for nausea and vomiting.  Genitourinary:  Negative for dysuria.  Musculoskeletal:  Negative for joint swelling.       Pain and stiffness  Skin:  Negative for rash.  Neurological:  Negative for headaches.  Hematological:  Does not bruise/bleed easily.  Psychiatric/Behavioral:  Negative for dysphoric mood and suicidal ideas. The patient is not nervous/anxious.       Objective:   Physical Exam Vitals:   07/15/22 1056  BP: 124/76  Pulse: 63  Temp: 97.8 F (36.6 C)  TempSrc: Oral  SpO2: 95%  Weight: 192 lb 8 oz (87.3 kg)   Gen: Pleasant, overweight, in no distress,  normal affect  ENT: No lesions,  mouth clear,  oropharynx clear, no postnasal drip  Neck: No JVD, no stridor  Lungs: No use of accessory muscles, distant, no wheezing  Cardiovascular: RRR,  heart sounds normal, no murmur or gallops, no peripheral edema  Musculoskeletal: No deformities, no cyanosis or clubbing  Neuro: alert, non focal  Skin: Warm, no lesions or rashes     Assessment & Plan:  Lung nodule seen on imaging study He has a right lower lobe mass adjacent to the pleura, consistent with a hamartoma or benign tumor.  Has been stable at least going back to 2014, possibly to 2008.  He also has right upper lobe and left upper lobe 4-5 mm nodules that have been stable on serial films.  There is a small amount of right upper lobe tree-in-bud, some 1-2 mm left upper lobe nodules that are new on the most recent scan.  I think we can continue to follow him annually.  Next scan in 05/2023  COPD (chronic  obstructive pulmonary disease) Please continue your Trelegy 1 inhalation once daily.  Rinse and gargle after using. Keep albuterol available to use 2 puffs up to every 4 hours if needed for shortness of breath, chest tightness, wheezing.  Get the flu shot this fall and look for updates regarding any COVID-19 boosters this fall. Follow with Dr Lamonte Sakai in 6 months or sooner if you have any problems  Chronic hypoxemic respiratory failure Keep your oxygen available to use 2 L/min with heavy exertion.  Our goal is to keep your saturations > 90%.  Baltazar Apo, MD, PhD 07/15/2022, 11:19 AM  Pulmonary and Critical Care [phone] or if no answer [phone]

## 2022-07-20 DIAGNOSIS — M5416 Radiculopathy, lumbar region: Secondary | ICD-10-CM | POA: Diagnosis not present

## 2022-07-20 DIAGNOSIS — Z9889 Other specified postprocedural states: Secondary | ICD-10-CM | POA: Diagnosis not present

## 2022-07-20 DIAGNOSIS — M5442 Lumbago with sciatica, left side: Secondary | ICD-10-CM | POA: Diagnosis not present

## 2022-07-20 DIAGNOSIS — G8929 Other chronic pain: Secondary | ICD-10-CM | POA: Diagnosis not present

## 2022-08-01 ENCOUNTER — Other Ambulatory Visit: Payer: Self-pay | Admitting: Family Medicine

## 2022-08-09 ENCOUNTER — Encounter: Payer: Self-pay | Admitting: Family Medicine

## 2022-08-10 NOTE — Telephone Encounter (Signed)
See if he can do the 15th or DOD is ok

## 2022-08-13 ENCOUNTER — Other Ambulatory Visit: Payer: Self-pay | Admitting: Family Medicine

## 2022-08-13 DIAGNOSIS — E1169 Type 2 diabetes mellitus with other specified complication: Secondary | ICD-10-CM

## 2022-08-16 ENCOUNTER — Ambulatory Visit: Payer: Medicare Other | Admitting: Family Medicine

## 2022-08-26 DIAGNOSIS — Z9889 Other specified postprocedural states: Secondary | ICD-10-CM | POA: Diagnosis not present

## 2022-08-26 DIAGNOSIS — E1100 Type 2 diabetes mellitus with hyperosmolarity without nonketotic hyperglycemic-hyperosmolar coma (NKHHC): Secondary | ICD-10-CM | POA: Diagnosis not present

## 2022-08-26 DIAGNOSIS — M5416 Radiculopathy, lumbar region: Secondary | ICD-10-CM | POA: Diagnosis not present

## 2022-09-01 ENCOUNTER — Ambulatory Visit (INDEPENDENT_AMBULATORY_CARE_PROVIDER_SITE_OTHER): Payer: Medicare Other | Admitting: Family Medicine

## 2022-09-01 ENCOUNTER — Encounter: Payer: Self-pay | Admitting: Family Medicine

## 2022-09-01 VITALS — BP 131/75 | HR 78 | Temp 97.9°F | Ht 65.0 in | Wt 193.4 lb

## 2022-09-01 DIAGNOSIS — R296 Repeated falls: Secondary | ICD-10-CM

## 2022-09-01 DIAGNOSIS — E1159 Type 2 diabetes mellitus with other circulatory complications: Secondary | ICD-10-CM | POA: Diagnosis not present

## 2022-09-01 DIAGNOSIS — E785 Hyperlipidemia, unspecified: Secondary | ICD-10-CM | POA: Diagnosis not present

## 2022-09-01 DIAGNOSIS — E1169 Type 2 diabetes mellitus with other specified complication: Secondary | ICD-10-CM

## 2022-09-01 DIAGNOSIS — D692 Other nonthrombocytopenic purpura: Secondary | ICD-10-CM | POA: Diagnosis not present

## 2022-09-01 DIAGNOSIS — Z23 Encounter for immunization: Secondary | ICD-10-CM

## 2022-09-01 DIAGNOSIS — I152 Hypertension secondary to endocrine disorders: Secondary | ICD-10-CM | POA: Diagnosis not present

## 2022-09-01 LAB — BAYER DCA HB A1C WAIVED: HB A1C (BAYER DCA - WAIVED): 6.4 % — ABNORMAL HIGH (ref 4.8–5.6)

## 2022-09-01 MED ORDER — TRULICITY 1.5 MG/0.5ML ~~LOC~~ SOAJ: SUBCUTANEOUS | 3 refills | Status: DC

## 2022-09-01 MED ORDER — LISINOPRIL 10 MG PO TABS: 10.0000 mg | ORAL_TABLET | Freq: Every day | ORAL | 3 refills | Status: DC

## 2022-09-01 MED ORDER — PRAVASTATIN SODIUM 40 MG PO TABS: 40.0000 mg | ORAL_TABLET | Freq: Every day | ORAL | 3 refills | Status: DC

## 2022-09-01 MED ORDER — METFORMIN HCL 500 MG PO TABS: 1000.0000 mg | ORAL_TABLET | Freq: Every day | ORAL | 3 refills | Status: DC

## 2022-09-01 NOTE — Patient Instructions (Signed)

## 2022-09-01 NOTE — Progress Notes (Signed)
Subjective: CC: Chronic follow-up PCP: Janora Norlander, DO Brent Snow is a 81 y.o. male presenting to clinic today for:  1. Type 2 Diabetes with hypertension, hyperlipidemia:  Patient is compliant with Trulicity, metformin 595 mg daily, lisinopril 10 mg (this was a reduction due to hypotension with 20 mg), and Pravachol.  Last eye exam: Up-to-date Last foot exam: needs Last A1c:  Lab Results  Component Value Date   HGBA1C 6.4 (H) 09/01/2022   Nephropathy screen indicated?:  On ACE inhibitor Last flu, zoster and/or pneumovax:  Immunization History  Administered Date(s) Administered   Fluad Quad(high Dose 65+) 10/25/2019, 09/01/2020, 11/04/2021, 09/01/2022   Influenza, High Dose Seasonal PF 10/18/2018   Influenza,inj,quad, With Preservative 10/02/2018   Moderna Sars-Covid-2 Vaccination 12/18/2019   PFIZER(Purple Top)SARS-COV-2 Vaccination 02/15/2020   Pneumococcal Conjugate-13 10/24/2013   Pneumococcal Polysaccharide-23 12/06/2006, 10/11/2012, 11/03/2016   Td 05/17/2006   Zoster Recombinat (Shingrix) 07/06/2021, 01/06/2022    ROS: Denies chest pain, shortness of breath, visual disturbance.  He has sustained a couple of falls and this concerns his wife.    ROS: Per HPI  No Known Allergies Past Medical History:  Diagnosis Date   Arthritis    COPD, severe ([place])    pulmologist-  dr byrum   Dyspnea on exertion    prescribed to use O2 via [place]--- per pt he does not use O2 he just take 1-2 puffs of rescue inhaler   Hiatal hernia    History of acute respiratory failure    02-18-2007  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   Hypertension    Hypoxemic respiratory failure, chronic ([facility])    pulmologist-  dr byrum-- prescribed supplemental O2 @ 2L via     OSA on CPAP    and added O2 @ 2L via  w/ cpap   Phimosis    S/P balloon dilatation of esophageal stricture    multiple    Type 2 diabetes mellitus ([place])    followed by pcp--  per note  uncontrolled w/ last A1c >14 on 12-30-2016   Wears contact lenses     Current Outpatient Medications:    Albuterol Sulfate (PROAIR RESPICLICK) 638 (90 Base) MCG/ACT AEPB, Inhale 2 puffs into the lungs every 6 (six) hours as needed., Disp: 1 each, Rfl: 5   finasteride (PROSCAR) 5 MG tablet, Take 1 tablet (5 mg total) by mouth daily., Disp: 30 tablet, Rfl: 11   Lacosamide 100 MG TABS, Take by mouth., Disp: , Rfl:    lisinopril (ZESTRIL) 20 MG tablet, Take 1 tablet (20 mg total) by mouth daily., Disp: 90 tablet, Rfl: 3   metFORMIN (GLUCOPHAGE) 500 MG tablet, TAKE 2 TABLETS BY MOUTH TWICE DAILY WITH A MEAL, Disp: 360 tablet, Rfl: 0   pravastatin (PRAVACHOL) 40 MG tablet, Take 40 mg by mouth daily., Disp: , Rfl:    tamsulosin (FLOMAX) 0.4 MG CAPS capsule, Take 1 capsule (0.4 mg total) by mouth 2 (two) times daily., Disp: 180 capsule, Rfl: 3   TRELEGY ELLIPTA 100-62.5-25 MCG/ACT AEPB, USE 1 INHALATION ORALLY    DAILY, Disp: 180 each, Rfl: 0   TRULICITY 1.5 VF/6.4PP SOPN, INJECT 0.5ML (=1.5 MG)     SUBCUTANEOUSLY ONCE WEEKLY, Disp: 6 mL, Rfl: 0 Social History   Socioeconomic History   Marital status: Married    Spouse name: Not on file   Number of children: 2   Years of education: 15   Highest education level: Some college, no degree  Occupational  History   Occupation: Retired    Comment: Secondary school teacher  Tobacco Use   Smoking status: Former    Packs/day: 2.00    Years: 40.00    Total pack years: 80.00    Types: Cigarettes    Quit date: 12/06/2006    Years since quitting: 15.7   Smokeless tobacco: Never  Vaping Use   Vaping Use: Never used  Substance and Sexual Activity   Alcohol use: Yes    Alcohol/week: 0.0 standard drinks of alcohol    Comment: VERY LITTLE   Drug use: No   Sexual activity: Not on file  Other Topics Concern   Not on file  Social History Narrative   Not on file   Social Determinants of Health   Financial Resource Strain: Low Risk   (10/26/2021)   Overall Financial Resource Strain (CARDIA)    Difficulty of Paying Living Expenses: Not hard at all  Food Insecurity: No Food Insecurity (10/26/2021)   Hunger Vital Sign    Worried About Running Out of Food in the Last Year: Never true    Ran Out of Food in the Last Year: Never true  Transportation Needs: No Transportation Needs (10/26/2021)   PRAPARE - Hydrologist (Medical): No    Lack of Transportation (Non-Medical): No  Physical Activity: Sufficiently Active (10/26/2021)   Exercise Vital Sign    Days of Exercise per Week: 3 days    Minutes of Exercise per Session: 60 min  Stress: No Stress Concern Present (10/26/2021)   [place]    Feeling of Stress : Not at all  Social Connections: [place] (10/26/2021)   Social Connection and Isolation Panel [NHANES]    Frequency of Communication with Friends and Family: More than three times a week    Frequency of Social Gatherings with Friends and Family: More than three times a week    Attends Religious Services: More than 4 times per year    Active Member of [employer] or Organizations: Yes    Attends Music therapist: More than 4 times per year    Marital Status: Married  Human resources officer Violence: Not At Risk (05/07/2019)   Humiliation, Afraid, Rape, and Kick questionnaire    Fear of Current or Ex-Partner: No    Emotionally Abused: No    Physically Abused: No    Sexually Abused: No   Family History  Problem Relation Age of Onset   Asthma Mother    COPD Mother    Cancer Maternal Grandmother        stomach   Early death Father 77       Tractor Accident    Objective: Office vital signs reviewed. BP 131/75   Pulse 78   Temp 97.9 F (36.6 C)   Ht '5\' 5"'  (1.651 m)   Wt 193 lb 6.4 oz (87.7 kg)   SpO2 92%   BMI 32.18 kg/m   Physical Examination:  General: Awake, alert, obese, No acute  distress HEENT: Normal    Neck: No masses palpated. No lymphadenopathy    Ears: Left Tympanic membranes intact, normal light reflex, no erythema, no bulging.  Right TM obscured by cerumen    Eyes: PERRLA, extraocular membranes intact, sclera white    Nose: nasal turbinates moist, no nasal discharge    Throat: moist mucus membranes, no erythema, no tonsillar exudate.  Airway is patent Cardio: regular rate and rhythm, S1S2 heard,  murmur present Pulm: clear to auscultation bilaterally, no wheezes, rhonchi or rales; normal work of breathing on room air GI: soft, non-tender, non-distended, bowel sounds present x4, no hepatomegaly, no splenomegaly, no masses Extremities: warm, well perfused, No edema, cyanosis or clubbing; +2 pulses bilaterally MSK: Slightly antalgic gait.  Ambulating independently  Assessment/ Plan: 81 y.o. male   Type 2 diabetes mellitus with other specified complication, without long-term current use of insulin ([place]) - Plan: metFORMIN (GLUCOPHAGE) 500 MG tablet, Dulaglutide (TRULICITY) 1.5 ZO/1.0RU SOPN, Bayer DCA Hb A1c Waived  Need for immunization against influenza - Plan: Flu Vaccine QUAD High Dose(Fluad)  Hypertension associated with diabetes ([place]) - Plan: lisinopril (ZESTRIL) 10 MG tablet, CMP14+EGFR  Hyperlipidemia associated with type 2 diabetes mellitus ([place]) - Plan: pravastatin (PRAVACHOL) 40 MG tablet, Lipid Panel, CMP14+EGFR, TSH  Senile purpura ([place]) - Plan: CBC  Frequent falls  Check A1c.  I have adjusted his medications to reflect current use.  Due for diabetic foot exam.  Influenza vaccination administered  Blood pressure is controlled.  No changes.  I have rewritten the prescription to reflect 10 mg/day usage  Pravachol renewed.  Check fasting lipid, CMP and TSH  Check CBC given senile purpura noted on exam  Discussed consideration for referral to physical therapy if he has ongoing recurrent falls.  No orders of the defined types were placed in  this encounter.  No orders of the defined types were placed in this encounter.     Janora Norlander, DO [place] [phone]

## 2022-09-02 LAB — CBC
Hematocrit: 46.9 % (ref 37.5–51.0)
Hemoglobin: 16 g/dL (ref 13.0–17.7)
MCH: 31.7 pg (ref 26.6–33.0)
MCHC: 34.1 g/dL (ref 31.5–35.7)
MCV: 93 fL (ref 79–97)
Platelets: 169 10*3/uL (ref 150–450)
RBC: 5.05 x10E6/uL (ref 4.14–5.80)
RDW: 13.2 % (ref 11.6–15.4)
WBC: 7.5 10*3/uL (ref 3.4–10.8)

## 2022-09-02 LAB — LIPID PANEL
Chol/HDL Ratio: 2.3 ratio (ref 0.0–5.0)
Cholesterol, Total: 114 mg/dL (ref 100–199)
HDL: 49 mg/dL (ref >39)
LDL Chol Calc (NIH): 46 mg/dL (ref 0–99)
Triglycerides: 102 mg/dL (ref 0–149)
VLDL Cholesterol Cal: 19 mg/dL (ref 5–40)

## 2022-09-02 LAB — CMP14+EGFR
ALT: 15 IU/L (ref 0–44)
AST: 14 IU/L (ref 0–40)
Albumin/Globulin Ratio: 1.6 (ref 1.2–2.2)
Albumin: 4.1 g/dL (ref 3.8–4.8)
Alkaline Phosphatase: 83 IU/L (ref 44–121)
BUN/Creatinine Ratio: 26 — ABNORMAL HIGH (ref 10–24)
BUN: 22 mg/dL (ref 8–27)
Bilirubin Total: 0.5 mg/dL (ref 0.0–1.2)
CO2: 27 mmol/L (ref 20–29)
Calcium: 9.3 mg/dL (ref 8.6–10.2)
Chloride: 101 mmol/L (ref 96–106)
Creatinine, Ser: 0.86 mg/dL (ref 0.76–1.27)
Globulin, Total: 2.5 g/dL (ref 1.5–4.5)
Glucose: 128 mg/dL — ABNORMAL HIGH (ref 70–99)
Potassium: 5.1 mmol/L (ref 3.5–5.2)
Sodium: 142 mmol/L (ref 134–144)
Total Protein: 6.6 g/dL (ref 6.0–8.5)
eGFR: 88 mL/min/{1.73_m2} (ref >59)

## 2022-09-02 LAB — TSH: TSH: 1.42 u[IU]/mL (ref 0.450–4.500)

## 2022-09-29 IMAGING — CT CT CHEST W/O CM
2 of 4 series · 15 of 36 positions shown, 18 images · non-contrast
Comparison: 02/12/2013

CLINICAL DATA: Pulmonary nodule.



[Series 2: routine chest without · axial · non-contrast · 0.85mm/px · z∈[-302,-42]mm · 12 of 154 slices shown, 15 images]
[im 12/154  mediastinal]
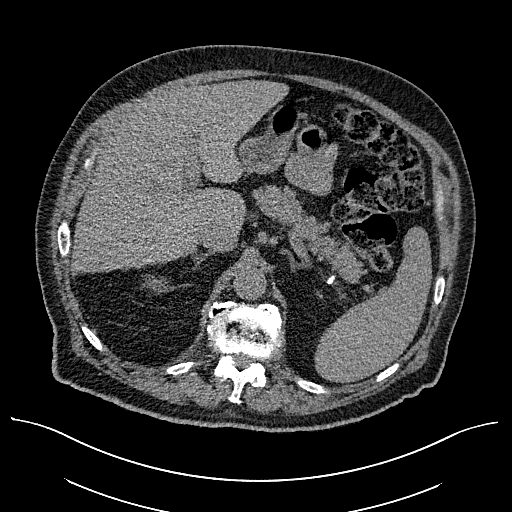
[im 12/154  lung]
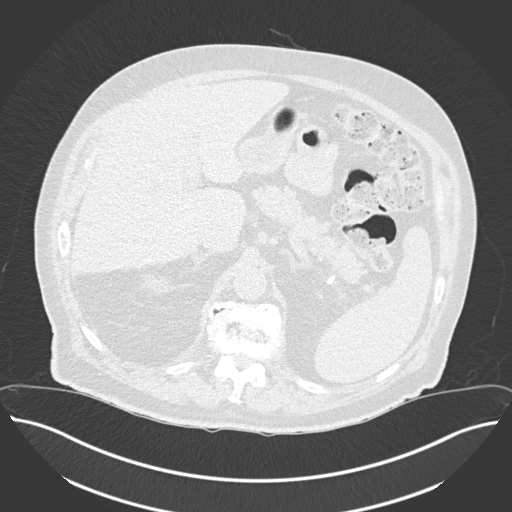
[im 24/154  lung]
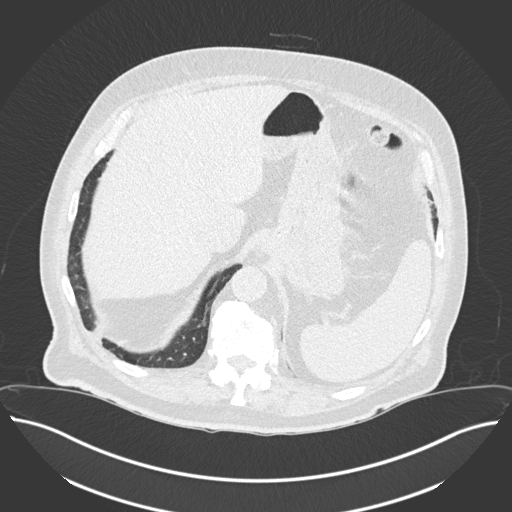
[im 36/154  lung]
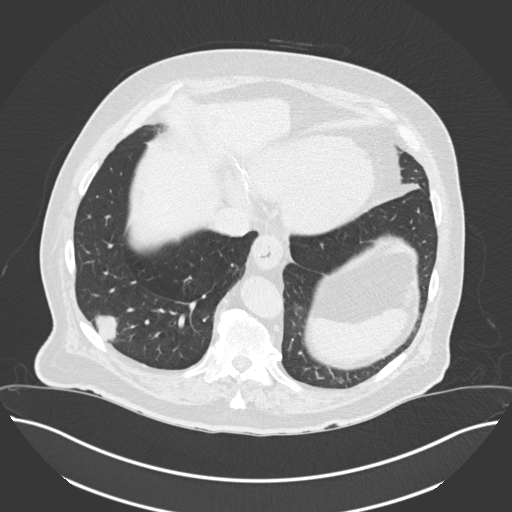
[im 48/154  lung]
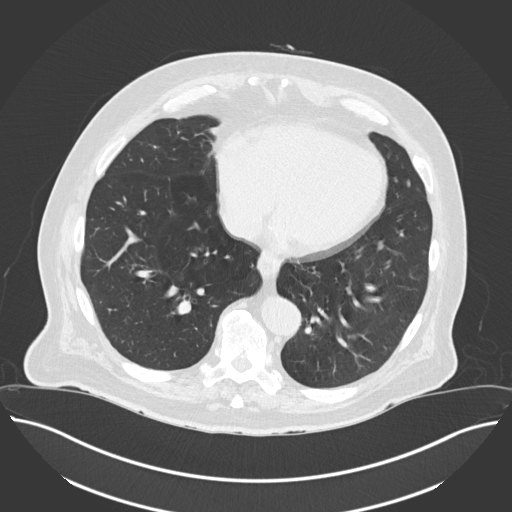
[im 59/154  mediastinal]
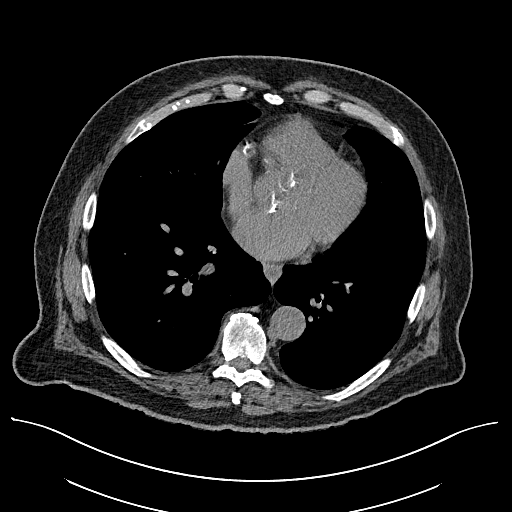
[im 59/154  lung]
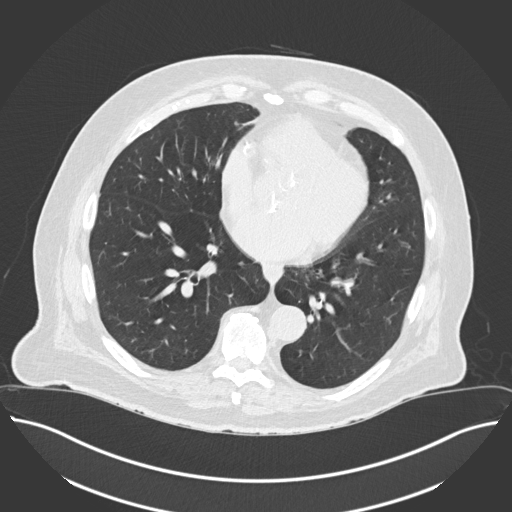
[im 71/154  lung]
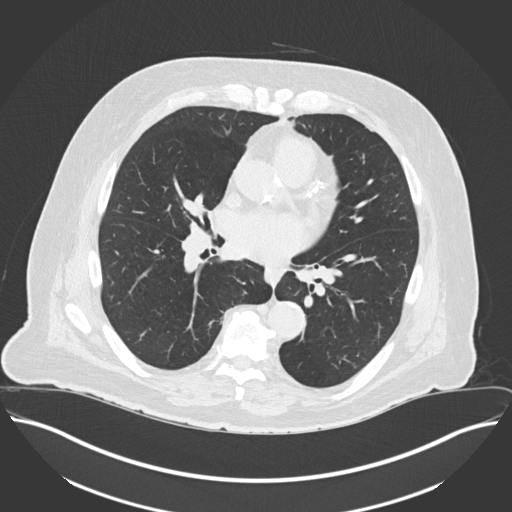
[im 83/154  lung]
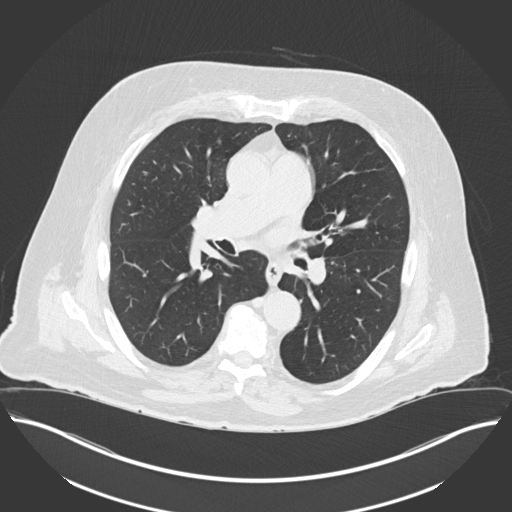
[im 95/154  lung]
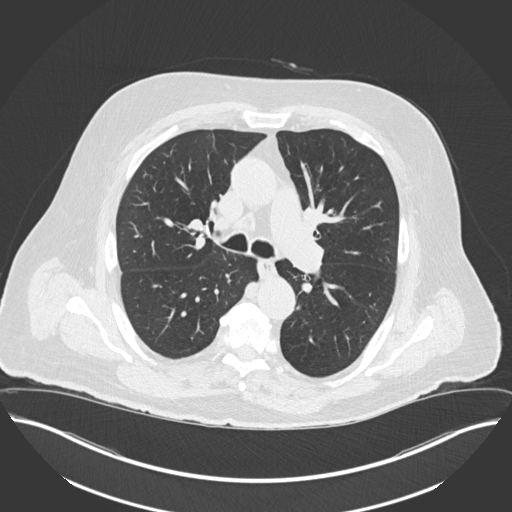
[im 106/154  mediastinal]
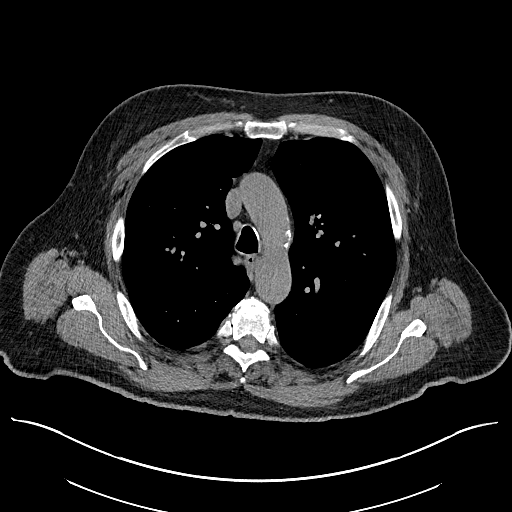
[im 106/154  lung]
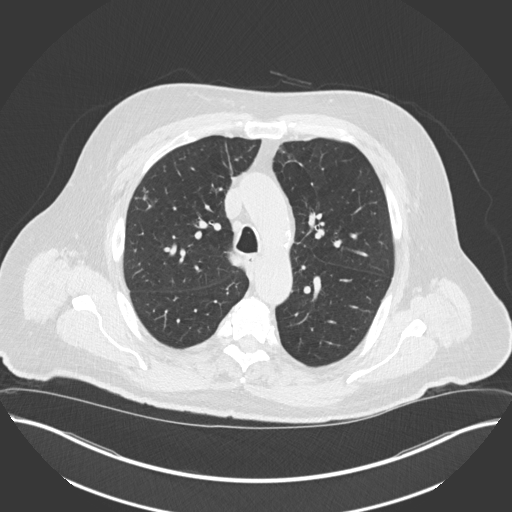
[im 118/154  lung]
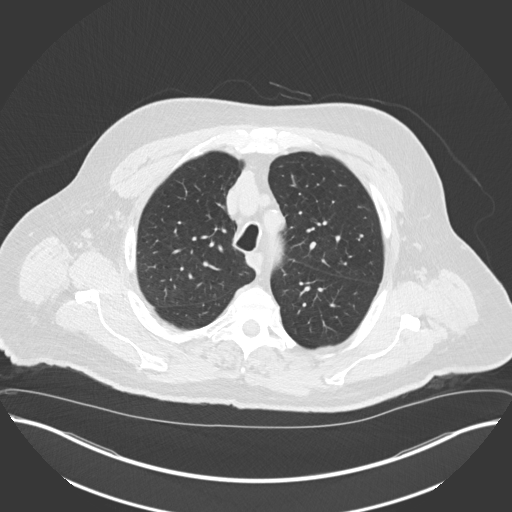
[im 130/154  lung]
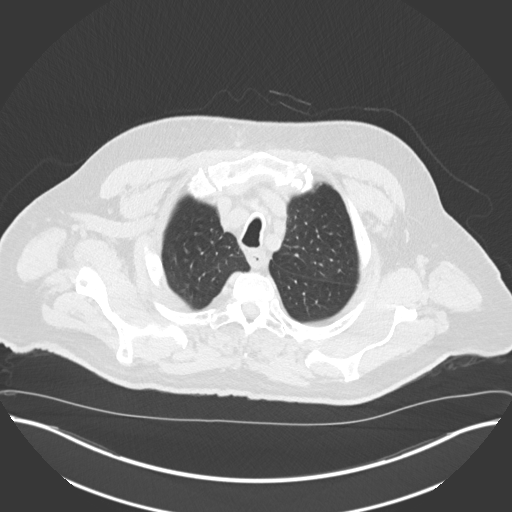
[im 142/154  lung]
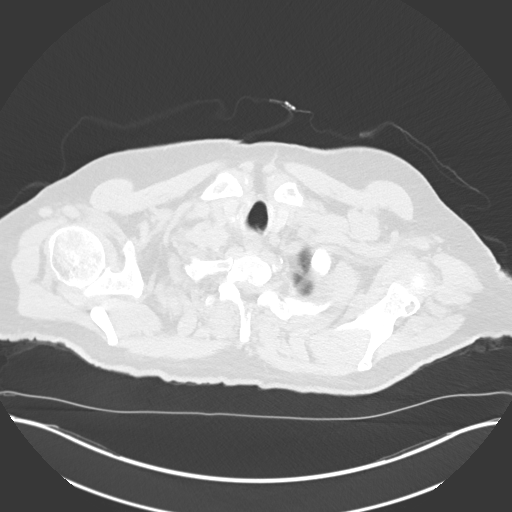

[Series 5: coronal · coronal · 0.62mm/px · 3 of 173 slices shown]
[im 35/173  lung]
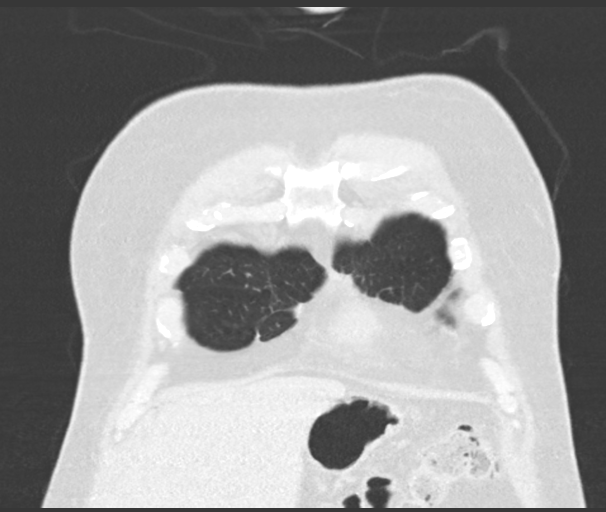
[im 69/173  lung]
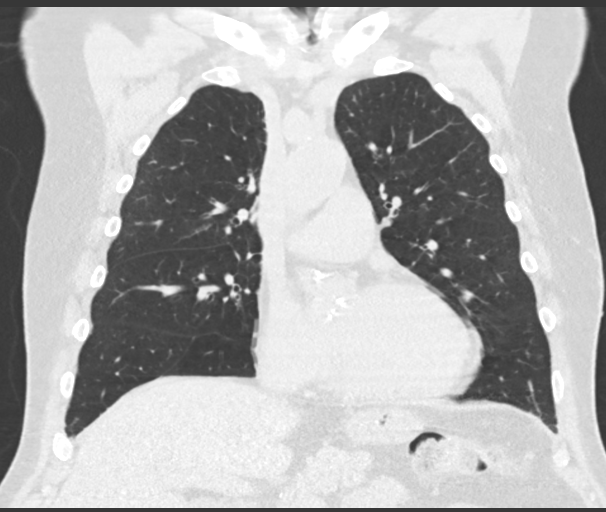
[im 104/173  lung]
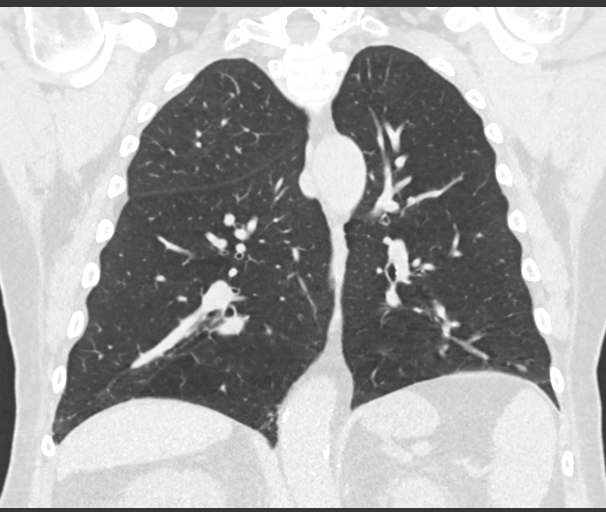

[15 of 36 positions shown; findings below may reference images not displayed]

FINDINGS: Cardiovascular: The heart size is normal. No substantial pericardial
effusion. Coronary artery calcification is evident. Mild
atherosclerotic calcification is noted in the wall of the thoracic
aorta.

Mediastinum/Nodes: No mediastinal lymphadenopathy. No evidence for
gross hilar lymphadenopathy although assessment is limited by the
lack of intravenous contrast on the current study. Mild wall
thickening noted distal esophagus, similar to prior. There is no
axillary lymphadenopathy. Tiny hiatal hernia noted

Lungs/Pleura: Centrilobular emphsyema noted.

4 mm right upper lobe nodule on 32/4 is stable.

New subtle focus of tree-in-bud nodularity identified right upper
lobe on 49/4.

3.1 x 2.3 cm well-defined right lower lobe pulmonary nodule again
identified. As noted previously this is relatively low attenuation
potentially containing fatty components.

5 mm left lower lobe pulmonary nodule on image 58/4 is stable in the
interval.

Tiny 1-2 mm nodules in the left upper lobe on images 33 and 37 of
series 4 new since prior.

Upper Abdomen: Unremarkable.

Musculoskeletal: No worrisome lytic or sclerotic osseous
abnormality.
IMPRESSION: 1. 3.1 x 2.3 cm well-defined right lower lobe pulmonary nodule again
identified. As noted previously, this is relatively low attenuation
potentially containing fatty components and reportedly stable since
02/18/2007. Imaging features are most suggestive of a hamartoma.
2. New subtle focus of tree-in-bud nodularity identified right upper
lobe compatible with atypical infection.
3. New tiny 1-2 mm nodules in the left upper lobe. Likely
infectious/inflammatory. 3 month follow-up chest CT could be used to
ensure stability
4. Tiny hiatal hernia.
5. Aortic Atherosclerosis (ERLC8-E9I.I) and Emphysema (ERLC8-8ZH.H).

## 2022-10-19 DIAGNOSIS — Z9889 Other specified postprocedural states: Secondary | ICD-10-CM | POA: Diagnosis not present

## 2022-10-19 DIAGNOSIS — M5416 Radiculopathy, lumbar region: Secondary | ICD-10-CM | POA: Diagnosis not present

## 2022-10-25 DIAGNOSIS — L814 Other melanin hyperpigmentation: Secondary | ICD-10-CM | POA: Diagnosis not present

## 2022-10-25 DIAGNOSIS — L821 Other seborrheic keratosis: Secondary | ICD-10-CM | POA: Diagnosis not present

## 2022-10-25 DIAGNOSIS — Z85820 Personal history of malignant melanoma of skin: Secondary | ICD-10-CM | POA: Diagnosis not present

## 2022-10-25 DIAGNOSIS — L570 Actinic keratosis: Secondary | ICD-10-CM | POA: Diagnosis not present

## 2022-10-25 DIAGNOSIS — Z1283 Encounter for screening for malignant neoplasm of skin: Secondary | ICD-10-CM | POA: Diagnosis not present

## 2022-10-25 DIAGNOSIS — I781 Nevus, non-neoplastic: Secondary | ICD-10-CM | POA: Diagnosis not present

## 2022-10-25 DIAGNOSIS — Z85828 Personal history of other malignant neoplasm of skin: Secondary | ICD-10-CM | POA: Diagnosis not present

## 2022-11-30 ENCOUNTER — Telehealth: Payer: Self-pay | Admitting: Emergency Medicine

## 2022-11-30 MED ORDER — TRELEGY ELLIPTA 100-62.5-25 MCG/ACT IN AEPB: INHALATION_SPRAY | RESPIRATORY_TRACT | 0 refills | Status: DC

## 2022-11-30 NOTE — Telephone Encounter (Signed)
Pt called in this morning for refill on his Trelegy. Sent refill to [facility]. Nothing further needed.

## 2022-12-07 ENCOUNTER — Other Ambulatory Visit: Payer: Self-pay | Admitting: Urology

## 2022-12-07 DIAGNOSIS — N138 Other obstructive and reflux uropathy: Secondary | ICD-10-CM

## 2022-12-09 ENCOUNTER — Ambulatory Visit (INDEPENDENT_AMBULATORY_CARE_PROVIDER_SITE_OTHER): Payer: Medicare Other

## 2022-12-09 VITALS — Ht 67.0 in | Wt 190.0 lb

## 2022-12-09 DIAGNOSIS — Z0000 Encounter for general adult medical examination without abnormal findings: Secondary | ICD-10-CM | POA: Diagnosis not present

## 2022-12-09 NOTE — Progress Notes (Signed)
Subjective:   Brent Snow is a 82 y.o. male who presents for Medicare Annual/Subsequent preventive examination. I connected with  Alfonzo Beers on 12/09/22 by a audio enabled telemedicine application and verified that I am speaking with the correct person using two identifiers.  Patient Location: Home  Provider Location: Home Office  I discussed the limitations of evaluation and management by telemedicine. The patient expressed understanding and agreed to proceed.  Review of Systems     Cardiac Risk Factors include: advanced age (>18mn, >>49women);diabetes mellitus;hypertension;male gender;dyslipidemia     Objective:    Today's Vitals   12/09/22 1209  Weight: 190 lb (86.2 kg)  Height: _0  (1.702 m)   Body mass index is 29.76 kg/m.     12/09/2022   12:12 PM 10/26/2021    9:02 AM 06/18/2020    8:13 PM 05/21/2020    8:12 PM 05/07/2019   10:27 AM 01/27/2017    8:41 AM 10/20/2015   12:03 PM  Advanced Directives  Does Patient Have a Medical Advance Directive? Yes No Yes Yes No No No  Type of AParamed[place]of [place]ke[place]villeLiving will  [place] will [place] Village[place] will     Does patient want to make changes to medical advance directive?   No - Patient declined No - Patient declined     Copy of HLaddin Chart? No - copy requested  No - copy requested No - copy requested     Would patient like information on creating a medical advance directive?  No - Patient declined   Yes (MAU/Ambulatory/Procedural Areas - Information given) Yes (MAU/Ambulatory/Procedural Areas - Information given) No - patient declined information    Current Medications (verified) Outpatient Encounter Medications as of 12/09/2022  Medication Sig   Albuterol Sulfate (PROAIR RESPICLICK) 1768(90 Base) MCG/ACT AEPB Inhale 2 puffs into the lungs every 6 (six) hours as needed.   Dulaglutide (TRULICITY) 1.5 MTL/5.7WISOPN  INJECT 0.5ML (=1.5 MG)     SUBCUTANEOUSLY ONCE WEEKLY   finasteride (PROSCAR) 5 MG tablet Take 1 tablet by mouth once daily   Fluticasone-Umeclidin-Vilant (TRELEGY ELLIPTA) 100-62.5-25 MCG/ACT AEPB USE 1 INHALATION ORALLY    DAILY   lisinopril (ZESTRIL) 10 MG tablet Take 1 tablet (10 mg total) by mouth daily.   metFORMIN (GLUCOPHAGE) 500 MG tablet Take 2 tablets (1,000 mg total) by mouth daily with breakfast.   pravastatin (PRAVACHOL) 40 MG tablet Take 1 tablet (40 mg total) by mouth daily.   tamsulosin (FLOMAX) 0.4 MG CAPS capsule Take 1 capsule (0.4 mg total) by mouth 2 (two) times daily.   No facility-administered encounter medications on file as of 12/09/2022.    Allergies (verified) Patient has no known allergies.   History: Past Medical History:  Diagnosis Date   Arthritis    COPD, severe ([place]    pulmologist-  dr byrum   Dyspnea on exertion    prescribed to use O2 via [place]--- per pt he does not use O2 he just take 1-2 puffs of rescue inhaler   Hiatal hernia    History of acute respiratory failure    02-18-2007  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   Hypertension    Hypoxemic respiratory failure, chronic (HCC)    pulmologist-  dr byrum-- prescribed supplemental O2 @ 2L via [place]    OSA on CPAP    and added O2 @ 2L via [place] w/ cpap  Phimosis    S/P balloon dilatation of esophageal stricture    multiple    Type 2 diabetes mellitus ([place])    followed by pcp--  per note uncontrolled w/ last A1c >14 on 12-30-2016   Wears contact lenses    Past Surgical History:  Procedure Laterality Date   BACK SURGERY     CIRCUMCISION N/A 01/27/2017   Procedure: CIRCUMCISION ADULT;  Surgeon: Franchot Gallo, MD;  Location: [facility];  Service: Urology;  Laterality: N/A;   KNEE ARTHROSCOPY Right 2008   LUMBAR SPINE SURGERY  1977  approx.   TONSILLECTOMY AND ADENOIDECTOMY  age 59   TRANSTHORACIC ECHOCARDIOGRAM  09/12/2015   mild LVH,  ef 65-70%,  grade 1 diastolic  dysfunction/  mild RAE   Family History  Problem Relation Age of Onset   Asthma Mother    COPD Mother    Cancer Maternal Grandmother        stomach   Early death Father 72       Tractor Accident   Social History   Socioeconomic History   Marital status: Married    Spouse name: Not on file   Number of children: 2   Years of education: 15   Highest education level: Some college, no degree  Occupational History   Occupation: Retired    Comment: Secondary school teacher  Tobacco Use   Smoking status: Former    Packs/day: 2.00    Years: 40.00    Total pack years: 80.00    Types: Cigarettes    Quit date: 12/06/2006    Years since quitting: 16.0   Smokeless tobacco: Never  Vaping Use   Vaping Use: Never used  Substance and Sexual Activity   Alcohol use: Yes    Alcohol/week: 0.0 standard drinks of alcohol    Comment: VERY LITTLE   Drug use: No   Sexual activity: Not on file  Other Topics Concern   Not on file  Social History Narrative   Not on file   Social Determinants of Health   Financial Resource Strain: Low Risk  (12/09/2022)   Overall Financial Resource Strain (CARDIA)    Difficulty of Paying Living Expenses: Not hard at all  Food Insecurity: No Food Insecurity (12/09/2022)   Hunger Vital Sign    Worried About Running Out of Food in the Last Year: Never true    Ran Out of Food in the Last Year: Never true  Transportation Needs: No Transportation Needs (12/09/2022)   PRAPARE - Hydrologist (Medical): No    Lack of Transportation (Non-Medical): No  Physical Activity: Insufficiently Active (12/09/2022)   Exercise Vital Sign    Days of Exercise per Week: 3 days    Minutes of Exercise per Session: 30 min  Stress: No Stress Concern Present (12/09/2022)   [place]    Feeling of Stress : Not at all  Social Connections: [place] (12/09/2022)   Social  Connection and Isolation Panel [NHANES]    Frequency of Communication with Friends and Family: More than three times a week    Frequency of Social Gatherings with Friends and Family: More than three times a week    Attends Religious Services: More than 4 times per year    Active Member of [employer] or Organizations: Yes    Attends Music therapist: More than 4 times per year    Marital Status: Married  Tobacco Counseling Counseling given: Not Answered   Clinical Intake:  Pre-visit preparation completed: Yes  Pain : No/denies pain     Nutritional Risks: None Diabetes: No  How often do you need to have someone help you when you read instructions, pamphlets, or other written materials from your doctor or pharmacy?: 1 - Never  Diabetic?yes  Nutrition Risk Assessment:  Has the patient had any N/V/D within the last 2 months?  No  Does the patient have any non-healing wounds?  No  Has the patient had any unintentional weight loss or weight gain?  No   Diabetes:  Is the patient diabetic?  Yes  If diabetic, was a CBG obtained today?  No  Did the patient bring in their glucometer from home?  No  How often do you monitor your CBG's? Daily .   Financial Strains and Diabetes Management:  Are you having any financial strains with the device, your supplies or your medication? No .  Does the patient want to be seen by Chronic Care Management for management of their diabetes?  No  Would the patient like to be referred to a Nutritionist or for Diabetic Management?  No   Diabetic Exams:  Diabetic Eye Exam: Completed 07/2022 Diabetic Foot Exam: Overdue, Pt has been advised about the importance in completing this exam. Pt is scheduled for diabetic foot exam on next office visit .   Interpreter Needed?: No  Information entered by :: Jadene Pierini, LPN   Activities of Daily Living    12/09/2022   12:12 PM  In your present state of health, do you have any difficulty  performing the following activities:  Hearing? 0  Vision? 0  Difficulty concentrating or making decisions? 0  Walking or climbing stairs? 0  Dressing or bathing? 0  Doing errands, shopping? 0  Preparing Food and eating ? N  Using the Toilet? N  In the past six months, have you accidently leaked urine? N  Do you have problems with loss of bowel control? N  Managing your Medications? N  Managing your Finances? N  Housekeeping or managing your Housekeeping? N    Patient Care Team: Janora Norlander, DO as PCP - General (Family Medicine) Collene Gobble, MD as Consulting Physician (Pulmonary Disease) Suella Broad, MD as Consulting Physician (Physical Medicine and Rehabilitation)  Indicate any recent Medical Services you may have received from other than [facility] providers in the past year (date may be approximate).     Assessment:   This is a routine wellness examination for Abie.  Hearing/Vision screen Vision Screening - Comments:: Wears rx glasses - up to date with routine eye exams with  Dr.Lee  Dietary issues and exercise activities discussed: Current Exercise Habits: Home exercise routine, Type of exercise: walking, Time (Minutes): 30, Frequency (Times/Week): 3, Weekly Exercise (Minutes/Week): 90, Intensity: Mild, Exercise limited by: None identified   Goals Addressed             This Visit's Progress    DIET - INCREASE WATER INTAKE   On track      Depression Screen    12/09/2022   12:11 PM 09/01/2022    1:07 PM 01/06/2022   10:18 AM 12/14/2021    2:46 PM 12/03/2021    8:32 AM 11/04/2021    4:18 PM 10/26/2021    9:03 AM  PHQ 2/9 Scores  PHQ - 2 Score 0 0 0 0 0 0 0  PHQ- 9 Score   _0 0  Fall Risk    12/09/2022   12:10 PM 09/01/2022    1:06 PM 01/06/2022   10:18 AM 12/14/2021    2:46 PM 12/03/2021    8:32 AM  [place] in the past year? 0 0 _0 Number falls in past yr: 0  0 1 1  Injury with Fall? 0  _1 Risk for fall due to : No Fall Risks   History of fall(s) History of fall(s) Impaired balance/gait  Follow up Falls prevention discussed  Education provided Education provided     FALL RISK PREVENTION PERTAINING TO THE HOME:  Any stairs in or around the home? Yes  If so, are there any without handrails? No  Home free of loose throw rugs in walkways, pet beds, electrical cords, etc? Yes  Adequate lighting in your home to reduce risk of falls? Yes   ASSISTIVE DEVICES UTILIZED TO PREVENT FALLS:  Life alert? No  Use of a cane, walker or w/c? No  Grab bars in the bathroom? Yes  Shower chair or bench in shower? Yes  Elevated toilet seat or a handicapped toilet? Yes          12/09/2022   12:13 PM 10/26/2021    9:07 AM 05/07/2019   10:33 AM  6CIT Screen  What Year? 0 points 0 points 0 points  What month? 0 points 0 points 0 points  What time? 0 points 0 points 0 points  Count back from 20 0 points 0 points 0 points  Months in reverse 0 points 0 points 0 points  Repeat phrase 0 points 2 points 2 points  Total Score 0 points 2 points 2 points    Immunizations Immunization History  Administered Date(s) Administered   Fluad Quad(high Dose 65+) 10/25/2019, 09/01/2020, 11/04/2021, 09/01/2022   Influenza, High Dose Seasonal PF 10/18/2018   Influenza,inj,quad, With Preservative 10/02/2018   Moderna Sars-Covid-2 Vaccination 12/18/2019   PFIZER(Purple Top)SARS-COV-2 Vaccination 02/15/2020   Pneumococcal Conjugate-13 10/24/2013   Pneumococcal Polysaccharide-23 12/06/2006, 10/11/2012, 11/03/2016   Td 05/17/2006   Zoster Recombinat (Shingrix) 07/06/2021, 01/06/2022    TDAP status: Due, Education has been provided regarding the importance of this vaccine. Advised may receive this vaccine at local pharmacy or Health Dept. Aware to provide a copy of the vaccination record if obtained from local pharmacy or Health Dept. Verbalized acceptance and understanding.  Flu Vaccine status: Up to date  Pneumococcal vaccine status: Up to  date  Covid-19 vaccine status: Completed vaccines  Qualifies for Shingles Vaccine? Yes   Zostavax completed Yes   Shingrix Completed?: Yes  Screening Tests Health Maintenance  Topic Date Due   DTaP/Tdap/Td (2 - Tdap) 05/17/2016   Diabetic kidney evaluation - Urine ACR  04/28/2018   COVID-19 Vaccine (3 - Mixed Product risk series) 03/14/2020   FOOT EXAM  06/30/2021   OPHTHALMOLOGY EXAM  11/06/2022   HEMOGLOBIN A1C  03/02/2023   Diabetic kidney evaluation - eGFR measurement  09/02/2023   Medicare Annual Wellness (AWV)  12/10/2023   Pneumonia Vaccine 21+ Years old  Completed   INFLUENZA VACCINE  Completed   Hepatitis C Screening  Completed   Zoster Vaccines- Shingrix  Completed   HPV VACCINES  Aged Out    Health Maintenance  Health Maintenance Due  Topic Date Due   DTaP/Tdap/Td (2 - Tdap) 05/17/2016   Diabetic kidney evaluation - Urine ACR  04/28/2018   COVID-19 Vaccine (3 - Mixed Product risk series) 03/14/2020  FOOT EXAM  06/30/2021   OPHTHALMOLOGY EXAM  11/06/2022    Colorectal cancer screening: No longer required.   Lung Cancer Screening: (Low Dose CT Chest recommended if Age 93-80 years, 30 pack-year currently smoking OR have quit w/in 15years.) does not qualify.   Lung Cancer Screening Referral: n/a  Additional Screening:  Hepatitis C Screening: does not qualify;   Vision Screening: Recommended annual ophthalmology exams for early detection of glaucoma and other disorders of the eye. Is the patient up to date with their annual eye exam?  Yes  Who is the provider or what is the name of the office in which the patient attends annual eye exams? Dr.Lee  If pt is not established with a provider, would they like to be referred to a provider to establish care? No .   Dental Screening: Recommended annual dental exams for proper oral hygiene  Community Resource Referral / Chronic Care Management: CRR required this visit?  No   CCM required this visit?  No       Plan:     I have personally reviewed and noted the following in the patient's chart:   Medical and social history Use of alcohol, tobacco or illicit drugs  Current medications and supplements including opioid prescriptions. Patient is not currently taking opioid prescriptions. Functional ability and status Nutritional status Physical activity Advanced directives List of other physicians Hospitalizations, surgeries, and ER visits in previous 12 months Vitals Screenings to include cognitive, depression, and falls Referrals and appointments  In addition, I have reviewed and discussed with patient certain preventive protocols, quality metrics, and best practice recommendations. A written personalized care plan for preventive services as well as general preventive health recommendations were provided to patient.     Daphane Shepherd, LPN   01/14/6380   Nurse Notes: Due TDAP vaccine

## 2022-12-09 NOTE — Patient Instructions (Signed)
Brent Snow , Thank you for taking time to come for your Medicare Wellness Visit. I appreciate your ongoing commitment to your health goals. Please review the following plan we discussed and let me know if I can assist you in the future.   These are the goals we discussed:  Goals       DIET - INCREASE WATER INTAKE      Patient Stated (pt-stated)      Maintain healthy diet and exercise regimen.        This is a list of the screening recommended for you and due dates:  Health Maintenance  Topic Date Due   DTaP/Tdap/Td vaccine (2 - Tdap) 05/17/2016   Yearly kidney health urinalysis for diabetes  04/28/2018   COVID-19 Vaccine (3 - Mixed Product risk series) 03/14/2020   Complete foot exam   06/30/2021   Eye exam for diabetics  11/06/2022   Hemoglobin A1C  03/02/2023   Yearly kidney function blood test for diabetes  09/02/2023   Medicare Annual Wellness Visit  12/10/2023   Pneumonia Vaccine  Completed   Flu Shot  Completed   Hepatitis C Screening: USPSTF Recommendation to screen - Ages 18-79 yo.  Completed   Zoster (Shingles) Vaccine  Completed   HPV Vaccine  Aged Out    Advanced directives: Please bring a copy of your health care power of attorney and living will to the office to be added to your chart at your convenience.   Conditions/risks identified: Aim for 30 minutes of exercise or brisk walking, 6-8 glasses of water, and 5 servings of fruits and vegetables each day.   Next appointment: Follow up in one year for your annual wellness visit.   Preventive Care 80 Years and Older, Male  Preventive care refers to lifestyle choices and visits with your health care provider that can promote health and wellness. What does preventive care include? A yearly physical exam. This is also called an annual well check. Dental exams once or twice a year. Routine eye exams. Ask your health care provider how often you should have your eyes checked. Personal lifestyle choices,  including: Daily care of your teeth and gums. Regular physical activity. Eating a healthy diet. Avoiding tobacco and drug use. Limiting alcohol use. Practicing safe sex. Taking low doses of aspirin every day. Taking vitamin and mineral supplements as recommended by your health care provider. What happens during an annual well check? The services and screenings done by your health care provider during your annual well check will depend on your age, overall health, lifestyle risk factors, and family history of disease. Counseling  Your health care provider may ask you questions about your: Alcohol use. Tobacco use. Drug use. Emotional well-being. Home and relationship well-being. Sexual activity. Eating habits. History of falls. Memory and ability to understand (cognition). Work and work Statistician. Screening  You may have the following tests or measurements: Height, weight, and BMI. Blood pressure. Lipid and cholesterol levels. These may be checked every 5 years, or more frequently if you are over 80 years old. Skin check. Lung cancer screening. You may have this screening every year starting at age 39 if you have a 30-pack-year history of smoking and currently smoke or have quit within the past 15 years. Fecal occult blood test (FOBT) of the stool. You may have this test every year starting at age 80. Flexible sigmoidoscopy or colonoscopy. You may have a sigmoidoscopy every 5 years or a colonoscopy every 10 years starting at age  80. Prostate cancer screening. Recommendations will vary depending on your family history and other risks. Hepatitis C blood test. Hepatitis B blood test. Sexually transmitted disease (STD) testing. Diabetes screening. This is done by checking your blood sugar (glucose) after you have not eaten for a while (fasting). You may have this done every 1-3 years. Abdominal aortic aneurysm (AAA) screening. You may need this if you are a current or former  smoker. Osteoporosis. You may be screened starting at age 80 if you are at high risk. Talk with your health care provider about your test results, treatment options, and if necessary, the need for more tests. Vaccines  Your health care provider may recommend certain vaccines, such as: Influenza vaccine. This is recommended every year. Tetanus, diphtheria, and acellular pertussis (Tdap, Td) vaccine. You may need a Td booster every 10 years. Zoster vaccine. You may need this after age 22. Pneumococcal 13-valent conjugate (PCV13) vaccine. One dose is recommended after age 43. Pneumococcal polysaccharide (PPSV23) vaccine. One dose is recommended after age 29. Talk to your health care provider about which screenings and vaccines you need and how often you need them. This information is not intended to replace advice given to you by your health care provider. Make sure you discuss any questions you have with your health care provider. Document Released: 12/19/2015 Document Revised: 08/11/2016 Document Reviewed: 09/23/2015 Elsevier Interactive Patient Education  2017 [place] Prevention in the Home Falls can cause injuries. They can happen to people of all ages. There are many things you can do to make your home safe and to help prevent falls. What can I do on the outside of my home? Regularly fix the edges of walkways and driveways and fix any cracks. Remove anything that might make you trip as you walk through a door, such as a raised step or threshold. Trim any bushes or trees on the path to your home. Use bright outdoor lighting. Clear any walking paths of anything that might make someone trip, such as rocks or tools. Regularly check to see if handrails are loose or broken. Make sure that both sides of any steps have handrails. Any raised decks and porches should have guardrails on the edges. Have any leaves, snow, or ice cleared regularly. Use sand or salt on walking paths during  winter. Clean up any spills in your garage right away. This includes oil or grease spills. What can I do in the bathroom? Use night lights. Install grab bars by the toilet and in the tub and shower. Do not use towel bars as grab bars. Use non-skid mats or decals in the tub or shower. If you need to sit down in the shower, use a plastic, non-slip stool. Keep the floor dry. Clean up any water that spills on the floor as soon as it happens. Remove soap buildup in the tub or shower regularly. Attach bath mats securely with double-sided non-slip rug tape. Do not have throw rugs and other things on the floor that can make you trip. What can I do in the bedroom? Use night lights. Make sure that you have a light by your bed that is easy to reach. Do not use any sheets or blankets that are too big for your bed. They should not hang down onto the floor. Have a firm chair that has side arms. You can use this for support while you get dressed. Do not have throw rugs and other things on the floor that can make you  trip. What can I do in the kitchen? Clean up any spills right away. Avoid walking on wet floors. Keep items that you use a lot in easy-to-reach places. If you need to reach something above you, use a strong step stool that has a grab bar. Keep electrical cords out of the way. Do not use floor polish or wax that makes floors slippery. If you must use wax, use non-skid floor wax. Do not have throw rugs and other things on the floor that can make you trip. What can I do with my stairs? Do not leave any items on the stairs. Make sure that there are handrails on both sides of the stairs and use them. Fix handrails that are broken or loose. Make sure that handrails are as long as the stairways. Check any carpeting to make sure that it is firmly attached to the stairs. Fix any carpet that is loose or worn. Avoid having throw rugs at the top or bottom of the stairs. If you do have throw rugs,  attach them to the floor with carpet tape. Make sure that you have a light switch at the top of the stairs and the bottom of the stairs. If you do not have them, ask someone to add them for you. What else can I do to help prevent falls? Wear shoes that: Do not have high heels. Have rubber bottoms. Are comfortable and fit you well. Are closed at the toe. Do not wear sandals. If you use a stepladder: Make sure that it is fully opened. Do not climb a closed stepladder. Make sure that both sides of the stepladder are locked into place. Ask someone to hold it for you, if possible. Clearly mark and make sure that you can see: Any grab bars or handrails. First and last steps. Where the edge of each step is. Use tools that help you move around (mobility aids) if they are needed. These include: Canes. Walkers. Scooters. Crutches. Turn on the lights when you go into a dark area. Replace any light bulbs as soon as they burn out. Set up your furniture so you have a clear path. Avoid moving your furniture around. If any of your floors are uneven, fix them. If there are any pets around you, be aware of where they are. Review your medicines with your doctor. Some medicines can make you feel dizzy. This can increase your chance of falling. Ask your doctor what other things that you can do to help prevent falls. This information is not intended to replace advice given to you by your health care provider. Make sure you discuss any questions you have with your health care provider. Document Released: 09/18/2009 Document Revised: 04/29/2016 Document Reviewed: 12/27/2014 Elsevier Interactive Patient Education  2017 [employer].

## 2022-12-15 DIAGNOSIS — M5416 Radiculopathy, lumbar region: Secondary | ICD-10-CM | POA: Diagnosis not present

## 2022-12-15 DIAGNOSIS — Z9889 Other specified postprocedural states: Secondary | ICD-10-CM | POA: Diagnosis not present

## 2023-01-17 DIAGNOSIS — Z9889 Other specified postprocedural states: Secondary | ICD-10-CM | POA: Diagnosis not present

## 2023-01-17 DIAGNOSIS — M5416 Radiculopathy, lumbar region: Secondary | ICD-10-CM | POA: Diagnosis not present

## 2023-02-03 ENCOUNTER — Encounter: Payer: Self-pay | Admitting: Radiology

## 2023-03-07 ENCOUNTER — Other Ambulatory Visit: Payer: Self-pay | Admitting: Urology

## 2023-03-07 DIAGNOSIS — N401 Enlarged prostate with lower urinary tract symptoms: Secondary | ICD-10-CM

## 2023-03-08 ENCOUNTER — Other Ambulatory Visit: Payer: Self-pay | Admitting: *Deleted

## 2023-03-08 MED ORDER — TRELEGY ELLIPTA 100-62.5-25 MCG/ACT IN AEPB: INHALATION_SPRAY | RESPIRATORY_TRACT | 3 refills | Status: DC

## 2023-03-09 ENCOUNTER — Telehealth: Payer: Self-pay | Admitting: *Deleted

## 2023-03-09 DIAGNOSIS — E1169 Type 2 diabetes mellitus with other specified complication: Secondary | ICD-10-CM

## 2023-03-09 NOTE — Telephone Encounter (Signed)
Fax from [facility]: Trulicity [id number] and Trulicity [id number] Both on manufacturer backorder Please prescribe an alternative

## 2023-03-09 NOTE — Telephone Encounter (Signed)
I can switch to Ozempic if he wants to change.

## 2023-03-10 NOTE — Telephone Encounter (Signed)
Patient states he is okay with change

## 2023-03-14 MED ORDER — OZEMPIC (0.25 OR 0.5 MG/DOSE) 2 MG/3ML ~~LOC~~ SOPN: PEN_INJECTOR | SUBCUTANEOUS | 3 refills | Status: DC

## 2023-03-14 NOTE — Addendum Note (Signed)
Addended by: Raliegh Ip on: 03/14/2023 12:26 PM   Modules accepted: Orders

## 2023-03-24 DIAGNOSIS — M5416 Radiculopathy, lumbar region: Secondary | ICD-10-CM | POA: Diagnosis not present

## 2023-03-24 DIAGNOSIS — Z9889 Other specified postprocedural states: Secondary | ICD-10-CM | POA: Diagnosis not present

## 2023-03-28 NOTE — Progress Notes (Signed)
History of Present Illness: Brent Snow is here for followup of BPH w/ a  h/o retention.  Past Medical History:  Diagnosis Date   Arthritis    COPD, severe (HCC)    pulmologist-  dr byrum   Dyspnea on exertion    prescribed to use O2 via [place]--- per pt he does not use O2 he just take 1-2 puffs of rescue inhaler   Hiatal hernia    History of acute respiratory failure    02-18-2007  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   Hypertension    Hypoxemic respiratory failure, chronic (HCC)    pulmologist-  dr byrum-- prescribed supplemental O2 @ 2L via [place]    OSA on CPAP    and added O2 @ 2L via [place] w/ cpap   Phimosis    S/P balloon dilatation of esophageal stricture    multiple    Type 2 diabetes mellitus (HCC)    followed by pcp--  per note uncontrolled w/ last A1c >14 on 12-30-2016   Wears contact lenses     Past Surgical History:  Procedure Laterality Date   BACK SURGERY     CIRCUMCISION N/A 01/27/2017   Procedure: CIRCUMCISION ADULT;  Surgeon: Marcine Matar, MD;  Location: [facility];  Service: Urology;  Laterality: N/A;   KNEE ARTHROSCOPY Right 2008   LUMBAR SPINE SURGERY  1977  approx.   TONSILLECTOMY AND ADENOIDECTOMY  age 17   TRANSTHORACIC ECHOCARDIOGRAM  09/12/2015   mild LVH,  ef 65-70%,  grade 1 diastolic dysfunction/  mild RAE    Home Medications:  Allergies as of 03/29/2023   No Known Allergies      Medication List        Accurate as of March 28, 2023 10:23 AM. If you have any questions, ask your nurse or doctor.          Albuterol Sulfate 108 (90 Base) MCG/ACT Aepb Commonly known as: PROAIR RESPICLICK Inhale 2 puffs into the lungs every 6 (six) hours as needed.   finasteride 5 MG tablet Commonly known as: PROSCAR Take 1 tablet by mouth once daily   lisinopril 10 MG tablet Commonly known as: ZESTRIL Take 1 tablet (10 mg total) by mouth daily.   metFORMIN 500 MG tablet Commonly known as: GLUCOPHAGE Take 2 tablets (1,000 mg total) by  mouth daily with breakfast.   Ozempic (0.25 or 0.5 MG/DOSE) 2 MG/3ML Sopn Generic drug: Semaglutide(0.25 or 0.5MG /DOS) Inject 0.25 mg into the skin every 7 (seven) days for 28 days, THEN 0.5 mg every 7 (seven) days. Start taking on: March 14, 2023   pravastatin 40 MG tablet Commonly known as: PRAVACHOL Take 1 tablet (40 mg total) by mouth daily.   tamsulosin 0.4 MG Caps capsule Commonly known as: FLOMAX Take 1 capsule by mouth twice daily   Trelegy Ellipta 100-62.5-25 MCG/ACT Aepb Generic drug: Fluticasone-Umeclidin-Vilant USE 1 INHALATION ORALLY    DAILY        Allergies: No Known Allergies  Family History  Problem Relation Age of Onset   Asthma Mother    COPD Mother    Cancer Maternal Grandmother        stomach   Early death Father 46       Tractor Accident    Social History:  reports that he quit smoking about 16 years ago. His smoking use included cigarettes. He has a 80.00 pack-year smoking history. He has never used smokeless tobacco. He reports current alcohol use. He  reports that he does not use drugs.  ROS: A complete review of systems was performed.  All systems are negative except for pertinent findings as noted.  Physical Exam:  Vital signs in last 24 hours: There were no vitals taken for this visit. Constitutional:  Alert and oriented, No acute distress Cardiovascular: Regular rate  Respiratory: Normal respiratory effort GI: Abdomen is soft, nontender, nondistended, no abdominal masses. No CVAT.  Genitourinary: Normal male phallus, testes are descended bilaterally and non-tender and without masses, scrotum is normal in appearance without lesions or masses, perineum is normal on inspection. Lymphatic: No lymphadenopathy Neurologic: Grossly intact, no focal deficits Psychiatric: Normal mood and affect  I have reviewed prior pt notes  I have reviewed urinalysis results  I have independently reviewed prior imaging  I have reviewed prior PSA  results    Impression/Assessment:  ***  Plan:  ***

## 2023-03-29 ENCOUNTER — Ambulatory Visit (INDEPENDENT_AMBULATORY_CARE_PROVIDER_SITE_OTHER): Payer: Medicare Other | Admitting: Urology

## 2023-03-29 ENCOUNTER — Encounter: Payer: Self-pay | Admitting: Urology

## 2023-03-29 VITALS — BP 168/85 | HR 75

## 2023-03-29 DIAGNOSIS — N401 Enlarged prostate with lower urinary tract symptoms: Secondary | ICD-10-CM | POA: Diagnosis not present

## 2023-03-29 DIAGNOSIS — R339 Retention of urine, unspecified: Secondary | ICD-10-CM

## 2023-03-29 DIAGNOSIS — N138 Other obstructive and reflux uropathy: Secondary | ICD-10-CM

## 2023-03-29 LAB — URINALYSIS, ROUTINE W REFLEX MICROSCOPIC
Bilirubin, UA: NEGATIVE
Glucose, UA: NEGATIVE
Ketones, UA: NEGATIVE
Nitrite, UA: NEGATIVE
RBC, UA: NEGATIVE
Specific Gravity, UA: 1.015 (ref 1.005–1.030)
Urobilinogen, Ur: 0.2 mg/dL (ref 0.2–1.0)
pH, UA: 6 (ref 5.0–7.5)

## 2023-03-29 LAB — MICROSCOPIC EXAMINATION: Bacteria, UA: NONE SEEN

## 2023-05-17 ENCOUNTER — Ambulatory Visit (INDEPENDENT_AMBULATORY_CARE_PROVIDER_SITE_OTHER): Payer: Medicare Other | Admitting: Family Medicine

## 2023-05-17 ENCOUNTER — Encounter: Payer: Self-pay | Admitting: Family Medicine

## 2023-05-17 VITALS — BP 125/71 | HR 100 | Temp 98.7°F | Ht 67.0 in | Wt 193.0 lb

## 2023-05-17 DIAGNOSIS — Z7985 Long-term (current) use of injectable non-insulin antidiabetic drugs: Secondary | ICD-10-CM

## 2023-05-17 DIAGNOSIS — L821 Other seborrheic keratosis: Secondary | ICD-10-CM | POA: Diagnosis not present

## 2023-05-17 DIAGNOSIS — E1169 Type 2 diabetes mellitus with other specified complication: Secondary | ICD-10-CM | POA: Diagnosis not present

## 2023-05-17 DIAGNOSIS — I781 Nevus, non-neoplastic: Secondary | ICD-10-CM | POA: Diagnosis not present

## 2023-05-17 DIAGNOSIS — E1159 Type 2 diabetes mellitus with other circulatory complications: Secondary | ICD-10-CM

## 2023-05-17 DIAGNOSIS — S81811A Laceration without foreign body, right lower leg, initial encounter: Secondary | ICD-10-CM | POA: Diagnosis not present

## 2023-05-17 DIAGNOSIS — Z7984 Long term (current) use of oral hypoglycemic drugs: Secondary | ICD-10-CM | POA: Diagnosis not present

## 2023-05-17 DIAGNOSIS — L814 Other melanin hyperpigmentation: Secondary | ICD-10-CM | POA: Diagnosis not present

## 2023-05-17 DIAGNOSIS — I152 Hypertension secondary to endocrine disorders: Secondary | ICD-10-CM | POA: Diagnosis not present

## 2023-05-17 DIAGNOSIS — L570 Actinic keratosis: Secondary | ICD-10-CM | POA: Diagnosis not present

## 2023-05-17 DIAGNOSIS — Z85828 Personal history of other malignant neoplasm of skin: Secondary | ICD-10-CM | POA: Diagnosis not present

## 2023-05-17 DIAGNOSIS — N401 Enlarged prostate with lower urinary tract symptoms: Secondary | ICD-10-CM | POA: Diagnosis not present

## 2023-05-17 DIAGNOSIS — N138 Other obstructive and reflux uropathy: Secondary | ICD-10-CM

## 2023-05-17 DIAGNOSIS — Z85820 Personal history of malignant melanoma of skin: Secondary | ICD-10-CM | POA: Diagnosis not present

## 2023-05-17 DIAGNOSIS — J449 Chronic obstructive pulmonary disease, unspecified: Secondary | ICD-10-CM

## 2023-05-17 DIAGNOSIS — E785 Hyperlipidemia, unspecified: Secondary | ICD-10-CM

## 2023-05-17 DIAGNOSIS — L0889 Other specified local infections of the skin and subcutaneous tissue: Secondary | ICD-10-CM | POA: Diagnosis not present

## 2023-05-17 LAB — BAYER DCA HB A1C WAIVED: HB A1C (BAYER DCA - WAIVED): 6.2 % — ABNORMAL HIGH (ref 4.8–5.6)

## 2023-05-17 MED ORDER — PRAVASTATIN SODIUM 40 MG PO TABS: 40.0000 mg | ORAL_TABLET | Freq: Every day | ORAL | 3 refills | Status: DC

## 2023-05-17 MED ORDER — LISINOPRIL 10 MG PO TABS: 10.0000 mg | ORAL_TABLET | Freq: Every day | ORAL | 3 refills | Status: DC

## 2023-05-17 MED ORDER — TRELEGY ELLIPTA 100-62.5-25 MCG/ACT IN AEPB: INHALATION_SPRAY | RESPIRATORY_TRACT | 3 refills | Status: DC

## 2023-05-17 MED ORDER — METFORMIN HCL 500 MG PO TABS: 1000.0000 mg | ORAL_TABLET | Freq: Every day | ORAL | 3 refills | Status: DC

## 2023-05-17 MED ORDER — FINASTERIDE 5 MG PO TABS: 5.0000 mg | ORAL_TABLET | Freq: Every day | ORAL | 3 refills | Status: DC

## 2023-05-17 MED ORDER — TAMSULOSIN HCL 0.4 MG PO CAPS: 0.4000 mg | ORAL_CAPSULE | Freq: Two times a day (BID) | ORAL | 3 refills | Status: DC

## 2023-05-17 MED ORDER — ALBUTEROL SULFATE 108 (90 BASE) MCG/ACT IN AEPB: 2.0000 | INHALATION_SPRAY | Freq: Four times a day (QID) | RESPIRATORY_TRACT | 5 refills | Status: DC | PRN

## 2023-05-17 MED ORDER — OZEMPIC (0.25 OR 0.5 MG/DOSE) 2 MG/3ML ~~LOC~~ SOPN: 0.5000 mg | PEN_INJECTOR | SUBCUTANEOUS | 3 refills | Status: DC

## 2023-05-17 NOTE — Progress Notes (Signed)
Subjective: CC:DM. PCP: Raliegh Ip, DO ZOX:WRUEAV M Hoefer is a 82 y.o. male presenting to clinic today for:  1. Type 2 Diabetes with hypertension, hyperlipidemia:  Trulicity switched to Ozempic in April due to manufacturer backorder.  He reports that he used the Ozempic for 2 weeks to trial it out he did fine with it but still actually had some Trulicity pens left over so he continues on this regimen.  Ultimately he will need to switch over to the Ozempic and wants to make sure that he can get all these medications sent over to the local [facility] because his mail order has been having backorder issues with many of his meds  Last eye exam: needs Last foot exam: needs Last A1c:  Lab Results  Component Value Date   HGBA1C 6.4 (H) 09/01/2022   Nephropathy screen indicated?: needs Last flu, zoster and/or pneumovax:  Immunization History  Administered Date(s) Administered   Fluad Quad(high Dose 65+) 10/25/2019, 09/01/2020, 11/04/2021, 09/01/2022   Influenza, High Dose Seasonal PF 10/18/2018   Influenza,inj,quad, With Preservative 10/02/2018   Moderna Sars-Covid-2 Vaccination 12/18/2019   PFIZER(Purple Top)SARS-COV-2 Vaccination 02/15/2020   Pneumococcal Conjugate-13 10/24/2013   Pneumococcal Polysaccharide-23 12/06/2006, 10/11/2012, 11/03/2016   Td 05/17/2006   Zoster Recombinat (Shingrix) 07/06/2021, 01/06/2022    ROS: denies dizziness, LOC, polyuria, polydipsia, unintended weight loss/gain, foot ulcerations, numbness or tingling in extremities, shortness of breath or chest pain.    ROS: Per HPI  No Known Allergies Past Medical History:  Diagnosis Date   Arthritis    COPD, severe (HCC)    pulmologist-  dr byrum   Dyspnea on exertion    prescribed to use O2 via [place]--- per pt he does not use O2 he just take 1-2 puffs of rescue inhaler   Hiatal hernia    History of acute respiratory failure    02-18-2007  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    Hypertension    Hypoxemic respiratory failure, chronic (HCC)    pulmologist-  dr byrum-- prescribed supplemental O2 @ 2L via [place]    OSA on CPAP    and added O2 @ 2L via [place] w/ cpap   Phimosis    S/P balloon dilatation of esophageal stricture    multiple    Type 2 diabetes mellitus (HCC)    followed by pcp--  per note uncontrolled w/ last A1c >14 on 12-30-2016   Wears contact lenses     Current Outpatient Medications:    Albuterol Sulfate (PROAIR RESPICLICK) 108 (90 Base) MCG/ACT AEPB, Inhale 2 puffs into the lungs every 6 (six) hours as needed., Disp: 1 each, Rfl: 5   finasteride (PROSCAR) 5 MG tablet, Take 1 tablet by mouth once daily, Disp: 90 tablet, Rfl: 3   Fluticasone-Umeclidin-Vilant (TRELEGY ELLIPTA) 100-62.5-25 MCG/ACT AEPB, USE 1 INHALATION ORALLY    DAILY, Disp: 180 each, Rfl: 3   lisinopril (ZESTRIL) 10 MG tablet, Take 1 tablet (10 mg total) by mouth daily., Disp: 90 tablet, Rfl: 3   metFORMIN (GLUCOPHAGE) 500 MG tablet, Take 2 tablets (1,000 mg total) by mouth daily with breakfast., Disp: 180 tablet, Rfl: 3   pravastatin (PRAVACHOL) 40 MG tablet, Take 1 tablet (40 mg total) by mouth daily., Disp: 90 tablet, Rfl: 3   Semaglutide,0.25 or 0.5MG /DOS, (OZEMPIC, 0.25 OR 0.5 MG/DOSE,) 2 MG/3ML SOPN, Inject 0.25 mg into the skin every 7 (seven) days for 28 days, THEN 0.5 mg every 7 (seven) days., Disp: 9 mL, Rfl: 3  tamsulosin (FLOMAX) 0.4 MG CAPS capsule, Take 1 capsule by mouth twice daily, Disp: 180 capsule, Rfl: 3 Social History   Socioeconomic History   Marital status: Married    Spouse name: Not on file   Number of children: 2   Years of education: 15   Highest education level: Some college, no degree  Occupational History   Occupation: Retired    Comment: Librarian, academic  Tobacco Use   Smoking status: Former    Packs/day: 2.00    Years: 40.00    Additional pack years: 0.00    Total pack years: 80.00    Types: Cigarettes    Quit date: 12/06/2006     Years since quitting: 16.4   Smokeless tobacco: Never  Vaping Use   Vaping Use: Never used  Substance and Sexual Activity   Alcohol use: Yes    Alcohol/week: 0.0 standard drinks of alcohol    Comment: VERY LITTLE   Drug use: No   Sexual activity: Not on file  Other Topics Concern   Not on file  Social History Narrative   Not on file   Social Determinants of Health   Financial Resource Strain: Low Risk  (12/09/2022)   Overall Financial Resource Strain (CARDIA)    Difficulty of Paying Living Expenses: Not hard at all  Food Insecurity: No Food Insecurity (12/09/2022)   Hunger Vital Sign    Worried About Running Out of Food in the Last Year: Never true    Ran Out of Food in the Last Year: Never true  Transportation Needs: No Transportation Needs (12/09/2022)   PRAPARE - Administrator, Civil Service (Medical): No    Lack of Transportation (Non-Medical): No  Physical Activity: Insufficiently Active (12/09/2022)   Exercise Vital Sign    Days of Exercise per Week: 3 days    Minutes of Exercise per Session: 30 min  Stress: No Stress Concern Present (12/09/2022)   [employer] of Occupational Health - Occupational Stress Questionnaire    Feeling of Stress : Not at all  Social Connections: Socially Integrated (12/09/2022)   Social Connection and Isolation Panel [NHANES]    Frequency of Communication with Friends and Family: More than three times a week    Frequency of Social Gatherings with Friends and Family: More than three times a week    Attends Religious Services: More than 4 times per year    Active Member of [employer] or Organizations: Yes    Attends Engineer, structural: More than 4 times per year    Marital Status: Married  Catering manager Violence: Not At Risk (12/09/2022)   Humiliation, Afraid, Rape, and Kick questionnaire    Fear of Current or Ex-Partner: No    Emotionally Abused: No    Physically Abused: No    Sexually Abused: No   Family History   Problem Relation Age of Onset   Asthma Mother    COPD Mother    Cancer Maternal Grandmother        stomach   Early death Father 75       Tractor Accident    Objective: Office vital signs reviewed. BP 125/71   Pulse 100   Temp 98.7 F (37.1 C)   Ht 5\' 7"  (1.702 m)   Wt 193 lb (87.5 kg)   SpO2 90%   BMI 30.23 kg/m   Physical Examination:  General: Awake, alert, well nourished, No acute distress HEENT: sclera white, MMM Cardio: regular rate and rhythm,  S1S2 heard, no murmurs appreciated Pulm: Mild expiratory wheezes noted at the bases bilaterally.  Normal work of breathing on room air with no coughing observed    Assessment/ Plan: 82 y.o. male   Type 2 diabetes mellitus with other specified complication, without long-term current use of insulin (HCC) - Plan: Microalbumin / creatinine urine ratio, Bayer DCA Hb A1c Waived, Basic Metabolic Panel, CBC, metFORMIN (GLUCOPHAGE) 500 MG tablet, Semaglutide,0.25 or 0.5MG /DOS, (OZEMPIC, 0.25 OR 0.5 MG/DOSE,) 2 MG/3ML SOPN  Hypertension associated with diabetes (HCC) - Plan: Basic Metabolic Panel, lisinopril (ZESTRIL) 10 MG tablet  Hyperlipidemia associated with type 2 diabetes mellitus (HCC) - Plan: pravastatin (PRAVACHOL) 40 MG tablet  BPH with urinary obstruction - Plan: finasteride (PROSCAR) 5 MG tablet, tamsulosin (FLOMAX) 0.4 MG CAPS capsule  Chronic obstructive pulmonary disease, unspecified COPD type (HCC) - Plan: Albuterol Sulfate (PROAIR RESPICLICK) 108 (90 Base) MCG/ACT AEPB, Fluticasone-Umeclidin-Vilant (TRELEGY ELLIPTA) 100-62.5-25 MCG/ACT AEPB  Sugar well-controlled with A1c at 6.2.  No changes to medication regimen needed but I will substitute the Trulicity for the Ozempic going forward given his issues obtaining Trulicity.  This has been sent to [facility] per his request.  All other medications were renewed and sent to pharmacy including medications that are managed by his urologist and pulmonologist for ease.  Future  refills per their expertise and recommendations  No orders of the defined types were placed in this encounter.  No orders of the defined types were placed in this encounter.    Raliegh Ip, DO [facility] [phone]

## 2023-05-18 LAB — BASIC METABOLIC PANEL
BUN/Creatinine Ratio: 19 (ref 10–24)
BUN: 15 mg/dL (ref 8–27)
CO2: 25 mmol/L (ref 20–29)
Calcium: 9.1 mg/dL (ref 8.6–10.2)
Chloride: 104 mmol/L (ref 96–106)
Creatinine, Ser: 0.8 mg/dL (ref 0.76–1.27)
Glucose: 115 mg/dL — ABNORMAL HIGH (ref 70–99)
Potassium: 4.8 mmol/L (ref 3.5–5.2)
Sodium: 140 mmol/L (ref 134–144)
eGFR: 89 mL/min/{1.73_m2} (ref >59)

## 2023-05-18 LAB — CBC
Hematocrit: 46.1 % (ref 37.5–51.0)
Hemoglobin: 15 g/dL (ref 13.0–17.7)
MCH: 30.8 pg (ref 26.6–33.0)
MCHC: 32.5 g/dL (ref 31.5–35.7)
MCV: 95 fL (ref 79–97)
Platelets: 154 10*3/uL (ref 150–450)
RBC: 4.87 x10E6/uL (ref 4.14–5.80)
RDW: 13.1 % (ref 11.6–15.4)
WBC: 6.2 10*3/uL (ref 3.4–10.8)

## 2023-05-18 LAB — MICROALBUMIN / CREATININE URINE RATIO
Creatinine, Urine: 56.7 mg/dL
Microalb/Creat Ratio: 63 mg/g creat — ABNORMAL HIGH (ref 0–29)
Microalbumin, Urine: 36 ug/mL

## 2023-05-19 ENCOUNTER — Other Ambulatory Visit: Payer: Self-pay | Admitting: Emergency Medicine

## 2023-05-19 ENCOUNTER — Encounter: Payer: Self-pay | Admitting: Family Medicine

## 2023-05-19 DIAGNOSIS — J449 Chronic obstructive pulmonary disease, unspecified: Secondary | ICD-10-CM

## 2023-05-20 ENCOUNTER — Other Ambulatory Visit: Payer: Self-pay | Admitting: Family Medicine

## 2023-05-20 DIAGNOSIS — E1169 Type 2 diabetes mellitus with other specified complication: Secondary | ICD-10-CM

## 2023-05-20 DIAGNOSIS — J449 Chronic obstructive pulmonary disease, unspecified: Secondary | ICD-10-CM

## 2023-05-20 MED ORDER — TRELEGY ELLIPTA 100-62.5-25 MCG/ACT IN AEPB: INHALATION_SPRAY | RESPIRATORY_TRACT | 3 refills | Status: DC

## 2023-05-24 DIAGNOSIS — Z9889 Other specified postprocedural states: Secondary | ICD-10-CM | POA: Diagnosis not present

## 2023-05-24 DIAGNOSIS — Z1330 Encounter for screening examination for mental health and behavioral disorders, unspecified: Secondary | ICD-10-CM | POA: Diagnosis not present

## 2023-05-24 DIAGNOSIS — M5416 Radiculopathy, lumbar region: Secondary | ICD-10-CM | POA: Diagnosis not present

## 2023-05-24 DIAGNOSIS — M5136 Other intervertebral disc degeneration, lumbar region: Secondary | ICD-10-CM | POA: Diagnosis not present

## 2023-06-12 LAB — HM DIABETES EYE EXAM

## 2023-07-19 DIAGNOSIS — Z9889 Other specified postprocedural states: Secondary | ICD-10-CM | POA: Diagnosis not present

## 2023-07-19 DIAGNOSIS — M5416 Radiculopathy, lumbar region: Secondary | ICD-10-CM | POA: Diagnosis not present

## 2023-07-19 DIAGNOSIS — M5136 Other intervertebral disc degeneration, lumbar region: Secondary | ICD-10-CM | POA: Diagnosis not present

## 2023-07-29 ENCOUNTER — Encounter: Payer: Self-pay | Admitting: Emergency Medicine

## 2023-07-29 ENCOUNTER — Ambulatory Visit: Payer: Medicare Other | Admitting: Emergency Medicine

## 2023-07-29 VITALS — BP 118/68 | HR 72 | Ht 65.0 in | Wt 189.6 lb

## 2023-07-29 DIAGNOSIS — J449 Chronic obstructive pulmonary disease, unspecified: Secondary | ICD-10-CM

## 2023-07-29 DIAGNOSIS — J9611 Chronic respiratory failure with hypoxia: Secondary | ICD-10-CM | POA: Diagnosis not present

## 2023-07-29 DIAGNOSIS — R911 Solitary pulmonary nodule: Secondary | ICD-10-CM

## 2023-07-29 DIAGNOSIS — G4733 Obstructive sleep apnea (adult) (pediatric): Secondary | ICD-10-CM

## 2023-07-29 NOTE — Assessment & Plan Note (Signed)
No flares, reliable with his Trelegy.  No prednisone, antibiotics.  Suspect that his progressive dyspnea is because he has poor oxygen compliance and poor CPAP compliance and probably progressive PAH.  Please continue your Trelegy 1 inhalation once daily.  Rinse and gargle after using. Keep your albuterol available to use 2 puffs when needed for shortness of breath, chest tightness, wheezing.  We will reorder this through your pharmacy, try to get you a nonpowdered version Follow with Dr. Delton Coombes in 1 year, sooner if you have problems.

## 2023-07-29 NOTE — Assessment & Plan Note (Signed)
Noncompliant.  Counseled him today on more reliable usage.  I do believe it likely is contributing to his decline over the last year.

## 2023-07-29 NOTE — Progress Notes (Signed)
Subjective:    Patient ID: Brent Snow, male    DOB: [birth year], 82 y.o.   MRN: [id number]  COPD He complains of shortness of breath. There is no cough or wheezing. Pertinent negatives include no ear pain, fever, headaches, postnasal drip, rhinorrhea, sneezing, sore throat or trouble swallowing. His past medical history is significant for COPD.   CT chest 05/24/2022 reviewed by me showed that the 3.1 cm right lower lobe mass remained stable, unchanged going back to at least 2014 (possibly 2008).  His left lower lobe and right upper lobe nodules are unchanged.  There is some new subtle tree-in-bud nodularity in the right upper lobe consistent with inflammatory change as well as some new 1 to 2 mm nodules in the left upper lobe again most likely inflammatory. Currently managed on Trelegy, has albuterol which he uses approximately He has a persistent dry cough, has had more exertional dyspnea than the last time I saw him. He is not wearing his O2 w exertion, rarely uses it. He is not wearing his CPAP. He has been using some ProAir Respiclick, unsure whether he is using correctly. No flares, pre d  ROV 07/29/2023 --Brent Snow is a pleasant 82 year old man with a history of chronic hypoxemic respiratory failure from very severe COPD.  He also has OSA with marginal CPAP compliance.  I have followed him for an abnormal CT scan of the chest with an area of right lower lobe hamartoma or rounded atelectasis 3.1 cm, scattered pulmonary nodules and some tree-in-bud inflammatory change.                                                                Review of Systems  Constitutional:  Negative for fever and unexpected weight change.  HENT:  Negative for congestion, dental problem, ear pain, nosebleeds, postnasal drip, rhinorrhea, sinus pressure, sneezing, sore throat and trouble swallowing.   Eyes:  Negative for redness and itching.  Respiratory:  Positive for shortness of breath. Negative for cough, chest  tightness and wheezing.   Cardiovascular:  Negative for palpitations and leg swelling.  Gastrointestinal:  Negative for nausea and vomiting.  Genitourinary:  Negative for dysuria.  Musculoskeletal:  Negative for joint swelling.       Pain and stiffness  Skin:  Negative for rash.  Neurological:  Negative for headaches.  Hematological:  Does not bruise/bleed easily.  Psychiatric/Behavioral:  Negative for dysphoric mood and suicidal ideas. The patient is not nervous/anxious.       Objective:   Physical Exam Vitals:   07/29/23 1353  BP: 118/68  Pulse: 72  SpO2: 90%  Weight: 189 lb 9.6 oz (86 kg)  Height: 5\' 5"  (1.651 m)   Gen: Pleasant, overweight, in no distress,  normal affect  ENT: No lesions,  mouth clear,  oropharynx clear, no postnasal drip  Neck: No JVD, no stridor  Lungs: No use of accessory muscles, distant, no wheezing  Cardiovascular: RRR, heart sounds normal, no murmur or gallops, no peripheral edema  Musculoskeletal: No deformities, no cyanosis or clubbing  Neuro: alert, non focal  Skin: Warm, no lesions or rashes     Assessment & Plan:  COPD (chronic obstructive pulmonary disease) No flares, reliable with his Trelegy.  No prednisone, antibiotics.  Suspect that his progressive  dyspnea is because he has poor oxygen compliance and poor CPAP compliance and probably progressive PAH.  Please continue your Trelegy 1 inhalation once daily.  Rinse and gargle after using. Keep your albuterol available to use 2 puffs when needed for shortness of breath, chest tightness, wheezing.  We will reorder this through your pharmacy, try to get you a nonpowdered version Follow with Dr. Delton Coombes in 1 year, sooner if you have problems.  Obstructive sleep apnea Noncompliant.  Counseled him today on more reliable usage.  I do believe it likely is contributing to his decline over the last year.  Chronic hypoxemic respiratory failure Never uses oxygen with exertion.  He sees  desaturations into the 80s.  Unsure that this is a contributor to his progressive decline.  Encouraged him to wear his O2 reliably with all exertion  Lung nodule seen on imaging study Scattered small pulmonary nodules.  He has a stable right lower lobe rounded opacity, hamartoma or rounded atelectasis.  He has not had his surveillance CT chest this year.  I will order now.   Brent Pupa, MD, PhD 07/29/2023, 2:31 PM [facility] [phone] or if no answer [phone]

## 2023-07-29 NOTE — Assessment & Plan Note (Signed)
Scattered small pulmonary nodules.  He has a stable right lower lobe rounded opacity, hamartoma or rounded atelectasis.  He has not had his surveillance CT chest this year.  I will order now.

## 2023-07-29 NOTE — Patient Instructions (Addendum)
Please continue your Trelegy 1 elation once daily.  Rinse and gargle after using. Keep your albuterol available to use 2 puffs when needed for shortness of breath, chest tightness, wheezing.  We will reorder this through your pharmacy, try to get you a nonpowdered version Try to wear your oxygen more reliably with exertion Try to wear your CPAP more reliably at night We will arrange for a CT scan of your chest at [facility] Follow with Dr. Delton Coombes in 1 year, sooner if you have problems.

## 2023-07-29 NOTE — Assessment & Plan Note (Signed)
Never uses oxygen with exertion.  He sees desaturations into the 80s.  Unsure that this is a contributor to his progressive decline.  Encouraged him to wear his O2 reliably with all exertion

## 2023-09-06 ENCOUNTER — Ambulatory Visit (HOSPITAL_COMMUNITY): Payer: Medicare Other

## 2023-09-21 ENCOUNTER — Ambulatory Visit (HOSPITAL_COMMUNITY)
Admission: RE | Admit: 2023-09-21 | Discharge: 2023-09-21 | Disposition: A | Discharge status: Home / Self Care | Payer: Medicare Other | Source: Ambulatory Visit | Attending: Emergency Medicine | Admitting: Emergency Medicine

## 2023-09-21 DIAGNOSIS — R911 Solitary pulmonary nodule: Secondary | ICD-10-CM

## 2023-09-21 DIAGNOSIS — R918 Other nonspecific abnormal finding of lung field: Secondary | ICD-10-CM | POA: Diagnosis not present

## 2023-09-21 DIAGNOSIS — J432 Centrilobular emphysema: Secondary | ICD-10-CM | POA: Diagnosis not present

## 2023-09-30 ENCOUNTER — Encounter: Payer: Self-pay | Admitting: Emergency Medicine

## 2023-10-19 DIAGNOSIS — Z9889 Other specified postprocedural states: Secondary | ICD-10-CM | POA: Diagnosis not present

## 2023-10-19 DIAGNOSIS — M5416 Radiculopathy, lumbar region: Secondary | ICD-10-CM | POA: Diagnosis not present

## 2023-10-19 DIAGNOSIS — M51362 Other intervertebral disc degeneration, lumbar region with discogenic back pain and lower extremity pain: Secondary | ICD-10-CM | POA: Diagnosis not present

## 2023-11-08 ENCOUNTER — Telehealth: Payer: Self-pay

## 2023-11-08 NOTE — Telephone Encounter (Signed)
Brent Snow (Key: [id number]) Ozempic (0.25 or 0.5 MG/DOSE) 2MG /3ML pen-injectors Form Ambulance person PA Form (2017 NCPDP) Created 16 hours ago Sent to Plan 3 minutes ago Plan Response 3 minutes ago Submit Clinical Questions less than a minute ago Determination Favorable less than a minute ago Message from [employer] Your PA request has been approved. Additional information will be provided in the approval communication. (Message 1145). Authorization Expiration Date: November 07, 2024.

## 2023-11-09 NOTE — Telephone Encounter (Signed)
Pharmacy Patient Advocate Encounter  Received notification from [facility] that Prior Authorization for Ozempic (0.25 or 0.5 MG/DOSE) 2MG /3ML pen-injectors has been APPROVED from 10/09/23 to 11/07/24   PA #/Case ID/Reference #: [id number]

## 2023-11-16 ENCOUNTER — Ambulatory Visit: Payer: Medicare Other | Admitting: Family Medicine

## 2023-11-16 ENCOUNTER — Encounter: Payer: Self-pay | Admitting: Family Medicine

## 2023-11-16 VITALS — BP 138/82 | HR 66 | Temp 98.4°F | Ht 65.0 in | Wt 191.0 lb

## 2023-11-16 DIAGNOSIS — E1159 Type 2 diabetes mellitus with other circulatory complications: Secondary | ICD-10-CM | POA: Diagnosis not present

## 2023-11-16 DIAGNOSIS — I152 Hypertension secondary to endocrine disorders: Secondary | ICD-10-CM | POA: Diagnosis not present

## 2023-11-16 DIAGNOSIS — Z23 Encounter for immunization: Secondary | ICD-10-CM | POA: Diagnosis not present

## 2023-11-16 DIAGNOSIS — E1169 Type 2 diabetes mellitus with other specified complication: Secondary | ICD-10-CM

## 2023-11-16 DIAGNOSIS — Z7985 Long-term (current) use of injectable non-insulin antidiabetic drugs: Secondary | ICD-10-CM | POA: Diagnosis not present

## 2023-11-16 DIAGNOSIS — T148XXA Other injury of unspecified body region, initial encounter: Secondary | ICD-10-CM

## 2023-11-16 DIAGNOSIS — E785 Hyperlipidemia, unspecified: Secondary | ICD-10-CM | POA: Diagnosis not present

## 2023-11-16 LAB — BAYER DCA HB A1C WAIVED: HB A1C (BAYER DCA - WAIVED): 6.8 % — ABNORMAL HIGH (ref 4.8–5.6)

## 2023-11-16 NOTE — Progress Notes (Signed)
Subjective: CC:DM PCP: Raliegh Ip, DO ZOX:WRUEAV [facility] is a 82 y.o. male presenting to clinic today for:  1. Type 2 Diabetes with hypertension, hyperlipidemia:  Patient is accompanied today's visit by his wife.  He notes that he has been doing okay on the Ozempic.  For some reason he thought that he was supposed to go back from 0.5 mg weekly to 0.25 mg weekly.  He was confused about this and has only been injecting 0.25 mg over the last several weeks.  He reports no GI symptoms even at the higher dose.  He does report some appetite suppression.  Diabetes Health Maintenance Due  Topic Date Due   HEMOGLOBIN A1C  11/16/2023   FOOT EXAM  05/16/2024   OPHTHALMOLOGY EXAM  06/11/2024    Last A1c:  Lab Results  Component Value Date   HGBA1C 6.2 (H) 05/17/2023    ROS: No chest pain, shortness of breath or falls reported.  However he continues to neck himself here and there bumping into things. Skin tears easily.  ROS: Per HPI  No Known Allergies Past Medical History:  Diagnosis Date   Arthritis    COPD, severe (HCC)    pulmologist-  dr byrum   Dyspnea on exertion    prescribed to use O2 via [place]--- per pt he does not use O2 he just take 1-2 puffs of rescue inhaler   Hiatal hernia    History of acute respiratory failure    02-18-2007  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   Hypertension    Hypoxemic respiratory failure, chronic (HCC)    pulmologist-  dr byrum-- prescribed supplemental O2 @ 2L via [place]    OSA on CPAP    and added O2 @ 2L via [place] w/ cpap   Phimosis    S/P balloon dilatation of esophageal stricture    multiple    Type 2 diabetes mellitus (HCC)    followed by pcp--  per note uncontrolled w/ last A1c >14 on 12-30-2016   Wears contact lenses     Current Outpatient Medications:    Albuterol Sulfate (PROAIR RESPICLICK) 108 (90 Base) MCG/ACT AEPB, Inhale 2 puffs into the lungs every 6 (six) hours as needed., Disp: 1 each, Rfl: 5   finasteride  (PROSCAR) 5 MG tablet, Take 1 tablet (5 mg total) by mouth daily., Disp: 90 tablet, Rfl: 3   Fluticasone-Umeclidin-Vilant (TRELEGY ELLIPTA) 100-62.5-25 MCG/ACT AEPB, USE 1 INHALATION ORALLY    DAILY, Disp: 180 each, Rfl: 3   lisinopril (ZESTRIL) 10 MG tablet, Take 1 tablet (10 mg total) by mouth daily., Disp: 90 tablet, Rfl: 3   metFORMIN (GLUCOPHAGE) 500 MG tablet, Take 2 tablets (1,000 mg total) by mouth daily with breakfast., Disp: 180 tablet, Rfl: 3   pravastatin (PRAVACHOL) 40 MG tablet, Take 1 tablet (40 mg total) by mouth daily., Disp: 90 tablet, Rfl: 3   Semaglutide,0.25 or 0.5MG /DOS, (OZEMPIC, 0.25 OR 0.5 MG/DOSE,) 2 MG/3ML SOPN, Inject 0.5 mg into the skin every 7 (seven) days., Disp: 9 mL, Rfl: 3   tamsulosin (FLOMAX) 0.4 MG CAPS capsule, Take 1 capsule (0.4 mg total) by mouth 2 (two) times daily., Disp: 180 capsule, Rfl: 3 Social History   Socioeconomic History   Marital status: Married    Spouse name: Not on file   Number of children: 2   Years of education: 15   Highest education level: Some college, no degree  Occupational History   Occupation: Retired  Comment: Librarian, academic  Tobacco Use   Smoking status: Former    Current packs/day: 0.00    Average packs/day: 2.0 packs/day for 40.0 years (80.0 ttl pk-yrs)    Types: Cigarettes    Start date: 12/06/1966    Quit date: 12/06/2006    Years since quitting: 16.9   Smokeless tobacco: Never  Vaping Use   Vaping status: Never Used  Substance and Sexual Activity   Alcohol use: Yes    Alcohol/week: 0.0 standard drinks of alcohol    Comment: VERY LITTLE   Drug use: No   Sexual activity: Not on file  Other Topics Concern   Not on file  Social History Narrative   Not on file   Social Determinants of Health   Financial Resource Strain: Low Risk  (05/24/2023)   Received from [facility]   Overall Financial Resource Strain (CARDIA)    Difficulty of Paying Living Expenses: Not hard at all  Food  Insecurity: No Food Insecurity (05/24/2023)   [facility]   Hunger Vital Sign    Worried About Running Out of Food in the Last Year: Never true    Ran Out of Food in the Last Year: Never true  Transportation Needs: No Transportation Needs (05/24/2023)   Received from [facility] - Transportation    Lack of Transportation (Medical): No    Lack of Transportation (Non-Medical): No  Physical Activity: Insufficiently Active (12/09/2022)   Exercise Vital Sign    Days of Exercise per Week: 3 days    Minutes of Exercise per Session: 30 min  Stress: No Stress Concern Present (12/09/2022)   [employer] of Occupational Health - Occupational Stress Questionnaire    Feeling of Stress : Not at all  Social Connections: Socially Integrated (12/09/2022)   Social Connection and Isolation Panel [NHANES]    Frequency of Communication with Friends and Family: More than three times a week    Frequency of Social Gatherings with Friends and Family: More than three times a week    Attends Religious Services: More than 4 times per year    Active Member of [employer] or Organizations: Yes    Attends Engineer, structural: More than 4 times per year    Marital Status: Married  Catering manager Violence: Not At Risk (12/09/2022)   Humiliation, Afraid, Rape, and Kick questionnaire    Fear of Current or Ex-Partner: No    Emotionally Abused: No    Physically Abused: No    Sexually Abused: No   Family History  Problem Relation Age of Onset   Asthma Mother    COPD Mother    Cancer Maternal Grandmother        stomach   Early death Father 15       Tractor Accident    Objective: Office vital signs reviewed. Pulse 66   Temp 98.4 F (36.9 C)   Ht 5\' 5"  (1.651 Brent)   Wt 191 lb (86.6 kg)   SpO2 92%   BMI 31.78 kg/Brent   Physical Examination:  General: Awake, alert, well nourished, obese elderly male.  No acute distress HEENT: Sclera white.  Moist mucous membranes Cardio: regular  rate and rhythm, S1S2 heard, no murmurs appreciated Pulm: clear to auscultation bilaterally, no wheezes, rhonchi or rales; normal work of breathing on room air Skin: dry; several skin tears along the upper extremities and lower extremities without any evidence of active bleeding or secondary bacterial infection  Lab Results  Component Value Date   HGBA1C 6.8 (H) 11/16/2023    Assessment/ Plan: 82 y.o. male   Type 2 diabetes mellitus with other specified complication, without long-term current use of insulin (HCC) - Plan: Bayer DCA Hb A1c Waived, Lipid Panel, CMP14+EGFR  Hyperlipidemia associated with type 2 diabetes mellitus (HCC) - Plan: Lipid Panel, CMP14+EGFR  Hypertension associated with diabetes (HCC) - Plan: CMP14+EGFR  Encounter for immunization - Plan: Flu Vaccine Trivalent High Dose (Fluad)  Multiple skin tears  Sugar under good control with A1c of 6.8.  Anticipate even tighter control once he is able to titrate back up to 0.5 mg weekly.  We discussed the benefit of this particular GLP over Trulicity as it does have superior cardiovascular protection.  For this reason they would like to continue.  However if needing simplified injector we could consider Mounjaro which does also have some up and coming cardiovascular data.   Check fasting lipid.  Continue statin.   Blood pressure controlled upon recheck.  Continue lisinopril.  Check renal function  Influenza vaccination administered  Multiple skin tears without evidence of complication.  Hydration of skin encouraged.    He will follow-up with me in 4 to 6 months, sooner if concerns arise  Raliegh Ip, DO [facility] [phone]

## 2023-11-17 LAB — CMP14+EGFR
ALT: 14 [IU]/L (ref 0–44)
AST: 16 [IU]/L (ref 0–40)
Albumin: 4.1 g/dL (ref 3.8–4.8)
Alkaline Phosphatase: 87 [IU]/L (ref 44–121)
BUN/Creatinine Ratio: 13 (ref 10–24)
BUN: 12 mg/dL (ref 8–27)
Bilirubin Total: 0.4 mg/dL (ref 0.0–1.2)
CO2: 20 mmol/L (ref 20–29)
Calcium: 9 mg/dL (ref 8.6–10.2)
Chloride: 103 mmol/L (ref 96–106)
Creatinine, Ser: 0.93 mg/dL (ref 0.76–1.27)
Globulin, Total: 2.6 g/dL (ref 1.5–4.5)
Glucose: 124 mg/dL — ABNORMAL HIGH (ref 70–99)
Potassium: 4.8 mmol/L (ref 3.5–5.2)
Sodium: 141 mmol/L (ref 134–144)
Total Protein: 6.7 g/dL (ref 6.0–8.5)
eGFR: 83 mL/min/{1.73_m2} (ref >59)

## 2023-11-17 LAB — LIPID PANEL
Chol/HDL Ratio: 2.4 {ratio} (ref 0.0–5.0)
Cholesterol, Total: 124 mg/dL (ref 100–199)
HDL: 52 mg/dL (ref >39)
LDL Chol Calc (NIH): 58 mg/dL (ref 0–99)
Triglycerides: 64 mg/dL (ref 0–149)
VLDL Cholesterol Cal: 14 mg/dL (ref 5–40)

## 2023-12-02 ENCOUNTER — Other Ambulatory Visit: Payer: Self-pay

## 2023-12-02 ENCOUNTER — Inpatient Hospital Stay (HOSPITAL_BASED_OUTPATIENT_CLINIC_OR_DEPARTMENT_OTHER)
Admission: EM | Admit: 2023-12-02 | Discharge: 2023-12-04 | DRG: 083 | Disposition: A | Discharge status: Home / Self Care | Payer: Medicare Other | Attending: Internal Medicine | Admitting: Internal Medicine

## 2023-12-02 ENCOUNTER — Emergency Department (HOSPITAL_BASED_OUTPATIENT_CLINIC_OR_DEPARTMENT_OTHER): Payer: Medicare Other

## 2023-12-02 ENCOUNTER — Encounter (HOSPITAL_COMMUNITY): Payer: Self-pay | Admitting: Family Medicine

## 2023-12-02 ENCOUNTER — Emergency Department (HOSPITAL_BASED_OUTPATIENT_CLINIC_OR_DEPARTMENT_OTHER): Payer: Medicare Other | Admitting: Radiology

## 2023-12-02 DIAGNOSIS — Y92009 Unspecified place in unspecified non-institutional (private) residence as the place of occurrence of the external cause: Secondary | ICD-10-CM | POA: Diagnosis not present

## 2023-12-02 DIAGNOSIS — S066X0A Traumatic subarachnoid hemorrhage without loss of consciousness, initial encounter: Secondary | ICD-10-CM | POA: Diagnosis not present

## 2023-12-02 DIAGNOSIS — I609 Nontraumatic subarachnoid hemorrhage, unspecified: Principal | ICD-10-CM

## 2023-12-02 DIAGNOSIS — S0003XA Contusion of scalp, initial encounter: Secondary | ICD-10-CM | POA: Diagnosis not present

## 2023-12-02 DIAGNOSIS — N400 Enlarged prostate without lower urinary tract symptoms: Secondary | ICD-10-CM | POA: Diagnosis not present

## 2023-12-02 DIAGNOSIS — Z87820 Personal history of traumatic brain injury: Secondary | ICD-10-CM

## 2023-12-02 DIAGNOSIS — S0281XA Fracture of other specified skull and facial bones, right side, initial encounter for closed fracture: Secondary | ICD-10-CM | POA: Diagnosis not present

## 2023-12-02 DIAGNOSIS — S51012A Laceration without foreign body of left elbow, initial encounter: Secondary | ICD-10-CM | POA: Diagnosis not present

## 2023-12-02 DIAGNOSIS — Z87891 Personal history of nicotine dependence: Secondary | ICD-10-CM | POA: Diagnosis not present

## 2023-12-02 DIAGNOSIS — W19XXXA Unspecified fall, initial encounter: Secondary | ICD-10-CM | POA: Diagnosis not present

## 2023-12-02 DIAGNOSIS — J9611 Chronic respiratory failure with hypoxia: Secondary | ICD-10-CM | POA: Diagnosis not present

## 2023-12-02 DIAGNOSIS — M47816 Spondylosis without myelopathy or radiculopathy, lumbar region: Secondary | ICD-10-CM | POA: Diagnosis not present

## 2023-12-02 DIAGNOSIS — I10 Essential (primary) hypertension: Secondary | ICD-10-CM | POA: Diagnosis not present

## 2023-12-02 DIAGNOSIS — S065X0A Traumatic subdural hemorrhage without loss of consciousness, initial encounter: Secondary | ICD-10-CM | POA: Diagnosis not present

## 2023-12-02 DIAGNOSIS — Z79899 Other long term (current) drug therapy: Secondary | ICD-10-CM | POA: Diagnosis not present

## 2023-12-02 DIAGNOSIS — Z825 Family history of asthma and other chronic lower respiratory diseases: Secondary | ICD-10-CM

## 2023-12-02 DIAGNOSIS — Z7984 Long term (current) use of oral hypoglycemic drugs: Secondary | ICD-10-CM | POA: Diagnosis not present

## 2023-12-02 DIAGNOSIS — E119 Type 2 diabetes mellitus without complications: Secondary | ICD-10-CM

## 2023-12-02 DIAGNOSIS — S065XAA Traumatic subdural hemorrhage with loss of consciousness status unknown, initial encounter: Secondary | ICD-10-CM | POA: Diagnosis present

## 2023-12-02 DIAGNOSIS — J449 Chronic obstructive pulmonary disease, unspecified: Secondary | ICD-10-CM | POA: Diagnosis not present

## 2023-12-02 DIAGNOSIS — D72829 Elevated white blood cell count, unspecified: Secondary | ICD-10-CM | POA: Diagnosis not present

## 2023-12-02 DIAGNOSIS — S299XXA Unspecified injury of thorax, initial encounter: Secondary | ICD-10-CM | POA: Diagnosis not present

## 2023-12-02 DIAGNOSIS — S020XXA Fracture of vault of skull, initial encounter for closed fracture: Secondary | ICD-10-CM | POA: Diagnosis present

## 2023-12-02 DIAGNOSIS — Z9181 History of falling: Secondary | ICD-10-CM | POA: Diagnosis not present

## 2023-12-02 DIAGNOSIS — Z7985 Long-term (current) use of injectable non-insulin antidiabetic drugs: Secondary | ICD-10-CM | POA: Diagnosis not present

## 2023-12-02 DIAGNOSIS — G4733 Obstructive sleep apnea (adult) (pediatric): Secondary | ICD-10-CM | POA: Diagnosis present

## 2023-12-02 DIAGNOSIS — S0219XA Other fracture of base of skull, initial encounter for closed fracture: Secondary | ICD-10-CM

## 2023-12-02 DIAGNOSIS — W109XXA Fall (on) (from) unspecified stairs and steps, initial encounter: Secondary | ICD-10-CM | POA: Diagnosis present

## 2023-12-02 DIAGNOSIS — S3993XA Unspecified injury of pelvis, initial encounter: Secondary | ICD-10-CM | POA: Diagnosis not present

## 2023-12-02 DIAGNOSIS — S066XAA Traumatic subarachnoid hemorrhage with loss of consciousness status unknown, initial encounter: Secondary | ICD-10-CM | POA: Diagnosis not present

## 2023-12-02 DIAGNOSIS — R918 Other nonspecific abnormal finding of lung field: Secondary | ICD-10-CM | POA: Diagnosis not present

## 2023-12-02 HISTORY — DX: Traumatic subdural hemorrhage with loss of consciousness status unknown, initial encounter: S06.5XAA

## 2023-12-02 LAB — CBC WITH DIFFERENTIAL/PLATELET
Abs Immature Granulocytes: 0.06 10*3/uL (ref 0.00–0.07)
Basophils Absolute: 0 10*3/uL (ref 0.0–0.1)
Basophils Relative: 0 %
Eosinophils Absolute: 0 10*3/uL (ref 0.0–0.5)
Eosinophils Relative: 0 %
HCT: 45.6 % (ref 39.0–52.0)
Hemoglobin: 14.9 g/dL (ref 13.0–17.0)
Immature Granulocytes: 1 %
Lymphocytes Relative: 7 %
Lymphs Abs: 0.8 10*3/uL (ref 0.7–4.0)
MCH: 31 pg (ref 26.0–34.0)
MCHC: 32.7 g/dL (ref 30.0–36.0)
MCV: 94.8 fL (ref 80.0–100.0)
Monocytes Absolute: 0.6 10*3/uL (ref 0.1–1.0)
Monocytes Relative: 5 %
Neutro Abs: 9.5 10*3/uL — ABNORMAL HIGH (ref 1.7–7.7)
Neutrophils Relative %: 87 %
Platelets: 173 10*3/uL (ref 150–400)
RBC: 4.81 MIL/uL (ref 4.22–5.81)
RDW: 12.7 % (ref 11.5–15.5)
WBC: 11 10*3/uL — ABNORMAL HIGH (ref 4.0–10.5)
nRBC: 0 % (ref 0.0–0.2)

## 2023-12-02 LAB — BASIC METABOLIC PANEL
Anion gap: 6 (ref 5–15)
BUN: 16 mg/dL (ref 8–23)
CO2: 28 mmol/L (ref 22–32)
Calcium: 8.7 mg/dL — ABNORMAL LOW (ref 8.9–10.3)
Chloride: 102 mmol/L (ref 98–111)
Creatinine, Ser: 0.84 mg/dL (ref 0.61–1.24)
GFR, Estimated: >60 mL/min (ref >60)
Glucose, Bld: 204 mg/dL — ABNORMAL HIGH (ref 70–99)
Potassium: 3.7 mmol/L (ref 3.5–5.1)
Sodium: 136 mmol/L (ref 135–145)

## 2023-12-02 LAB — CBG MONITORING, ED: Glucose-Capillary: 116 mg/dL — ABNORMAL HIGH (ref 70–99)

## 2023-12-02 LAB — PROTIME-INR
INR: 1 (ref 0.8–1.2)
Prothrombin Time: 13.5 s (ref 11.4–15.2)

## 2023-12-02 LAB — CK: Total CK: 54 U/L (ref 49–397)

## 2023-12-02 MED ORDER — LORAZEPAM 2 MG/ML IJ SOLN: 2.0000 mg | Freq: Once | INTRAMUSCULAR | Status: DC | PRN

## 2023-12-02 MED ORDER — LISINOPRIL 10 MG PO TABS
10.0000 mg | ORAL_TABLET | Freq: Every day | ORAL | Status: DC
  Administered 2023-12-03 – 2023-12-04 (×2): 10 mg via ORAL
  Filled 2023-12-02 (×2): qty 1

## 2023-12-02 MED ORDER — ACETAMINOPHEN 650 MG RE SUPP: 650.0000 mg | Freq: Four times a day (QID) | RECTAL | Status: DC | PRN

## 2023-12-02 MED ORDER — METOPROLOL TARTRATE 5 MG/5ML IV SOLN
5.0000 mg | Freq: Once | INTRAVENOUS | Status: AC
Start: 2023-12-02 — End: 2023-12-02
  Administered 2023-12-02: 5 mg via INTRAVENOUS
  Filled 2023-12-02: qty 5

## 2023-12-02 MED ORDER — LISINOPRIL 20 MG PO TABS
20.0000 mg | ORAL_TABLET | Freq: Once | ORAL | Status: AC
Start: 2023-12-02 — End: 2023-12-02
  Administered 2023-12-02: 20 mg via ORAL
  Filled 2023-12-02: qty 1

## 2023-12-02 MED ORDER — PRAVASTATIN SODIUM 40 MG PO TABS
40.0000 mg | ORAL_TABLET | Freq: Every day | ORAL | Status: DC
  Administered 2023-12-03 – 2023-12-04 (×2): 40 mg via ORAL
  Filled 2023-12-02 (×2): qty 1

## 2023-12-02 MED ORDER — FLUTICASONE FUROATE-VILANTEROL 100-25 MCG/ACT IN AEPB
1.0000 | INHALATION_SPRAY | Freq: Every day | RESPIRATORY_TRACT | Status: DC
  Administered 2023-12-03 – 2023-12-04 (×2): 1 via RESPIRATORY_TRACT
  Filled 2023-12-02: qty 28

## 2023-12-02 MED ORDER — HYDRALAZINE HCL 20 MG/ML IJ SOLN: 10.0000 mg | INTRAMUSCULAR | Status: DC | PRN

## 2023-12-02 MED ORDER — MELATONIN 3 MG PO TABS
3.0000 mg | ORAL_TABLET | Freq: Every evening | ORAL | Status: DC | PRN
  Administered 2023-12-03: 3 mg via ORAL
  Filled 2023-12-02: qty 1

## 2023-12-02 MED ORDER — ALBUTEROL SULFATE (2.5 MG/3ML) 0.083% IN NEBU: 2.5000 mg | INHALATION_SOLUTION | RESPIRATORY_TRACT | Status: DC | PRN

## 2023-12-02 MED ORDER — UMECLIDINIUM BROMIDE 62.5 MCG/ACT IN AEPB
1.0000 | INHALATION_SPRAY | Freq: Every day | RESPIRATORY_TRACT | Status: DC
  Administered 2023-12-03 – 2023-12-04 (×2): 1 via RESPIRATORY_TRACT
  Filled 2023-12-02: qty 7

## 2023-12-02 MED ORDER — ONDANSETRON HCL 4 MG/2ML IJ SOLN: 4.0000 mg | Freq: Four times a day (QID) | INTRAMUSCULAR | Status: DC | PRN

## 2023-12-02 MED ORDER — METOPROLOL TARTRATE 5 MG/5ML IV SOLN: 5.0000 mg | INTRAVENOUS | Status: DC | PRN

## 2023-12-02 MED ORDER — TAMSULOSIN HCL 0.4 MG PO CAPS
0.4000 mg | ORAL_CAPSULE | Freq: Two times a day (BID) | ORAL | Status: DC
  Filled 2023-12-02: qty 1

## 2023-12-02 MED ORDER — FINASTERIDE 5 MG PO TABS
5.0000 mg | ORAL_TABLET | Freq: Every day | ORAL | Status: DC
  Administered 2023-12-03 – 2023-12-04 (×2): 5 mg via ORAL
  Filled 2023-12-02 (×2): qty 1

## 2023-12-02 MED ORDER — ACETAMINOPHEN 325 MG PO TABS
650.0000 mg | ORAL_TABLET | Freq: Four times a day (QID) | ORAL | Status: DC | PRN
  Administered 2023-12-03: 650 mg via ORAL
  Filled 2023-12-02: qty 2

## 2023-12-02 MED ORDER — ACETAMINOPHEN 325 MG PO TABS: 650.0000 mg | ORAL_TABLET | ORAL | Status: DC | PRN

## 2023-12-02 MED ORDER — TAMSULOSIN HCL 0.4 MG PO CAPS
0.4000 mg | ORAL_CAPSULE | Freq: Every day | ORAL | Status: DC
  Administered 2023-12-03 – 2023-12-04 (×2): 0.4 mg via ORAL
  Filled 2023-12-02 (×2): qty 1

## 2023-12-02 MED ORDER — INSULIN ASPART 100 UNIT/ML IJ SOLN: 0.0000 [IU] | INTRAMUSCULAR | Status: DC

## 2023-12-02 MED ORDER — HYDRALAZINE HCL 20 MG/ML IJ SOLN
10.0000 mg | Freq: Once | INTRAMUSCULAR | Status: AC
  Administered 2023-12-02: 10 mg via INTRAVENOUS
  Filled 2023-12-02: qty 1

## 2023-12-02 NOTE — ED Notes (Signed)
Pt arrived from [facility] with [facility].

## 2023-12-02 NOTE — ED Notes (Signed)
Pt. Wife to nurses station stating pt. Is c/o of pain to had that is really bad and feeling like someone is hitting him in head. Dr. Wallace Cullens aware and orders given.

## 2023-12-02 NOTE — H&P (Incomplete)
History and Physical      Brent Snow NFA:[id number] DOB: 07/06/1941 DOA: 12/02/2023; DOS: 12/02/2023  PCP: Brent Ip, DO *** Patient coming from: home ***  I have personally briefly reviewed patient's old medical records in [facility]  Chief Complaint: ***  HPI: Brent [facility] is a 82 y.o. male with medical history significant for *** who is admitted to [facility] on 12/02/2023 with *** after presenting from home*** to [facility] ED complaining of ***.    ***       ***   ED Course:  Vital signs in the ED were notable for the following: ***  Labs were notable for the following: ***  Per my interpretation, EKG in ED demonstrated the following:  ***  Imaging in the ED, per corresponding formal radiology read, was notable for the following:  ***  EDP at [facility] d/w on-call neurosurgery, [facility], who recommended repeat CT head in 6 hours, which showed no progression in patient's acute subarachnoid hemorrhage/acute subdural hematoma. [facility] recommended [facility] admission for observation, conveying no indication for surgical intervention, and recommended outpatient follow-up in neurosurgery clinic. [facility] will not formally consult at this time, but conveyed that neurosurgery is available, as needed, if additional consultation is warranted.  Additionally, [facility] EDP d/w case/imaging with on-call, surgery, who also recommended [facility] admission, and conveyed that trauma surgery will consult.   While in the ED, the following were administered: ***  Subsequently, the patient was admitted  ***  ***red    Review of Systems: As per HPI otherwise 10 point review of systems negative.   Past Medical History:  Diagnosis Date   Arthritis    COPD, severe (HCC)    pulmologist-  dr byrum   Dyspnea on exertion    prescribed to use O2 via [facility]--- per pt he does not use O2 he just take 1-2 puffs of rescue inhaler   Hiatal hernia    History of acute  respiratory failure    02-18-2007  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   Hypertension    Hypoxemic respiratory failure, chronic (HCC)    pulmologist-  dr byrum-- prescribed supplemental O2 @ 2L via [facility]    OSA on CPAP    and added O2 @ 2L via  w/ cpap   Phimosis    S/P balloon dilatation of esophageal stricture    multiple    Type 2 diabetes mellitus (HCC)    followed by pcp--  per note uncontrolled w/ last A1c >14 on 12-30-2016   Wears contact lenses     Past Surgical History:  Procedure Laterality Date   BACK SURGERY     CIRCUMCISION N/A 01/27/2017   Procedure: CIRCUMCISION ADULT;  Surgeon: Brent Matar, MD;  Location: [facility];  Service: Urology;  Laterality: N/A;   KNEE ARTHROSCOPY Right 2008   LUMBAR SPINE SURGERY  1977  approx.   TONSILLECTOMY AND ADENOIDECTOMY  age 64   TRANSTHORACIC ECHOCARDIOGRAM  09/12/2015   mild LVH,  ef 65-70%,  grade 1 diastolic dysfunction/  mild RAE    Social History:  reports that he quit smoking about 17 years ago. His smoking use included cigarettes. He started smoking about 57 years ago. He has a 80 pack-year smoking history. He has never used smokeless tobacco. He reports current alcohol use. He reports that he does not use drugs.   No Known Allergies  Family History  Problem Relation Age of Onset  Asthma Mother    COPD Mother    Cancer Maternal Grandmother        stomach   Early death Father 36       [facility]    Family history reviewed and not pertinent ***   Prior to Admission medications   Medication Sig Start Date End Date Taking? Authorizing Provider  Albuterol Sulfate (PROAIR RESPICLICK) 108 (90 Base) MCG/ACT AEPB Inhale 2 puffs into the lungs every 6 (six) hours as needed. 05/17/23  Yes Gottschalk, Kathie Rhodes M, DO  finasteride (PROSCAR) 5 MG tablet Take 1 tablet (5 mg total) by mouth daily. 05/17/23  Yes Gottschalk, Kathie Rhodes M, DO  Fluticasone-Umeclidin-Vilant (TRELEGY ELLIPTA) 100-62.5-25  MCG/ACT AEPB USE 1 INHALATION ORALLY    DAILY 05/20/23  Yes Gottschalk, Ashly M, DO  lisinopril (ZESTRIL) 10 MG tablet Take 1 tablet (10 mg total) by mouth daily. 05/17/23  Yes Gottschalk, Kathie Rhodes M, DO  pravastatin (PRAVACHOL) 40 MG tablet Take 1 tablet (40 mg total) by mouth daily. 05/17/23  Yes Gottschalk, Kathie Rhodes M, DO  Semaglutide,0.25 or 0.5MG /DOS, (OZEMPIC, 0.25 OR 0.5 MG/DOSE,) 2 MG/3ML SOPN Inject 0.5 mg into the skin every 7 (seven) days. 05/17/23  Yes Brent Flavin M, DO  tamsulosin (FLOMAX) 0.4 MG CAPS capsule Take 1 capsule (0.4 mg total) by mouth 2 (two) times daily. Patient taking differently: Take 0.4 mg by mouth daily. 05/17/23  Yes Brent Ip, DO     Objective    Physical Exam: Vitals:   12/02/23 2045 12/02/23 2115 12/02/23 2150 12/02/23 2230  BP: (!) 158/79 (!) 171/93 (!) 142/74 (!) 140/76  Pulse: (!) 57  66 71  Resp: (!) 21 (!) 26 (!) 24 (!) 27  Temp:   98.8 F (37.1 C)   TempSrc:   Oral   SpO2: 97%  99% 100%  Weight:      Height:        General: appears to be stated age; alert, oriented Skin: warm, dry, no rash Head:  AT/[place] Mouth:  Oral mucosa membranes appear moist, normal dentition Neck: supple; trachea midline Heart:  RRR; did not appreciate any Snow/R/G Lungs: CTAB, did not appreciate any wheezes, rales, or rhonchi Abdomen: + BS; soft, ND, NT Vascular: 2+ pedal pulses b/l; 2+ radial pulses b/l Extremities: no peripheral edema, no muscle wasting Neuro: strength and sensation intact in upper and lower extremities b/l ***   *** Neuro: 5/5 strength of the proximal and distal flexors and extensors of the upper and lower extremities bilaterally; sensation intact in upper and lower extremities b/l; cranial nerves II through XII grossly intact; no pronator drift; no evidence suggestive of slurred speech, dysarthria, or facial droop; Normal muscle tone. No tremors.  *** Neuro: In the setting of the patient's current mental status and associated inability  to follow instructions, unable to perform full neurologic exam at this time.  As such, assessment of strength, sensation, and cranial nerves is limited at this time. Patient noted to spontaneously move all 4 extremities. No tremors.  ***    Labs on Admission: I have personally reviewed following labs and imaging studies  CBC: Recent Labs  Lab 12/02/23 1920  WBC 11.0*  NEUTROABS 9.5*  HGB 14.9  HCT 45.6  MCV 94.8  PLT 173   Basic Metabolic Panel: Recent Labs  Lab 12/02/23 1920  NA 136  K 3.7  CL 102  CO2 28  GLUCOSE 204*  BUN 16  CREATININE 0.84  CALCIUM 8.7*   GFR: Estimated Creatinine Clearance:  69.1 mL/min (by C-G formula based on SCr of 0.84 mg/dL). Liver Function Tests: No results for input(s): "AST", "ALT", "ALKPHOS", "BILITOT", "PROT", "ALBUMIN" in the last 168 hours. No results for input(s): "LIPASE", "AMYLASE" in the last 168 hours. No results for input(s): "AMMONIA" in the last 168 hours. Coagulation Profile: Recent Labs  Lab 12/02/23 1920  INR 1.0   Cardiac Enzymes: Recent Labs  Lab 12/02/23 1920  CKTOTAL 54   BNP (last 3 results) No results for input(s): "PROBNP" in the last 8760 hours. HbA1C: No results for input(s): "HGBA1C" in the last 72 hours. CBG: Recent Labs  Lab 12/02/23 2238  GLUCAP 116*   Lipid Profile: No results for input(s): "CHOL", "HDL", "LDLCALC", "TRIG", "CHOLHDL", "LDLDIRECT" in the last 72 hours. Thyroid Function Tests: No results for input(s): "TSH", "T4TOTAL", "FREET4", "T3FREE", "THYROIDAB" in the last 72 hours. Anemia Panel: No results for input(s): "VITAMINB12", "FOLATE", "FERRITIN", "TIBC", "IRON", "RETICCTPCT" in the last 72 hours. Urine analysis:    Component Value Date/Time   APPEARANCEUR Clear 03/29/2023 1014   GLUCOSEU Negative 03/29/2023 1014   BILIRUBINUR Negative 03/29/2023 1014   KETONESUR negative 01/14/2021 2001   PROTEINUR 1+ (A) 03/29/2023 1014   UROBILINOGEN 0.2 01/14/2021 2001   NITRITE  Negative 03/29/2023 1014   LEUKOCYTESUR 1+ (A) 03/29/2023 1014    Radiological Exams on Admission: CT Head Wo Contrast Result Date: 12/02/2023 CLINICAL DATA:  Follow-up subarachnoid hemorrhage EXAM: CT HEAD WITHOUT CONTRAST TECHNIQUE: Contiguous axial images were obtained from the base of the skull through the vertex without intravenous contrast. RADIATION DOSE REDUCTION: This exam was performed according to the departmental dose-optimization program which includes automated exposure control, adjustment of the mA and/or kV according to patient size and/or use of iterative reconstruction technique. COMPARISON:  CT from earlier in the same day. FINDINGS: Brain: Persistent subarachnoid hemorrhage in the right temporal lobe is noted. The overall appearance is similar to that seen on the prior exam. The left-sided subdural hematoma is stable in appearance as well unchanged from the prior exam. No new focal hemorrhage is seen. No acute infarct is noted. Vascular: No hyperdense vessel or unexpected calcification. Skull: The left parietal bone fracture is again noted but less apparent on this exam. Sinuses/Orbits: No acute finding. Other: Scalp hematoma is noted posteriorly on the left stable in appearance from the prior exam. IMPRESSION: Overall stable appearance of the head when compared with the earlier study. Subarachnoid hemorrhage is again noted in the right temporal lobe with contrecoup injury identified on the left with left parietal bone fracture. No new focal abnormality is noted. Electronically Signed   By: Alcide Clever Snow.D.   On: 12/02/2023 21:20   DG Elbow Complete Left Result Date: 12/02/2023 CLINICAL DATA:  Trip and fall on stairs with elbow laceration, initial encounter EXAM: LEFT ELBOW - COMPLETE 3+ VIEW COMPARISON:  None Available. FINDINGS: There is no evidence of fracture, dislocation, or joint effusion. There is no evidence of arthropathy or other focal bone abnormality. Soft tissues are  unremarkable. IMPRESSION: No acute abnormality noted. Electronically Signed   By: Alcide Clever Snow.D.   On: 12/02/2023 18:41   CT Head Wo Contrast Result Date: 12/02/2023 CLINICAL DATA:  Head trauma, moderate-severe; Neck trauma (Age >= 65y) EXAM: CT HEAD WITHOUT CONTRAST CT CERVICAL SPINE WITHOUT CONTRAST TECHNIQUE: Multidetector CT imaging of the head and cervical spine was performed following the standard protocol without intravenous contrast. Multiplanar CT image reconstructions of the cervical spine were also generated. RADIATION DOSE REDUCTION: This exam  was performed according to the departmental dose-optimization program which includes automated exposure control, adjustment of the mA and/or kV according to patient size and/or use of iterative reconstruction technique. COMPARISON:  None Available. FINDINGS: CT HEAD FINDINGS Brain: There is subarachnoid hemorrhage along the right middle cranial fossa and right temporal lobe. There is a 2 mm subdural hematoma along the left cerebral convexity (series 4, image 44). No evidence of intraventricular extension. No mass effect. No mass lesion. No CT evidence of an acute cortical infarct. Vascular: No hyperdense vessel or unexpected calcification. Skull: Left parietal scalp soft tissue hematoma. There is a nondisplaced fracture through left parietal bone (series 4, image 40). Sinuses/Orbits: No middle ear or mastoid effusion. Air-fluid level in the left maxillary sinus, which can be seen in the setting of acute sinusitis. Bilateral lens replacement. Orbits are otherwise unremarkable. Other: None. CT CERVICAL SPINE FINDINGS Alignment: Trace retrolisthesis of C4 on C5. Skull base and vertebrae: No acute fracture. No primary bone lesion or focal pathologic process. Soft tissues and spinal canal: No prevertebral fluid or swelling. No visible canal hematoma. Disc levels:  No CT evidence of high-grade spinal canal stenosis Upper chest: Negative. Other: None IMPRESSION: 1.  Subarachnoid hemorrhage along the right middle cranial fossa and right temporal lobe. 2. 2 mm subdural hematoma along the left cerebral convexity. 3. Nondisplaced fracture through the left parietal bone with overlying scalp soft tissue hematoma. 4. No acute cervical spine fracture. Discussed with Dr. Wallace Cullens on 12/02/23 at 6:29 PM. Electronically Signed   By: Lorenza Cambridge Snow.D.   On: 12/02/2023 18:29   CT Cervical Spine Wo Contrast Result Date: 12/02/2023 CLINICAL DATA:  Head trauma, moderate-severe; Neck trauma (Age >= 65y) EXAM: CT HEAD WITHOUT CONTRAST CT CERVICAL SPINE WITHOUT CONTRAST TECHNIQUE: Multidetector CT imaging of the head and cervical spine was performed following the standard protocol without intravenous contrast. Multiplanar CT image reconstructions of the cervical spine were also generated. RADIATION DOSE REDUCTION: This exam was performed according to the departmental dose-optimization program which includes automated exposure control, adjustment of the mA and/or kV according to patient size and/or use of iterative reconstruction technique. COMPARISON:  None Available. FINDINGS: CT HEAD FINDINGS Brain: There is subarachnoid hemorrhage along the right middle cranial fossa and right temporal lobe. There is a 2 mm subdural hematoma along the left cerebral convexity (series 4, image 44). No evidence of intraventricular extension. No mass effect. No mass lesion. No CT evidence of an acute cortical infarct. Vascular: No hyperdense vessel or unexpected calcification. Skull: Left parietal scalp soft tissue hematoma. There is a nondisplaced fracture through left parietal bone (series 4, image 40). Sinuses/Orbits: No middle ear or mastoid effusion. Air-fluid level in the left maxillary sinus, which can be seen in the setting of acute sinusitis. Bilateral lens replacement. Orbits are otherwise unremarkable. Other: None. CT CERVICAL SPINE FINDINGS Alignment: Trace retrolisthesis of C4 on C5. Skull base and  vertebrae: No acute fracture. No primary bone lesion or focal pathologic process. Soft tissues and spinal canal: No prevertebral fluid or swelling. No visible canal hematoma. Disc levels:  No CT evidence of high-grade spinal canal stenosis Upper chest: Negative. Other: None IMPRESSION: 1. Subarachnoid hemorrhage along the right middle cranial fossa and right temporal lobe. 2. 2 mm subdural hematoma along the left cerebral convexity. 3. Nondisplaced fracture through the left parietal bone with overlying scalp soft tissue hematoma. 4. No acute cervical spine fracture. Discussed with Dr. Wallace Cullens on 12/02/23 at 6:29 PM. Electronically Signed   By: Sandria Senter  Celine Mans Snow.D.   On: 12/02/2023 18:29      Assessment/Plan   Principal Problem:   SDH (subdural hematoma) (HCC)   ***            ***                ***                 ***               ***               ***               ***                ***               ***               ***               ***               ***              ***          ***  DVT prophylaxis: SCD's ***  Code Status: Full code*** Family Communication: none*** Disposition Plan: Per Rounding Team Consults called: none***;  Admission status: ***     I SPENT GREATER THAN 75 *** MINUTES IN CLINICAL CARE TIME/MEDICAL DECISION-MAKING IN COMPLETING THIS ADMISSION.      Chaney Born Suha Schoenbeck DO [facility]  From 7PM - 7AM   12/02/2023, 11:10 PM   ***

## 2023-12-02 NOTE — ED Provider Notes (Signed)
Patient transferred to [facility] for admission for subdural hemorrhage.  No noted deficits on my exam.  Patient being admitted to hospitalist service.   Anders Simmonds T, DO 12/02/23 2212

## 2023-12-02 NOTE — ED Provider Notes (Incomplete)
[place] EMERGENCY DEPARTMENT AT [facility] Provider Note   CSN: [id number] Arrival date & time: 12/02/23  1719     History {Add pertinent medical, surgical, social history, OB history to HPI:1} Chief Complaint  Patient presents with   Head Injury    Brent Snow is a 82 y.o. male.  Patient is a 82 yo male with past medical history of OSA, as needed oxygen use at home, COPD, and hypertension presenting for blunt head trauma after mechanical fall. Pt states he fell backward while walking down the stairs hitting the back of his head. Head hit cement basement floor. Bleeding from posterior scalp. Unknown loc. Unwitnessed fall. Got up himself and called family. Some nausea and vomiting after 1-2 episodes.   The history is provided by the patient. No language interpreter was used.  Head Injury Associated symptoms: no seizures and no vomiting        Home Medications Prior to Admission medications   Medication Sig Start Date End Date Taking? Authorizing Provider  Albuterol Sulfate (PROAIR RESPICLICK) 108 (90 Base) MCG/ACT AEPB Inhale 2 puffs into the lungs every 6 (six) hours as needed. 05/17/23   Raliegh Ip, DO  finasteride (PROSCAR) 5 MG tablet Take 1 tablet (5 mg total) by mouth daily. 05/17/23   Delynn Flavin M, DO  Fluticasone-Umeclidin-Vilant (TRELEGY ELLIPTA) 100-62.5-25 MCG/ACT AEPB USE 1 INHALATION ORALLY    DAILY 05/20/23   Delynn Flavin M, DO  lisinopril (ZESTRIL) 10 MG tablet Take 1 tablet (10 mg total) by mouth daily. 05/17/23   Raliegh Ip, DO  metFORMIN (GLUCOPHAGE) 500 MG tablet Take 2 tablets (1,000 mg total) by mouth daily with breakfast. 05/17/23   Delynn Flavin M, DO  pravastatin (PRAVACHOL) 40 MG tablet Take 1 tablet (40 mg total) by mouth daily. 05/17/23   Raliegh Ip, DO  Semaglutide,0.25 or 0.5MG /DOS, (OZEMPIC, 0.25 OR 0.5 MG/DOSE,) 2 MG/3ML SOPN Inject 0.5 mg into the skin every 7 (seven) days. 05/17/23    Raliegh Ip, DO  tamsulosin (FLOMAX) 0.4 MG CAPS capsule Take 1 capsule (0.4 mg total) by mouth 2 (two) times daily. 05/17/23   Raliegh Ip, DO      Allergies    Patient has no known allergies.    Review of Systems   Review of Systems  Constitutional:  Negative for chills and fever.  HENT:  Negative for ear pain and sore throat.   Eyes:  Negative for pain and visual disturbance.  Respiratory:  Negative for cough and shortness of breath.   Cardiovascular:  Negative for chest pain and palpitations.  Gastrointestinal:  Negative for abdominal pain and vomiting.  Genitourinary:  Negative for dysuria and hematuria.  Musculoskeletal:  Negative for arthralgias and back pain.  Skin:  Positive for wound. Negative for color change and rash.  Neurological:  Negative for seizures and syncope.  All other systems reviewed and are negative.   Physical Exam Updated Vital Signs BP (!) 169/80 (BP Location: Right Arm)   Pulse 79   Temp 97.8 F (36.6 C)   Resp 18   Ht 5\' 5"  (1.651 m)   Wt 82.1 kg   SpO2 94%   BMI 30.12 kg/m  Physical Exam Vitals and nursing note reviewed.  Constitutional:      General: He is not in acute distress.    Appearance: He is well-developed.  HENT:     Head: Normocephalic and atraumatic.  Eyes:     Conjunctiva/sclera: Conjunctivae normal.  Cardiovascular:  Rate and Rhythm: Normal rate and regular rhythm.     Heart sounds: No murmur heard. Pulmonary:     Effort: Pulmonary effort is normal. No respiratory distress.     Breath sounds: Normal breath sounds.  Abdominal:     Palpations: Abdomen is soft.     Tenderness: There is no abdominal tenderness.  Musculoskeletal:        General: No swelling.     Cervical back: Neck supple.  Skin:    General: Skin is warm and dry.     Capillary Refill: Capillary refill takes less than 2 seconds.  Neurological:     Mental Status: He is alert.  Psychiatric:        Mood and Affect: Mood normal.      ED Results / Procedures / Treatments   Labs (all labs ordered are listed, but only abnormal results are displayed) Labs Reviewed  CBC WITH DIFFERENTIAL/PLATELET  BASIC METABOLIC PANEL  PROTIME-INR  CK    EKG None  Radiology DG Elbow Complete Left Result Date: 12/02/2023 CLINICAL DATA:  Trip and fall on stairs with elbow laceration, initial encounter EXAM: LEFT ELBOW - COMPLETE 3+ VIEW COMPARISON:  None Available. FINDINGS: There is no evidence of fracture, dislocation, or joint effusion. There is no evidence of arthropathy or other focal bone abnormality. Soft tissues are unremarkable. IMPRESSION: No acute abnormality noted. Electronically Signed   By: Alcide Clever M.D.   On: 12/02/2023 18:41   CT Head Wo Contrast Result Date: 12/02/2023 CLINICAL DATA:  Head trauma, moderate-severe; Neck trauma (Age >= 65y) EXAM: CT HEAD WITHOUT CONTRAST CT CERVICAL SPINE WITHOUT CONTRAST TECHNIQUE: Multidetector CT imaging of the head and cervical spine was performed following the standard protocol without intravenous contrast. Multiplanar CT image reconstructions of the cervical spine were also generated. RADIATION DOSE REDUCTION: This exam was performed according to the departmental dose-optimization program which includes automated exposure control, adjustment of the mA and/or kV according to patient size and/or use of iterative reconstruction technique. COMPARISON:  None Available. FINDINGS: CT HEAD FINDINGS Brain: There is subarachnoid hemorrhage along the right middle cranial fossa and right temporal lobe. There is a 2 mm subdural hematoma along the left cerebral convexity (series 4, image 44). No evidence of intraventricular extension. No mass effect. No mass lesion. No CT evidence of an acute cortical infarct. Vascular: No hyperdense vessel or unexpected calcification. Skull: Left parietal scalp soft tissue hematoma. There is a nondisplaced fracture through left parietal bone (series 4, image  40). Sinuses/Orbits: No middle ear or mastoid effusion. Air-fluid level in the left maxillary sinus, which can be seen in the setting of acute sinusitis. Bilateral lens replacement. Orbits are otherwise unremarkable. Other: None. CT CERVICAL SPINE FINDINGS Alignment: Trace retrolisthesis of C4 on C5. Skull base and vertebrae: No acute fracture. No primary bone lesion or focal pathologic process. Soft tissues and spinal canal: No prevertebral fluid or swelling. No visible canal hematoma. Disc levels:  No CT evidence of high-grade spinal canal stenosis Upper chest: Negative. Other: None IMPRESSION: 1. Subarachnoid hemorrhage along the right middle cranial fossa and right temporal lobe. 2. 2 mm subdural hematoma along the left cerebral convexity. 3. Nondisplaced fracture through the left parietal bone with overlying scalp soft tissue hematoma. 4. No acute cervical spine fracture. Discussed with Dr. Wallace Cullens on 12/02/23 at 6:29 PM. Electronically Signed   By: Lorenza Cambridge M.D.   On: 12/02/2023 18:29   CT Cervical Spine Wo Contrast Result Date: 12/02/2023 CLINICAL DATA:  Head  trauma, moderate-severe; Neck trauma (Age >= 65y) EXAM: CT HEAD WITHOUT CONTRAST CT CERVICAL SPINE WITHOUT CONTRAST TECHNIQUE: Multidetector CT imaging of the head and cervical spine was performed following the standard protocol without intravenous contrast. Multiplanar CT image reconstructions of the cervical spine were also generated. RADIATION DOSE REDUCTION: This exam was performed according to the departmental dose-optimization program which includes automated exposure control, adjustment of the mA and/or kV according to patient size and/or use of iterative reconstruction technique. COMPARISON:  None Available. FINDINGS: CT HEAD FINDINGS Brain: There is subarachnoid hemorrhage along the right middle cranial fossa and right temporal lobe. There is a 2 mm subdural hematoma along the left cerebral convexity (series 4, image 44). No evidence of  intraventricular extension. No mass effect. No mass lesion. No CT evidence of an acute cortical infarct. Vascular: No hyperdense vessel or unexpected calcification. Skull: Left parietal scalp soft tissue hematoma. There is a nondisplaced fracture through left parietal bone (series 4, image 40). Sinuses/Orbits: No middle ear or mastoid effusion. Air-fluid level in the left maxillary sinus, which can be seen in the setting of acute sinusitis. Bilateral lens replacement. Orbits are otherwise unremarkable. Other: None. CT CERVICAL SPINE FINDINGS Alignment: Trace retrolisthesis of C4 on C5. Skull base and vertebrae: No acute fracture. No primary bone lesion or focal pathologic process. Soft tissues and spinal canal: No prevertebral fluid or swelling. No visible canal hematoma. Disc levels:  No CT evidence of high-grade spinal canal stenosis Upper chest: Negative. Other: None IMPRESSION: 1. Subarachnoid hemorrhage along the right middle cranial fossa and right temporal lobe. 2. 2 mm subdural hematoma along the left cerebral convexity. 3. Nondisplaced fracture through the left parietal bone with overlying scalp soft tissue hematoma. 4. No acute cervical spine fracture. Discussed with Dr. Wallace Cullens on 12/02/23 at 6:29 PM. Electronically Signed   By: Lorenza Cambridge M.D.   On: 12/02/2023 18:29    Procedures .Critical Care  Performed by: Franne Forts, DO Authorized by: Franne Forts, DO   Critical care provider statement:    Critical care time (minutes):  75   Critical care was necessary to treat or prevent imminent or life-threatening deterioration of the following conditions:  Trauma   Critical care was time spent personally by me on the following activities:  Development of treatment plan with patient or surrogate, discussions with consultants, evaluation of patient's response to treatment, examination of patient, ordering and review of laboratory studies, ordering and review of radiographic studies, ordering and  performing treatments and interventions, pulse oximetry, re-evaluation of patient's condition and review of old charts   Care discussed with: admitting provider     Care discussed with comment:  Trauma and neurosurgery   {Document cardiac monitor, telemetry assessment procedure when appropriate:1}  Medications Ordered in ED Medications - No data to display  ED Course/ Medical Decision Making/ A&P   {   Click here for ABCD2, HEART and other calculatorsREFRESH Note before signing :1}                              Medical Decision Making Amount and/or Complexity of Data Reviewed Labs: ordered. Radiology: ordered.  Risk Decision regarding hospitalization.    82 yo male with past medical history of OSA, as needed oxygen use at home, COPD, and hypertension presenting for blunt head trauma after mechanical fall.  On exam patient is awake, alert, stable vital signs, neurovascularly intact.  He does have hypertension at  169/80 at this time.  Minimally bleeding hematoma to the left parietal scalp.  No spinal tenderness.  No other bony tenderness.  Small left-sided elbow abrasion.  CT head demonstrates subdural hematoma without shift, subarachnoid hemorrhage, and left temporal bone fracture.  Consult to neurosurgery.  Recommending transfer for admission and observation.  Patient's family agreeable to plan.  {Document critical care time when appropriate:1} {Document review of labs and clinical decision tools ie heart score, Chads2Vasc2 etc:1}  {Document your independent review of radiology images, and any outside records:1} {Document your discussion with family members, caretakers, and with consultants:1} {Document social determinants of health affecting pt's care:1} {Document your decision making why or why not admission, treatments were needed:1} Final Clinical Impression(s) / ED Diagnoses Final diagnoses:  SAH (subarachnoid hemorrhage) (HCC)  Closed fracture of temporal bone, initial  encounter (HCC)  Subdural hematoma (HCC)    Rx / DC Orders ED Discharge Orders     None

## 2023-12-02 NOTE — ED Triage Notes (Signed)
Tripped down last step of stairs. Struck head, laceration to back left skull. Bleeding stopped PTA. Disperse skin tears on arms. HX falls with brain bleed in past. No thinners. Possible LOC upon falling. Denies CP, SOB, dizziness.

## 2023-12-02 NOTE — H&P (Incomplete)
History and Physical      Brent Snow DOA: 12/02/2023; DOS: 12/02/2023  PCP: Raliegh Ip, DO  Patient coming from: home   I have personally briefly reviewed patient's old medical records in [facility]  Chief Complaint: Fall  HPI: Brent Snow is a 82 y.o. male with medical history significant for type 2 diabetes mellitus, severe COPD, chronic hypoxic respiratory failure on 2 L continuous nasal cannula, essential hypertension, subdural hematoma in July 2024, BPH, who is admitted to [facility] on 12/02/2023 by way of transfer from [facility] with acute subarachnoid hemorrhage and acute subdural hematoma after mechanical fall at home after presenting from home to [facility] ED complaining of fall.   The patient reports that he tripped while ambulating down his stairs into the basement at home resulting in a fall down the final 2 or 3 steps.  He conveys that this resulted in him hitting his head as well as his left elbow.  Denies any associated loss of consciousness.  He reports ensuing headache and some mild acute left elbow discomfort, but denies any associated acute focal weakness or any acute focal numbness, paresthesias, vertigo, acute change in vision, facial droop, dysarthria, nor any associated nausea/vomiting.  Denies any associated preceding, or ensuing chest pain, shortness of breath, palpitations, diaphoresis, dizziness, presyncope, or syncope.  Not on any blood thinners as an outpatient, including no baby aspirin.  Denies any acute neck pain, and aside from the left elbow discomfort, denies any additional acute arthralgias.  No recent subjective fever, chills, rigors, or generalized myalgias.  No recent dysuria or gross hematuria or any recent new cough.  The patient was hospitalized in July 2024 for acute subdural hematoma after mechanical fall at home, with his hospital course complicated by a seizure.    [facility] ED  Course:  Vital signs in the ED were notable for the following: Afebrile; heart rates in the range of 57-79; systolic blood pressures in the 160s initially, with ensuing decrease in systolic blood pressure into the 140s following doses of multiple antihypertensive medications, as further quantified below ; respiratory rate 15-23, oxygen saturation 97 to 100% on his baseline 2 L continuous nasal cannula.  Labs were notable for the following: CBG 116.  BNP 136, bicarbonate 28, anion gap 6, creatinine 0.84, glucose 2 4.  Total CPK 54.  CBC notable for open cell count 11,000, hemoglobin 14.9, platelet count 173.  INR 1.0.  Per my interpretation, EKG in ED demonstrated the following: No EKG performed in the ED today.  Imaging in the ED, per corresponding formal radiology read, was notable for the following: CT head, showed subarachnoid hemorrhage along the right middle cranial fossa and right temporal lobe, as well as 2 mm subdural hematoma along the left cerebral convexity, without any evidence of mass effect or midline shift.  CT head also showed nondisplaced fracture through the left parietal bone with overlying scalp soft tissue hematoma.  CT cervical spine showed no evidence of acute cervical spine fracture or subluxation injury.  Plain films of the left elbow showed no evidence of acute osseous abnormality, including no evidence of acute fracture or dislocation.  Repeat CT head performed at 2100 on 12/02/2023 showed stable appearing imaging, without any significant interval change relating to the patient's arachnoid hemorrhage subdural hematoma compared to CT head performed earlier today, will demonstrating no evidence of new focal abnormality.  EDP at [facility] d/w on-call neurosurgery, Dr. Elonda Husky, who recommended repeat CT  head in 6 hours, which showed no progression in patient's acute subarachnoid hemorrhage/acute subdural hematoma. Dr. Elonda Husky recommended [facility] admission for observation, conveying no  indication for surgical intervention, and recommended outpatient follow-up in neurosurgery clinic. Dr. Elonda Husky will not formally consult at this time, but conveyed that neurosurgery is available, as needed, if additional consultation is warranted. He also recommended goal systolic pressure less than 140 mmHg.  Additionally, [facility] EDP d/w case/imaging with on-call, surgery, who also recommended [facility] admission, and conveyed that trauma surgery will consult.   While in the ED, the following were administered: Hydralazine 10 mg IV x 1 dose, lisinopril 20 mg p.o. x 1, Lopressor 5 mg IV x 1 dose.  Subsequently, the patient was admitted to [facility] for further evaluation management of acute subarachnoid hemorrhage, acute subdural hematoma, acute left parietal bone fracture all stemming from mechanical fall at home, with presenting labs also notable for mild leukocytosis.  ***red    Review of Systems: As per HPI otherwise 10 point review of systems negative.   Past Medical History:  Diagnosis Date  . Arthritis   . COPD, severe (HCC)    pulmologist-  dr byrum  . Dyspnea on exertion    prescribed to use O2 via [place]--- per pt he does not use O2 he just take 1-2 puffs of rescue inhaler  . Hiatal hernia   . 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 Hypertension   . Hypoxemic respiratory failure, chronic (HCC)    pulmologist-  dr byrum-- prescribed supplemental O2 @ 2L via [place]   . OSA on CPAP    and added O2 @ 2L via [place] w/ cpap  . Phimosis   . S/P balloon dilatation of esophageal stricture    multiple   . Type 2 diabetes mellitus (HCC)    followed by pcp--  per note uncontrolled w/ last A1c >14 on 12-30-2016  . Wears contact lenses     Past Surgical History:  Procedure Laterality Date  . BACK SURGERY    . CIRCUMCISION N/A 01/27/2017   Procedure: CIRCUMCISION ADULT;  Surgeon: Marcine Matar, MD;  Location: [facility];   Service: Urology;  Laterality: N/A;  . KNEE ARTHROSCOPY Right 2008  . LUMBAR SPINE SURGERY  1977  approx.  . TONSILLECTOMY AND ADENOIDECTOMY  age 66  . TRANSTHORACIC ECHOCARDIOGRAM  09/12/2015   mild LVH,  ef 65-70%,  grade 1 diastolic dysfunction/  mild RAE    Social History:  reports that he quit smoking about 17 years ago. His smoking use included cigarettes. He started smoking about 57 years ago. He has a 80 pack-year smoking history. He has never used smokeless tobacco. He reports current alcohol use. He reports that he does not use drugs.   No Known Allergies  Family History  Problem Relation Age of Onset  . Asthma Mother   . COPD Mother   . Cancer Maternal Grandmother        stomach  . Early death Father 49       Tractor Accident    Family history reviewed and not pertinent    Prior to Admission medications   Medication Sig Start Date End Date Taking? Authorizing Provider  Albuterol Sulfate (PROAIR RESPICLICK) 108 (90 Base) MCG/ACT AEPB Inhale 2 puffs into the lungs every 6 (six) hours as needed. 05/17/23  Yes Gottschalk, Kathie Rhodes M, DO  finasteride (PROSCAR) 5 MG tablet Take  1 tablet (5 mg total) by mouth daily. 05/17/23  Yes Gottschalk, Kathie Rhodes M, DO  Fluticasone-Umeclidin-Vilant (TRELEGY ELLIPTA) 100-62.5-25 MCG/ACT AEPB USE 1 INHALATION ORALLY    DAILY 05/20/23  Yes Gottschalk, Ashly M, DO  lisinopril (ZESTRIL) 10 MG tablet Take 1 tablet (10 mg total) by mouth daily. 05/17/23  Yes Gottschalk, Kathie Rhodes M, DO  pravastatin (PRAVACHOL) 40 MG tablet Take 1 tablet (40 mg total) by mouth daily. 05/17/23  Yes Gottschalk, Kathie Rhodes M, DO  Semaglutide,0.25 or 0.5MG /DOS, (OZEMPIC, 0.25 OR 0.5 MG/DOSE,) 2 MG/3ML SOPN Inject 0.5 mg into the skin every 7 (seven) days. 05/17/23  Yes Delynn Flavin M, DO  tamsulosin (FLOMAX) 0.4 MG CAPS capsule Take 1 capsule (0.4 mg total) by mouth 2 (two) times daily. Patient taking differently: Take 0.4 mg by mouth daily. 05/17/23  Yes Raliegh Ip, DO      Objective    Physical Exam: Vitals:   12/02/23 2045 12/02/23 2115 12/02/23 2150 12/02/23 2230  BP: (!) 158/79 (!) 171/93 (!) 142/74 (!) 140/76  Pulse: (!) 57  66 71  Resp: (!) 21 (!) 26 (!) 24 (!) 27  Temp:   98.8 F (37.1 C)   TempSrc:   Oral   SpO2: 97%  99% 100%  Weight:      Height:        General: appears to be stated age; alert Skin: warm, dry, no rash Head: Mild ecchymosis over the left parietal region Mouth:  Oral mucosa membranes appear moist, normal dentition Neck: supple; trachea midline Heart:  RRR; did not appreciate any M/R/G Lungs: CTAB, did not appreciate any wheezes, rales, or rhonchi Abdomen: + BS; soft, ND, NT Vascular: 2+ pedal pulses b/l; 2+ radial pulses b/l Extremities: no peripheral edema, no muscle wasting Neuro: strength and sensation intact in upper and lower extremities b/l; no evidence suggestive of slurred speech, dysarthria, or facial droop; Normal muscle tone. No tremors.    Labs on Admission: I have personally reviewed following labs and imaging studies  CBC: Recent Labs  Lab 12/02/23 1920  WBC 11.0*  NEUTROABS 9.5*  HGB 14.9  HCT 45.6  MCV 94.8  PLT 173   Basic Metabolic Panel: Recent Labs  Lab 12/02/23 1920  NA 136  K 3.7  CL 102  CO2 28  GLUCOSE 204*  BUN 16  CREATININE 0.84  CALCIUM 8.7*   GFR: Estimated Creatinine Clearance: 69.1 mL/min (by C-G formula based on SCr of 0.84 mg/dL). Liver Function Tests: No results for input(s): "AST", "ALT", "ALKPHOS", "BILITOT", "PROT", "ALBUMIN" in the last 168 hours. No results for input(s): "LIPASE", "AMYLASE" in the last 168 hours. No results for input(s): "AMMONIA" in the last 168 hours. Coagulation Profile: Recent Labs  Lab 12/02/23 1920  INR 1.0   Cardiac Enzymes: Recent Labs  Lab 12/02/23 1920  CKTOTAL 54   BNP (last 3 results) No results for input(s): "PROBNP" in the last 8760 hours. HbA1C: No results for input(s): "HGBA1C" in the last 72  hours. CBG: Recent Labs  Lab 12/02/23 2238  GLUCAP 116*   Lipid Profile: No results for input(s): "CHOL", "HDL", "LDLCALC", "TRIG", "CHOLHDL", "LDLDIRECT" in the last 72 hours. Thyroid Function Tests: No results for input(s): "TSH", "T4TOTAL", "FREET4", "T3FREE", "THYROIDAB" in the last 72 hours. Anemia Panel: No results for input(s): "VITAMINB12", "FOLATE", "FERRITIN", "TIBC", "IRON", "RETICCTPCT" in the last 72 hours. Urine analysis:    Component Value Date/Time   APPEARANCEUR Clear 03/29/2023 1014   GLUCOSEU Negative 03/29/2023 1014   BILIRUBINUR Negative  03/29/2023 1014   KETONESUR negative 01/14/2021 2001   PROTEINUR 1+ (A) 03/29/2023 1014   UROBILINOGEN 0.2 01/14/2021 2001   NITRITE Negative 03/29/2023 1014   LEUKOCYTESUR 1+ (A) 03/29/2023 1014    Radiological Exams on Admission: CT Head Wo Contrast Result Date: 12/02/2023 CLINICAL DATA:  Follow-up subarachnoid hemorrhage EXAM: CT HEAD WITHOUT CONTRAST TECHNIQUE: Contiguous axial images were obtained from the base of the skull through the vertex without intravenous contrast. RADIATION DOSE REDUCTION: This exam was performed according to the departmental dose-optimization program which includes automated exposure control, adjustment of the mA and/or kV according to patient size and/or use of iterative reconstruction technique. COMPARISON:  CT from earlier in the same day. FINDINGS: Brain: Persistent subarachnoid hemorrhage in the right temporal lobe is noted. The overall appearance is similar to that seen on the prior exam. The left-sided subdural hematoma is stable in appearance as well unchanged from the prior exam. No new focal hemorrhage is seen. No acute infarct is noted. Vascular: No hyperdense vessel or unexpected calcification. Skull: The left parietal bone fracture is again noted but less apparent on this exam. Sinuses/Orbits: No acute finding. Other: Scalp hematoma is noted posteriorly on the left stable in appearance from  the prior exam. IMPRESSION: Overall stable appearance of the head when compared with the earlier study. Subarachnoid hemorrhage is again noted in the right temporal lobe with contrecoup injury identified on the left with left parietal bone fracture. No new focal abnormality is noted. Electronically Signed   By: Alcide Clever M.D.   On: 12/02/2023 21:20   DG Elbow Complete Left Result Date: 12/02/2023 CLINICAL DATA:  Trip and fall on stairs with elbow laceration, initial encounter EXAM: LEFT ELBOW - COMPLETE 3+ VIEW COMPARISON:  None Available. FINDINGS: There is no evidence of fracture, dislocation, or joint effusion. There is no evidence of arthropathy or other focal bone abnormality. Soft tissues are unremarkable. IMPRESSION: No acute abnormality noted. Electronically Signed   By: Alcide Clever M.D.   On: 12/02/2023 18:41   CT Head Wo Contrast Result Date: 12/02/2023 CLINICAL DATA:  Head trauma, moderate-severe; Neck trauma (Age >= 65y) EXAM: CT HEAD WITHOUT CONTRAST CT CERVICAL SPINE WITHOUT CONTRAST TECHNIQUE: Multidetector CT imaging of the head and cervical spine was performed following the standard protocol without intravenous contrast. Multiplanar CT image reconstructions of the cervical spine were also generated. RADIATION DOSE REDUCTION: This exam was performed according to the departmental dose-optimization program which includes automated exposure control, adjustment of the mA and/or kV according to patient size and/or use of iterative reconstruction technique. COMPARISON:  None Available. FINDINGS: CT HEAD FINDINGS Brain: There is subarachnoid hemorrhage along the right middle cranial fossa and right temporal lobe. There is a 2 mm subdural hematoma along the left cerebral convexity (series 4, image 44). No evidence of intraventricular extension. No mass effect. No mass lesion. No CT evidence of an acute cortical infarct. Vascular: No hyperdense vessel or unexpected calcification. Skull: Left  parietal scalp soft tissue hematoma. There is a nondisplaced fracture through left parietal bone (series 4, image 40). Sinuses/Orbits: No middle ear or mastoid effusion. Air-fluid level in the left maxillary sinus, which can be seen in the setting of acute sinusitis. Bilateral lens replacement. Orbits are otherwise unremarkable. Other: None. CT CERVICAL SPINE FINDINGS Alignment: Trace retrolisthesis of C4 on C5. Skull base and vertebrae: No acute fracture. No primary bone lesion or focal pathologic process. Soft tissues and spinal canal: No prevertebral fluid or swelling. No visible canal hematoma. Disc  levels:  No CT evidence of high-grade spinal canal stenosis Upper chest: Negative. Other: None IMPRESSION: 1. Subarachnoid hemorrhage along the right middle cranial fossa and right temporal lobe. 2. 2 mm subdural hematoma along the left cerebral convexity. 3. Nondisplaced fracture through the left parietal bone with overlying scalp soft tissue hematoma. 4. No acute cervical spine fracture. Discussed with Dr. Wallace Cullens on 12/02/23 at 6:29 PM. Electronically Signed   By: Lorenza Cambridge M.D.   On: 12/02/2023 18:29   CT Cervical Spine Wo Contrast Result Date: 12/02/2023 CLINICAL DATA:  Head trauma, moderate-severe; Neck trauma (Age >= 65y) EXAM: CT HEAD WITHOUT CONTRAST CT CERVICAL SPINE WITHOUT CONTRAST TECHNIQUE: Multidetector CT imaging of the head and cervical spine was performed following the standard protocol without intravenous contrast. Multiplanar CT image reconstructions of the cervical spine were also generated. RADIATION DOSE REDUCTION: This exam was performed according to the departmental dose-optimization program which includes automated exposure control, adjustment of the mA and/or kV according to patient size and/or use of iterative reconstruction technique. COMPARISON:  None Available. FINDINGS: CT HEAD FINDINGS Brain: There is subarachnoid hemorrhage along the right middle cranial fossa and right temporal  lobe. There is a 2 mm subdural hematoma along the left cerebral convexity (series 4, image 44). No evidence of intraventricular extension. No mass effect. No mass lesion. No CT evidence of an acute cortical infarct. Vascular: No hyperdense vessel or unexpected calcification. Skull: Left parietal scalp soft tissue hematoma. There is a nondisplaced fracture through left parietal bone (series 4, image 40). Sinuses/Orbits: No middle ear or mastoid effusion. Air-fluid level in the left maxillary sinus, which can be seen in the setting of acute sinusitis. Bilateral lens replacement. Orbits are otherwise unremarkable. Other: None. CT CERVICAL SPINE FINDINGS Alignment: Trace retrolisthesis of C4 on C5. Skull base and vertebrae: No acute fracture. No primary bone lesion or focal pathologic process. Soft tissues and spinal canal: No prevertebral fluid or swelling. No visible canal hematoma. Disc levels:  No CT evidence of high-grade spinal canal stenosis Upper chest: Negative. Other: None IMPRESSION: 1. Subarachnoid hemorrhage along the right middle cranial fossa and right temporal lobe. 2. 2 mm subdural hematoma along the left cerebral convexity. 3. Nondisplaced fracture through the left parietal bone with overlying scalp soft tissue hematoma. 4. No acute cervical spine fracture. Discussed with Dr. Wallace Cullens on 12/02/23 at 6:29 PM. Electronically Signed   By: Lorenza Cambridge M.D.   On: 12/02/2023 18:29      Assessment/Plan   Principal Problem:   SDH (subdural hematoma) (HCC)   ***            #) Acute subarachnoid hemorrhage/acute subdural hematoma: Stemming from mechanical fall at home which the patient did strike his head, in the absence of any blood thinners as an outpatient, CT head showed subarachnoid hemorrhage along the right middle cranial fossa and right temporal lobe as well as acute 2 mm subdural hematoma along the left cerebral convexity, without any corresponding evidence of mass effect or midline  shift.  No evidence of worsening bradycardia or worsening hypertension nor any evidence of irregular respirations or wide pulse pressure to suggest increasing intracranial pressure at this time.  Presentation not associate any acute focal neurologic deficits.  This is also a context of a documented history of acute subdural hematoma after fall in July 2024.  EDP d/w case/imaging with on-call neurosurgery, who had recommended repeat 6-hour CT head, which showed stable findings, without any acute worsening in either the subdural hematoma nor  subarachnoid hemorrhage.  Neurosurgery confirmed no indication for surgical intervention, recommending outpatient follow-up in neurosurgery clinic, goal systolic pressure less than 140, and conveyed that neurosurgery is not planning to formally consult, but is available , as needed, if additional consultation is warranted.  Plan: Monitor on telemetry..  IV hydralazine for systolic blood pressure greater than 140.  Add on PTT.  Repeat CBC in the morning.  Acute 4-hour neurochecks ordered.  Refraining from pharmacologic DVT prophylaxis.  SCDs.                   #) mechanical fall: it appears that the patient experienced a fall that appears to be purely mechanical in nature, with patient conveying that they tripped down the final 2-3 stairs into the basement, without any preceding or resultant loss of consciousness. Not a/w any presyncope or chest pain.  Hit his head as a component of this fall, with resultant acute subbed dural hematoma and acute subarachnoid hemorrhage, in the absence of any blood thinners.  Has some mild acute left elbow discomfort, with associated plain, showing no evidence of acute fracture, but denies any additional acute arthralgias or myalgias.  While this fall appears to be purely mechanical in nature, will evaluate for underlying factors that may have predisposed patient to experience such a fall, including further evaluation for  any any underlying infectious contribution.  Plan: Fall precautions. Repeat CMP and CBC with differential in the morning.  PT/OT consult ordered. Check UA.  Further evaluation management of presenting acute subarachnoid hemorrhage and acute subdural hematoma, as above.                #) Acute left parietal bone fracture: Stemming from today's mechanical fall in which she struck his head, CT head shows evidence of nondisplaced fracture through the left parietal bone.  EDP discussed patient's case with on-call trauma surgery, who recommended [facility] admission, and conveyed that they will consult, with additional recommendations pending at this time.  Plan: Trauma surgery service today consult, as above.  Prn acetaminophen.  Fall precautions.Marland Kitchen                   #) Leukocytosis: Presenting CBC reflects mildly elevated white cell count of 11,000. Suspect an reactive element in the setting of presenting mechanical fall with resultant subarachnoid hemorrhage and acute subdural hematoma. no overt evidence to suggest underlying infectious process at this time.  However, in the setting of presenting for will, will also check urinalysis to concomitantly evaluate for any predisposing factors that may have led to his mechanical fall.  I No additional SIRS criteria met at this time. Overall, criteria for sepsis are not currently met. Appears hemodynamically stable.  Therefore, will refrain from initiation of antibiotics at this time.  Plan: Repeat CBC with diff in the morning.  Monitor strict I's and O's, daily weights.  Check urinalysis, as above.                     #) Type 2 Diabetes Mellitus: documented history of such. Home insulin regimen:  ***. Home oral hypoglycemic agents:  ***. presenting blood sugar:  ***. Most recent A1c noted to be  ***.   Plan: accuchecks QAC and HS with low dose SSI. ***    hold home oral hypoglycemic agents during this  hospitalization. ***    *** complicated by?  *** in terms of initial dose of basal insulin to be started during this hospitalization, will resume approximately half  of outpatient dose in order to reduce risk for ensuing hypoglycemia                #) Chronic hypoxic respiratory failure in the setting of severe COPD: Documented history thereof, without clinical evidence of acute exacerbation at this time, will also noting chronic supplemental oxygen requirements in the form of continuous 2 L nasal cannula, with presenting oxygen saturations in the high 90s to 100% on this baseline 2 L nasal cannula.  This is all in the context of being a former smoker. Outpatient respiratory regimen includes the following: Trelegy as well as as needed albuterol inhaler.   Plan: cont outpatient Trelegy. Prn albuterol nebulizer. Check CMP and serum magnesium level in the AM.                    #) Essential Hypertension: documented h/o such, with outpatient antihypertensive regimen including lisinopril.  Is also noted to be on Flomax as an outpatient setting of BPH.  SBP's in the ED today: 140s to 160s mmHg. as described above, in the setting of presenting subarachnoid hemorrhage and subdural hematoma, neurosurgery is recommended will systolic blood pressure less than 140.  The setting of heart rates in the 50s to 70s, as well as increased risk for ensuing bradycardia as a consequence of his intracranial hemorrhages, we will proceed with prn IV hydralazine to strive for the aforementioned goal systolic blood pressure.  Plan: Close monitoring of subsequent BP via routine VS.  ***                   #) Benign Prostatic Hyperplasia:  documented h/o such; on tamsulosin*** as outpatient.   *** in setting of presenting sepsis, will hold tamsulosin for now due to the potential for peripheral vasodilatory consequences and increased risk for hypotension.    Plan: monitor strict  I's & O's and daily weights. Repeat CMP in AM.   *** continue home tamsulosin.  *** continue home finasteride.                 ***               ***               ***              ***          ***  DVT prophylaxis: SCD's ***  Code Status: Full code*** Family Communication: none*** Disposition Plan: Per Rounding Team Consults called: none***;  Admission status: ***     I SPENT GREATER THAN 75 *** MINUTES IN CLINICAL CARE TIME/MEDICAL DECISION-MAKING IN COMPLETING THIS ADMISSION.      Chaney Born Doriann Zuch DO [facility]  From 7PM - 7AM   12/02/2023, 11:10 PM   ***

## 2023-12-03 ENCOUNTER — Inpatient Hospital Stay (HOSPITAL_COMMUNITY): Payer: Medicare Other

## 2023-12-03 DIAGNOSIS — D72829 Elevated white blood cell count, unspecified: Secondary | ICD-10-CM | POA: Diagnosis present

## 2023-12-03 DIAGNOSIS — S065XAA Traumatic subdural hemorrhage with loss of consciousness status unknown, initial encounter: Secondary | ICD-10-CM | POA: Diagnosis not present

## 2023-12-03 DIAGNOSIS — I609 Nontraumatic subarachnoid hemorrhage, unspecified: Secondary | ICD-10-CM | POA: Diagnosis not present

## 2023-12-03 DIAGNOSIS — S0219XA Other fracture of base of skull, initial encounter for closed fracture: Secondary | ICD-10-CM | POA: Diagnosis not present

## 2023-12-03 DIAGNOSIS — S020XXA Fracture of vault of skull, initial encounter for closed fracture: Secondary | ICD-10-CM | POA: Diagnosis present

## 2023-12-03 DIAGNOSIS — W19XXXA Unspecified fall, initial encounter: Secondary | ICD-10-CM

## 2023-12-03 DIAGNOSIS — Y92009 Unspecified place in unspecified non-institutional (private) residence as the place of occurrence of the external cause: Secondary | ICD-10-CM

## 2023-12-03 HISTORY — DX: Fracture of vault of skull, initial encounter for closed fracture: S02.0XXA

## 2023-12-03 LAB — GLUCOSE, CAPILLARY: Glucose-Capillary: 230 mg/dL — ABNORMAL HIGH (ref 70–99)

## 2023-12-03 LAB — COMPREHENSIVE METABOLIC PANEL
ALT: 17 U/L (ref 0–44)
AST: 15 U/L (ref 15–41)
Albumin: 3.1 g/dL — ABNORMAL LOW (ref 3.5–5.0)
Alkaline Phosphatase: 67 U/L (ref 38–126)
Anion gap: 9 (ref 5–15)
BUN: 10 mg/dL (ref 8–23)
CO2: 24 mmol/L (ref 22–32)
Calcium: 8.9 mg/dL (ref 8.9–10.3)
Chloride: 105 mmol/L (ref 98–111)
Creatinine, Ser: 0.76 mg/dL (ref 0.61–1.24)
GFR, Estimated: >60 mL/min (ref >60)
Glucose, Bld: 146 mg/dL — ABNORMAL HIGH (ref 70–99)
Potassium: 3.9 mmol/L (ref 3.5–5.1)
Sodium: 138 mmol/L (ref 135–145)
Total Bilirubin: 0.4 mg/dL (ref <1.2)
Total Protein: 5.9 g/dL — ABNORMAL LOW (ref 6.5–8.1)

## 2023-12-03 LAB — CBC WITH DIFFERENTIAL/PLATELET
Abs Immature Granulocytes: 0.05 10*3/uL (ref 0.00–0.07)
Basophils Absolute: 0 10*3/uL (ref 0.0–0.1)
Basophils Relative: 0 %
Eosinophils Absolute: 0 10*3/uL (ref 0.0–0.5)
Eosinophils Relative: 0 %
HCT: 45.8 % (ref 39.0–52.0)
Hemoglobin: 14.8 g/dL (ref 13.0–17.0)
Immature Granulocytes: 1 %
Lymphocytes Relative: 10 %
Lymphs Abs: 1 10*3/uL (ref 0.7–4.0)
MCH: 31 pg (ref 26.0–34.0)
MCHC: 32.3 g/dL (ref 30.0–36.0)
MCV: 95.8 fL (ref 80.0–100.0)
Monocytes Absolute: 0.8 10*3/uL (ref 0.1–1.0)
Monocytes Relative: 8 %
Neutro Abs: 8.3 10*3/uL — ABNORMAL HIGH (ref 1.7–7.7)
Neutrophils Relative %: 81 %
Platelets: 171 10*3/uL (ref 150–400)
RBC: 4.78 MIL/uL (ref 4.22–5.81)
RDW: 12.8 % (ref 11.5–15.5)
WBC: 10.1 10*3/uL (ref 4.0–10.5)
nRBC: 0 % (ref 0.0–0.2)

## 2023-12-03 LAB — CBG MONITORING, ED
Glucose-Capillary: 138 mg/dL — ABNORMAL HIGH (ref 70–99)
Glucose-Capillary: 124 mg/dL — ABNORMAL HIGH (ref 70–99)
Glucose-Capillary: 123 mg/dL — ABNORMAL HIGH (ref 70–99)
Glucose-Capillary: 120 mg/dL — ABNORMAL HIGH (ref 70–99)

## 2023-12-03 LAB — MAGNESIUM: Magnesium: 1.8 mg/dL (ref 1.7–2.4)

## 2023-12-03 MED ORDER — OXYCODONE HCL 5 MG PO TABS: 2.5000 mg | ORAL_TABLET | ORAL | Status: DC | PRN

## 2023-12-03 MED ORDER — ACETAMINOPHEN 500 MG PO TABS: 1000.0000 mg | ORAL_TABLET | Freq: Four times a day (QID) | ORAL | Status: DC | PRN

## 2023-12-03 MED ORDER — ACETAMINOPHEN 650 MG RE SUPP: 650.0000 mg | Freq: Four times a day (QID) | RECTAL | Status: DC | PRN

## 2023-12-03 MED ORDER — ACETAMINOPHEN 500 MG PO TABS
1000.0000 mg | ORAL_TABLET | Freq: Four times a day (QID) | ORAL | Status: DC
  Administered 2023-12-03 – 2023-12-04 (×4): 1000 mg via ORAL
  Filled 2023-12-03 (×4): qty 2

## 2023-12-03 MED ORDER — NALOXONE HCL 0.4 MG/ML IJ SOLN: 0.4000 mg | INTRAMUSCULAR | Status: DC | PRN

## 2023-12-03 MED ORDER — FENTANYL CITRATE PF 50 MCG/ML IJ SOSY: 25.0000 ug | PREFILLED_SYRINGE | INTRAMUSCULAR | Status: DC | PRN

## 2023-12-03 MED ORDER — METHOCARBAMOL 500 MG PO TABS: 500.0000 mg | ORAL_TABLET | Freq: Four times a day (QID) | ORAL | Status: DC | PRN

## 2023-12-03 MED ORDER — LEVETIRACETAM 500 MG PO TABS
500.0000 mg | ORAL_TABLET | Freq: Two times a day (BID) | ORAL | Status: DC
  Administered 2023-12-03 – 2023-12-04 (×3): 500 mg via ORAL
  Filled 2023-12-03 (×3): qty 1

## 2023-12-03 MED ORDER — MORPHINE SULFATE (PF) 2 MG/ML IV SOLN: 2.0000 mg | INTRAVENOUS | Status: DC | PRN

## 2023-12-03 MED ORDER — INSULIN ASPART 100 UNIT/ML IJ SOLN: 0.0000 [IU] | Freq: Three times a day (TID) | INTRAMUSCULAR | Status: DC

## 2023-12-03 NOTE — Progress Notes (Signed)
   12/03/23 2014  BiPAP/CPAP/SIPAP  Reason BIPAP/CPAP not in use Non-compliant (Pt states he doesn't wear a CPAP anymore.)  BiPAP/CPAP /SiPAP Vitals  Pulse Rate 68  Resp 20  MEWS Score/Color  MEWS Score 0  MEWS Score Color Green

## 2023-12-03 NOTE — Hospital Course (Signed)
82 y.o. male with medical history significant for type 2 diabetes mellitus, severe COPD, chronic hypoxic respiratory failure on 2 L continuous nasal cannula, essential hypertension, subdural hematoma in July 2024, BPH, who is admitted to [facility] on 12/02/2023 by way of transfer from [facility] with acute subarachnoid hemorrhage and acute subdural hematoma after mechanical fall at home after presenting from home to [facility] ED complaining of fall. CT head, showed subarachnoid hemorrhage along the right middle cranial fossa and right temporal lobe, as well as 2 mm subdural hematoma along the left cerebral convexity, without any evidence of mass effect or midline shift. CT head also showed nondisplaced fracture through the left parietal bone with overlying scalp soft tissue hematoma. EDP at [facility] d/w on-call neurosurgery, Dr. Elonda Husky, who recommended repeat CT head in 6 hours, which showed no progression in patient's acute subarachnoid hemorrhage/acute subdural hematoma. Dr. Elonda Husky recommended [facility] admission for observation, conveying no indication for surgical intervention, and recommended outpatient follow-up in neurosurgery clinic. Trauma surgery consulted

## 2023-12-03 NOTE — Progress Notes (Signed)
Progress Note   Patient: Brent Snow XLK:[id number] DOB: [birth year] DOA: 12/02/2023     1 DOS: the patient was seen and examined on 12/03/2023   Brief hospital course: 82 y.o. male with medical history significant for type 2 diabetes mellitus, severe COPD, chronic hypoxic respiratory failure on 2 L continuous nasal cannula, essential hypertension, subdural hematoma in July 2024, BPH, who is admitted to [facility] on 12/02/2023 by way of transfer from [facility] with acute subarachnoid hemorrhage and acute subdural hematoma after mechanical fall at home after presenting from home to [facility] ED complaining of fall. CT head, showed subarachnoid hemorrhage along the right middle cranial fossa and right temporal lobe, as well as 2 mm subdural hematoma along the left cerebral convexity, without any evidence of mass effect or midline shift. CT head also showed nondisplaced fracture through the left parietal bone with overlying scalp soft tissue hematoma. EDP at [facility] d/w on-call neurosurgery, Dr. Elonda Husky, who recommended repeat CT head in 6 hours, which showed no progression in patient's acute subarachnoid hemorrhage/acute subdural hematoma. Dr. Elonda Husky recommended [facility] admission for observation, conveying no indication for surgical intervention, and recommended outpatient follow-up in neurosurgery clinic. Trauma surgery consulted  Assessment and Plan: #) Acute subarachnoid hemorrhage/acute subdural hematoma after mechanical fall: -CT head showed subarachnoid hemorrhage along the right middle cranial fossa and right temporal lobe as well as acute 2 mm subdural hematoma along the left cerebral convexity, without any corresponding evidence of mass effect or midline shift. -EDP d/w case/imaging with on-call neurosurgery, who had recommended repeat 6-hour CT head, which showed stable findings, without any acute worsening in either the subdural hematoma nor subarachnoid hemorrhage.   -Neurosurgery reported  no indication for surgical intervention, recommending outpatient follow-up in neurosurgery clinic -Continue sbp less than 140   #) mechanical fall with L parietal bone fracture:  -parietal bone fracture on CT noted -Appreciate input by Trauma service -CXR and pelvic films were ordered, both neg. Reviewed. TBI therapies ordered as well -PT/OT consulted    #) Leukocytosis:  -Likely reactive -no evidence of active infection.    #) Type 2 Diabetes Mellitus:  -holding outpatient Ozempic for now. -cont SSI as needed   #) Chronic hypoxic respiratory failure in the setting of severe COPD:  -cont outpatient Trelegy. Prn albuterol nebulizer.  -currently on minimal o2 support   #) Essential Hypertension:  -BP stable at this time -continue lisinopril.   -Goal to keep sbp<140   #) Benign Prostatic Hyperplasia:   -seems stable. Continue home tamsulosin and Proscar.  OSA -Will cont on CPAP at night   Subjective: Without complaints. Eager to go home soon  Physical Exam: Vitals:   12/03/23 1221 12/03/23 1300 12/03/23 1625 12/03/23 1627  BP: 112/80 122/67 130/63 130/63  Pulse: 65 64 (!) 43 61  Resp: 18   18  Temp: 98.1 F (36.7 C)   98.6 F (37 C)  TempSrc: Oral   Axillary  SpO2: 99% 99% 93% 96%  Weight:      Height:       General exam: Awake, laying in bed, in nad Respiratory system: Normal respiratory effort, no wheezing Cardiovascular system: regular rate, s1, s2 Gastrointestinal system: Soft, nondistended, positive BS Central nervous system: CN2-12 grossly intact, strength intact Extremities: Perfused, no clubbing Skin: Normal skin turgor, no notable skin lesions seen Psychiatry: Mood normal // no visual hallucinations   Data Reviewed:  Labs reviewed: Na 138, K 3.9, Cr 0.76, WBC 10.1, Hgb 14.8  Family Communication: Pt  in room, family at bedside  Disposition: Status is: Inpatient Remains inpatient appropriate because: severity of illness  Planned Discharge  Destination: Home    Author: Rickey Barbara, MD 12/03/2023 5:11 PM  For on call review [email or web address].

## 2023-12-03 NOTE — Consult Note (Cosign Needed Addendum)
Patient ID: Brent Snow 11/13/1941  Admit date: 12/02/2023  HPI Brent Snow is a 82 y.o. male with PMHx of COPD (not on home o2), HTN, OSA on CPAP, DM2 who presented to [facility] ED after a fall. Patient reports around 4pm yesterday he was dragging a box of christmas decorations down the stairs, lost his balance and slipped backwards striking his head on cement. He believes this was the last 2-3 steps when he fell. Suspects LOC. Unclear duration. He denies lightheadedness, dizziness or prodromal symptoms prior to the fall. His wife that is at bedside reports there was blood on the cement were he had fallen. N/v after the event. Patient ambulatory after event.   His workup showed R SAH, L SDH and L parietal bone fx. EDP spoke with NSGY, Meghan Bergamn per report. Per pended EDP note and discussion with TRH the recommendation was for transfer for admission and observation. Repeat CTH stable with no new abnromality identified.   Patient additionally had CT c-spine that was negative for acute injury. His C-Collar has already been removed. A left elbow xray was negative for fx.   He is currently HDS without fever, tachycardia, hypotension or hypoxia (on RA). Last labs with stable hgb at 14.8, Cr wnl, K wnl, Na wnl, and WBC wnl.   He complains of HA. No visual changes, neck pain, midline back pain, chest pain, sob, abdominal pain, extremity pain, n/t/w. N/v has resolved. He is tolerating po. Voiding without issues. Has not been oob since transfer. He denies being on blood thinner at baseline.   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  Tertiary Survey: Trauma scans reviewed: Yes Additional Imaging recommendations: Yes: CXR and Pelvic xray Labs reviewed: Yes Additional lab work recommendations: No  Does the patient have any new complaints? No Cervical Collar Cleared: Yes -  already cleared DVT ppx ordered? No - will need clearance for dvt ppx from nsgy - to note, dosing for DVT prophylaxis in the setting of trauma and normal renal function is 30mg  BID or 40mg  BID if >100kg or BMI>35. Please start this as soon as cleared by NSGY.  Is patient age > 77 (82 y.o.) : Yes  Exam: Gen:  Alert, NAD, pleasant HEENT: Left posterior scalp abrasion that is hemostatic Neck: No C-spine ttp or step offs Card:  RRR. Radial and DP pulse 2+ Pulm:  CTAB, no W/R/R, effort normal. On RA. No ttp over chest wall  Abd: Soft, ND, NT, +BS Psych: A&Ox4 Skin: Warm and dry. Scattered abrasions to the BUE including forearms and hands. Left elbow bruising Neuro: Non-focal. MAE's. SILT to BUE and BLE. CN 3-12 grossly intact Msk:  RUE: No gross deformities of joints or skin unless otherwise mentioned above. Able  passive/active shoulder, elbow, wrist and digits range of motion without pain.  No tenderness over clavicle, shoulder, upper arm, elbow, forearm, wrists or hand. Radial 2+.  LUE: No gross deformities of joints or skin unless otherwise mentioned above. Able passive/active shoulder, elbow, wrist and hand range of motion without pain.  No tenderness over clavicle, shoulder, upper arm, elbow, forearm, wrists or hand. Radial 2+.  RLE: No gross deformities of joints or skin unless otherwise mentioned above. No sacral crepitus.  Negative logroll test. Able  passive/active range of motion of hip, knee and ankle without pain.  No  tenderness over hip, upper legs, knee, lower leg, ankle or foot.  No lower extremity edema.  No calf tenderness. DP 2+  LLE: No gross deformities of joints or skin unless otherwise mentioned above. No sacral crepitus.  Negative logroll test. Able  passive/active range of motion of hip, knee and ankle without pain.  No tenderness over hip, upper legs, knee, lower leg, ankle or feet.  No lower extremity edema.  No calf tenderness. DP 2+. Back: No midline thoracic or lumbar  tenderness or step-offs.     Incidental Findings: None  Wound Care: Cleans any abrasions with soap and water daily then apply Bacitrian for small wounds or cover w/ xeroform dressing for larger wounds.     Follow-up Appointments: NSGY, PCP  Frailty Score    Await final recs from NSGY. Will add keppra x 7d for seizure ppx given TBI and hx of seizures w/ prior TBI. TBI therapies ordered. Add multimodal pain control. Await Await CXR and Pelvic films. If films are negative for acute injury, will plan to s/o. Discussed plan with attending who agree's with outlined plan above. Discussed with TRH who also feels comfortable with above plan. Call back with any questions or concerns.   Jacinto Halim, PA-C

## 2023-12-03 NOTE — ED Notes (Signed)
ED TO INPATIENT HANDOFF REPORT  ED Nurse Name and Phone #: Hansel Starling [phone]  S Name/Age/Gender Brent Snow 82 y.o. male Room/Bed: 010C/010C  Code Status   Code Status: Full Code  Home/SNF/Other Home Patient oriented to: self, place, time, and situation Is this baseline? Yes   Triage Complete: Triage complete  Chief Complaint SDH (subdural hematoma) (HCC) [S06.5XAA]  Triage Note Tripped down last step of stairs. Struck head, laceration to back left skull. Bleeding stopped PTA. Disperse skin tears on arms. HX falls with brain bleed in past. No thinners. Possible LOC upon falling. Denies CP, SOB, dizziness.   Allergies No Known Allergies  Level of Care/Admitting Diagnosis ED Disposition     ED Disposition  Admit   Condition  --   Comment  Hospital Area: [facility] [[id number]]  Level of Care: Progressive [102]  Admit to Progressive based on following criteria: NEUROLOGICAL AND NEUROSURGICAL complex patients with significant risk of instability, who do not meet ICU criteria, yet require close observation or frequent assessment (< / = every 2 - 4 hours) with medical / nursing intervention.  May admit patient to [facility] or [facility] if equivalent level of care is available:: No  Covid Evaluation: Asymptomatic - no recent exposure (last 10 days) testing not required  Diagnosis: SDH (subdural hematoma) (HCC) [454098]  Admitting Physician: Gery Pray [[id number]]  Attending Physician: Gery Pray [[id number]]  Certification:: I certify this patient will need inpatient services for at least 2 midnights  Expected Medical Readiness: 12/04/2023          B Medical/Surgery History Past Medical History:  Diagnosis Date   Arthritis    COPD, severe (HCC)    pulmologist-  dr byrum   Dyspnea on exertion    prescribed to use O2 via [place]--- per pt he does not use O2 he just take 1-2 puffs of rescue inhaler   Hiatal hernia    History of acute respiratory  failure    02-18-2007  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   Hypertension    Hypoxemic respiratory failure, chronic (HCC)    pulmologist-  dr byrum-- prescribed supplemental O2 @ 2L via [place]    OSA on CPAP    and added O2 @ 2L via [place] w/ cpap   Phimosis    S/P balloon dilatation of esophageal stricture    multiple    Subdural hematoma (HCC)    in July '24 after fall   Type 2 diabetes mellitus (HCC)    followed by pcp--  per note uncontrolled w/ last A1c >14 on 12-30-2016   Wears contact lenses    Past Surgical History:  Procedure Laterality Date   BACK SURGERY     CIRCUMCISION N/A 01/27/2017   Procedure: CIRCUMCISION ADULT;  Surgeon: Marcine Matar, MD;  Location: [facility];  Service: Urology;  Laterality: N/A;   KNEE ARTHROSCOPY Right 2008   LUMBAR SPINE SURGERY  1977  approx.   TONSILLECTOMY AND ADENOIDECTOMY  age 76   TRANSTHORACIC ECHOCARDIOGRAM  09/12/2015   mild LVH,  ef 65-70%,  grade 1 diastolic dysfunction/  mild RAE     A IV Location/Drains/Wounds Patient Lines/Drains/Airways Status     Active Line/Drains/Airways     Name Placement date Placement time Site Days   Peripheral IV 12/02/23 20 G 1" Left Antecubital 12/02/23  1900  Antecubital  1   Peripheral IV 12/02/23 20 G 1" Left;Posterior Hand 12/02/23  1921  Hand  1            Intake/Output Last 24 hours  Intake/Output Summary (Last 24 hours) at 12/03/2023 1859 Last data filed at 12/02/2023 2031 Gross per 24 hour  Intake --  Output 200 ml  Net -200 ml    Labs/Imaging Results for orders placed or performed during the hospital encounter of 12/02/23 (from the past 48 hours)  CBC with Differential     Status: Abnormal   Collection Time: 12/02/23  7:20 PM  Result Value Ref Range   WBC 11.0 (H) 4.0 - 10.5 K/uL   RBC 4.81 4.22 - 5.81 MIL/uL   Hemoglobin 14.9 13.0 - 17.0 g/dL   HCT 72.5 36.6 - 44.0 %   MCV 94.8 80.0 - 100.0 fL   MCH 31.0 26.0 - 34.0 pg   MCHC 32.7 30.0 - 36.0  g/dL   RDW 34.7 42.5 - 95.6 %   Platelets 173 150 - 400 K/uL   nRBC 0.0 0.0 - 0.2 %   Neutrophils Relative % 87 %   Neutro Abs 9.5 (H) 1.7 - 7.7 K/uL   Lymphocytes Relative 7 %   Lymphs Abs 0.8 0.7 - 4.0 K/uL   Monocytes Relative 5 %   Monocytes Absolute 0.6 0.1 - 1.0 K/uL   Eosinophils Relative 0 %   Eosinophils Absolute 0.0 0.0 - 0.5 K/uL   Basophils Relative 0 %   Basophils Absolute 0.0 0.0 - 0.1 K/uL   Immature Granulocytes 1 %   Abs Immature Granulocytes 0.06 0.00 - 0.07 K/uL    Comment: Performed at [facility], [address], [place], [place] [address]  Basic metabolic panel     Status: Abnormal   Collection Time: 12/02/23  7:20 PM  Result Value Ref Range   Sodium 136 135 - 145 mmol/L   Potassium 3.7 3.5 - 5.1 mmol/L   Chloride 102 98 - 111 mmol/L   CO2 28 22 - 32 mmol/L   Glucose, Bld 204 (H) 70 - 99 mg/dL    Comment: Glucose reference range applies only to samples taken after fasting for at least 8 hours.   BUN 16 8 - 23 mg/dL   Creatinine, Ser 4.33 0.61 - 1.24 mg/dL   Calcium 8.7 (L) 8.9 - 10.3 mg/dL   GFR, Estimated >29 >51 mL/min    Comment: (NOTE) Calculated using the CKD-EPI Creatinine Equation (2021)    Anion gap 6 5 - 15    Comment: Performed at [facility], [address] [place], [place] [address]  Protime-INR     Status: None   Collection Time: 12/02/23  7:20 PM  Result Value Ref Range   Prothrombin Time 13.5 11.4 - 15.2 seconds   INR 1.0 0.8 - 1.2    Comment: (NOTE) INR goal varies based on device and disease states. Performed at [facility], [address] [place], [place] [address]   CK     Status: None   Collection Time: 12/02/23  7:20 PM  Result Value Ref Range   Total CK 54 49 - 397 U/L    Comment: Performed at [facility], [address], [place], [place] [address]  CBG monitoring, ED     Status: Abnormal   Collection Time: 12/02/23 10:38 PM  Result  Value Ref Range   Glucose-Capillary 116 (H) 70 - 99 mg/dL    Comment: Glucose reference range applies only to samples taken after fasting for at least 8 hours.  CBC with Differential/Platelet  Status: Abnormal   Collection Time: 12/03/23  3:43 AM  Result Value Ref Range   WBC 10.1 4.0 - 10.5 K/uL   RBC 4.78 4.22 - 5.81 MIL/uL   Hemoglobin 14.8 13.0 - 17.0 g/dL   HCT 20.2 54.2 - 70.6 %   MCV 95.8 80.0 - 100.0 fL   MCH 31.0 26.0 - 34.0 pg   MCHC 32.3 30.0 - 36.0 g/dL   RDW 23.7 62.8 - 31.5 %   Platelets 171 150 - 400 K/uL   nRBC 0.0 0.0 - 0.2 %   Neutrophils Relative % 81 %   Neutro Abs 8.3 (H) 1.7 - 7.7 K/uL   Lymphocytes Relative 10 %   Lymphs Abs 1.0 0.7 - 4.0 K/uL   Monocytes Relative 8 %   Monocytes Absolute 0.8 0.1 - 1.0 K/uL   Eosinophils Relative 0 %   Eosinophils Absolute 0.0 0.0 - 0.5 K/uL   Basophils Relative 0 %   Basophils Absolute 0.0 0.0 - 0.1 K/uL   Immature Granulocytes 1 %   Abs Immature Granulocytes 0.05 0.00 - 0.07 K/uL    Comment: Performed at [facility], [address] [place], [place] [address]  Comprehensive metabolic panel     Status: Abnormal   Collection Time: 12/03/23  3:43 AM  Result Value Ref Range   Sodium 138 135 - 145 mmol/L   Potassium 3.9 3.5 - 5.1 mmol/L   Chloride 105 98 - 111 mmol/L   CO2 24 22 - 32 mmol/L   Glucose, Bld 146 (H) 70 - 99 mg/dL    Comment: Glucose reference range applies only to samples taken after fasting for at least 8 hours.   BUN 10 8 - 23 mg/dL   Creatinine, Ser 0.73 0.61 - 1.24 mg/dL   Calcium 8.9 8.9 - 71.0 mg/dL   Total Protein 5.9 (L) 6.5 - 8.1 g/dL   Albumin 3.1 (L) 3.5 - 5.0 g/dL   AST 15 15 - 41 U/L   ALT 17 0 - 44 U/L   Alkaline Phosphatase 67 38 - 126 U/L   Total Bilirubin 0.4 <1.2 mg/dL   GFR, Estimated >62 >69 mL/min    Comment: (NOTE) Calculated using the CKD-EPI Creatinine Equation (2021)    Anion gap 9 5 - 15    Comment: Performed at [facility], [address],  [place], [place] [address]  Magnesium     Status: None   Collection Time: 12/03/23  3:43 AM  Result Value Ref Range   Magnesium 1.8 1.7 - 2.4 mg/dL    Comment: Performed at [facility], [address] [place], [place] [address]  CBG monitoring, ED     Status: Abnormal   Collection Time: 12/03/23  4:47 AM  Result Value Ref Range   Glucose-Capillary 138 (H) 70 - 99 mg/dL    Comment: Glucose reference range applies only to samples taken after fasting for at least 8 hours.  CBG monitoring, ED     Status: Abnormal   Collection Time: 12/03/23  8:10 AM  Result Value Ref Range   Glucose-Capillary 124 (H) 70 - 99 mg/dL    Comment: Glucose reference range applies only to samples taken after fasting for at least 8 hours.   Comment 1 Notify RN    Comment 2 Document in Chart   CBG monitoring, ED     Status: Abnormal   Collection Time: 12/03/23 11:34 AM  Result Value Ref Range   Glucose-Capillary 123 (  H) 70 - 99 mg/dL    Comment: Glucose reference range applies only to samples taken after fasting for at least 8 hours.  CBG monitoring, ED     Status: Abnormal   Collection Time: 12/03/23  5:18 PM  Result Value Ref Range   Glucose-Capillary 120 (H) 70 - 99 mg/dL    Comment: Glucose reference range applies only to samples taken after fasting for at least 8 hours.   DG Pelvis Portable Result Date: 12/03/2023 CLINICAL DATA:  Larey Seat down steps EXAM: PORTABLE PELVIS 1-2 VIEWS COMPARISON:  02/19/2007 FINDINGS: No fracture or evident dislocation. Degenerative changes in the visualized lower lumbar spine. IMPRESSION: No acute findings. Electronically Signed   By: Corlis Leak M.D.   On: 12/03/2023 15:29   DG CHEST PORT 1 VIEW Result Date: 12/03/2023 CLINICAL DATA:  Larey Seat down steps EXAM: PORTABLE CHEST - 1 VIEW COMPARISON:  03/14/2015 FINDINGS: Minimal linear scarring or atelectasis at the left lung base. Right lung clear. No pneumothorax. Heart size and mediastinal contours are within normal limits. Aortic  Atherosclerosis (ICD10-170.0). No effusion. Visualized bones unremarkable. IMPRESSION: Minimal left basilar scarring or atelectasis. Electronically Signed   By: Corlis Leak M.D.   On: 12/03/2023 15:29   CT Head Wo Contrast Result Date: 12/02/2023 CLINICAL DATA:  Follow-up subarachnoid hemorrhage EXAM: CT HEAD WITHOUT CONTRAST TECHNIQUE: Contiguous axial images were obtained from the base of the skull through the vertex without intravenous contrast. RADIATION DOSE REDUCTION: This exam was performed according to the departmental dose-optimization program which includes automated exposure control, adjustment of the mA and/or kV according to patient size and/or use of iterative reconstruction technique. COMPARISON:  CT from earlier in the same day. FINDINGS: Brain: Persistent subarachnoid hemorrhage in the right temporal lobe is noted. The overall appearance is similar to that seen on the prior exam. The left-sided subdural hematoma is stable in appearance as well unchanged from the prior exam. No new focal hemorrhage is seen. No acute infarct is noted. Vascular: No hyperdense vessel or unexpected calcification. Skull: The left parietal bone fracture is again noted but less apparent on this exam. Sinuses/Orbits: No acute finding. Other: Scalp hematoma is noted posteriorly on the left stable in appearance from the prior exam. IMPRESSION: Overall stable appearance of the head when compared with the earlier study. Subarachnoid hemorrhage is again noted in the right temporal lobe with contrecoup injury identified on the left with left parietal bone fracture. No new focal abnormality is noted. Electronically Signed   By: Alcide Clever M.D.   On: 12/02/2023 21:20   DG Elbow Complete Left Result Date: 12/02/2023 CLINICAL DATA:  Trip and fall on stairs with elbow laceration, initial encounter EXAM: LEFT ELBOW - COMPLETE 3+ VIEW COMPARISON:  None Available. FINDINGS: There is no evidence of fracture, dislocation, or joint  effusion. There is no evidence of arthropathy or other focal bone abnormality. Soft tissues are unremarkable. IMPRESSION: No acute abnormality noted. Electronically Signed   By: Alcide Clever M.D.   On: 12/02/2023 18:41   CT Head Wo Contrast Result Date: 12/02/2023 CLINICAL DATA:  Head trauma, moderate-severe; Neck trauma (Age >= 65y) EXAM: CT HEAD WITHOUT CONTRAST CT CERVICAL SPINE WITHOUT CONTRAST TECHNIQUE: Multidetector CT imaging of the head and cervical spine was performed following the standard protocol without intravenous contrast. Multiplanar CT image reconstructions of the cervical spine were also generated. RADIATION DOSE REDUCTION: This exam was performed according to the departmental dose-optimization program which includes automated exposure control, adjustment of the mA and/or kV  according to patient size and/or use of iterative reconstruction technique. COMPARISON:  None Available. FINDINGS: CT HEAD FINDINGS Brain: There is subarachnoid hemorrhage along the right middle cranial fossa and right temporal lobe. There is a 2 mm subdural hematoma along the left cerebral convexity (series 4, image 44). No evidence of intraventricular extension. No mass effect. No mass lesion. No CT evidence of an acute cortical infarct. Vascular: No hyperdense vessel or unexpected calcification. Skull: Left parietal scalp soft tissue hematoma. There is a nondisplaced fracture through left parietal bone (series 4, image 40). Sinuses/Orbits: No middle ear or mastoid effusion. Air-fluid level in the left maxillary sinus, which can be seen in the setting of acute sinusitis. Bilateral lens replacement. Orbits are otherwise unremarkable. Other: None. CT CERVICAL SPINE FINDINGS Alignment: Trace retrolisthesis of C4 on C5. Skull base and vertebrae: No acute fracture. No primary bone lesion or focal pathologic process. Soft tissues and spinal canal: No prevertebral fluid or swelling. No visible canal hematoma. Disc levels:  No  CT evidence of high-grade spinal canal stenosis Upper chest: Negative. Other: None IMPRESSION: 1. Subarachnoid hemorrhage along the right middle cranial fossa and right temporal lobe. 2. 2 mm subdural hematoma along the left cerebral convexity. 3. Nondisplaced fracture through the left parietal bone with overlying scalp soft tissue hematoma. 4. No acute cervical spine fracture. Discussed with Dr. Wallace Cullens on 12/02/23 at 6:29 PM. Electronically Signed   By: Lorenza Cambridge M.D.   On: 12/02/2023 18:29   CT Cervical Spine Wo Contrast Result Date: 12/02/2023 CLINICAL DATA:  Head trauma, moderate-severe; Neck trauma (Age >= 65y) EXAM: CT HEAD WITHOUT CONTRAST CT CERVICAL SPINE WITHOUT CONTRAST TECHNIQUE: Multidetector CT imaging of the head and cervical spine was performed following the standard protocol without intravenous contrast. Multiplanar CT image reconstructions of the cervical spine were also generated. RADIATION DOSE REDUCTION: This exam was performed according to the departmental dose-optimization program which includes automated exposure control, adjustment of the mA and/or kV according to patient size and/or use of iterative reconstruction technique. COMPARISON:  None Available. FINDINGS: CT HEAD FINDINGS Brain: There is subarachnoid hemorrhage along the right middle cranial fossa and right temporal lobe. There is a 2 mm subdural hematoma along the left cerebral convexity (series 4, image 44). No evidence of intraventricular extension. No mass effect. No mass lesion. No CT evidence of an acute cortical infarct. Vascular: No hyperdense vessel or unexpected calcification. Skull: Left parietal scalp soft tissue hematoma. There is a nondisplaced fracture through left parietal bone (series 4, image 40). Sinuses/Orbits: No middle ear or mastoid effusion. Air-fluid level in the left maxillary sinus, which can be seen in the setting of acute sinusitis. Bilateral lens replacement. Orbits are otherwise unremarkable.  Other: None. CT CERVICAL SPINE FINDINGS Alignment: Trace retrolisthesis of C4 on C5. Skull base and vertebrae: No acute fracture. No primary bone lesion or focal pathologic process. Soft tissues and spinal canal: No prevertebral fluid or swelling. No visible canal hematoma. Disc levels:  No CT evidence of high-grade spinal canal stenosis Upper chest: Negative. Other: None IMPRESSION: 1. Subarachnoid hemorrhage along the right middle cranial fossa and right temporal lobe. 2. 2 mm subdural hematoma along the left cerebral convexity. 3. Nondisplaced fracture through the left parietal bone with overlying scalp soft tissue hematoma. 4. No acute cervical spine fracture. Discussed with Dr. Wallace Cullens on 12/02/23 at 6:29 PM. Electronically Signed   By: Lorenza Cambridge M.D.   On: 12/02/2023 18:29    Pending Labs Unresulted Labs (From admission, onward)  Start     Ordered   12/04/23 0500  Comprehensive metabolic panel  Tomorrow morning,   R       Question:  Specimen collection method  Answer:  Lab=Lab collect   12/03/23 1755   12/04/23 0500  CBC  Tomorrow morning,   R       Question:  Specimen collection method  Answer:  Lab=Lab collect   12/03/23 1755   12/02/23 2339  Urinalysis, Complete w Microscopic -Urine, Clean Catch  Once,   R       Question:  Specimen Source  Answer:  Urine, Clean Catch   12/02/23 2338   12/02/23 2338  APTT  Add-on,   AD        12/02/23 2337   12/02/23 2259  Magnesium  Add-on,   AD        12/02/23 2258   12/02/23 2053  Hemoglobin A1c  Once,   URGENT       Comments: To assess prior glycemic control    12/02/23 2052            Vitals/Pain Today's Vitals   12/03/23 1300 12/03/23 1625 12/03/23 1627 12/03/23 1809  BP: 122/67 130/63 130/63   Pulse: 64 (!) 43 61   Resp:   18   Temp:   98.6 F (37 C)   TempSrc:   Axillary   SpO2: 99% 93% 96%   Weight:      Height:      PainSc:    0-No pain    Isolation Precautions No active isolations  Medications Medications   ondansetron (ZOFRAN) injection 4 mg (has no administration in time range)  tamsulosin (FLOMAX) capsule 0.4 mg (0.4 mg Oral Given 12/03/23 0915)  LORazepam (ATIVAN) injection 2 mg (has no administration in time range)  melatonin tablet 3 mg (has no administration in time range)  hydrALAZINE (APRESOLINE) injection 10 mg (has no administration in time range)  finasteride (PROSCAR) tablet 5 mg (5 mg Oral Given 12/03/23 0915)  fluticasone furoate-vilanterol (BREO ELLIPTA) 100-25 MCG/ACT 1 puff (1 puff Inhalation Given 12/03/23 0810)    And  umeclidinium bromide (INCRUSE ELLIPTA) 62.5 MCG/ACT 1 puff (1 puff Inhalation Given 12/03/23 0810)  lisinopril (ZESTRIL) tablet 10 mg (10 mg Oral Given 12/03/23 0915)  pravastatin (PRAVACHOL) tablet 40 mg (40 mg Oral Given 12/03/23 0915)  albuterol (PROVENTIL) (2.5 MG/3ML) 0.083% nebulizer solution 2.5 mg (has no administration in time range)  naloxone [facility]) injection 0.4 mg (has no administration in time range)  insulin aspart (novoLOG) injection 0-6 Units ( Subcutaneous Not Given 12/03/23 1746)  levETIRAcetam (KEPPRA) tablet 500 mg (500 mg Oral Given 12/03/23 1423)  acetaminophen (TYLENOL) tablet 1,000 mg (1,000 mg Oral Given 12/03/23 1423)  morphine (PF) 2 MG/ML injection 2 mg (has no administration in time range)  methocarbamol (ROBAXIN) tablet 500 mg (has no administration in time range)  oxyCODONE (Oxy IR/ROXICODONE) immediate release tablet 2.5-5 mg (has no administration in time range)  metoprolol tartrate (LOPRESSOR) injection 5 mg (5 mg Intravenous Given 12/02/23 1934)  lisinopril (ZESTRIL) tablet 20 mg (20 mg Oral Given 12/02/23 2234)  hydrALAZINE (APRESOLINE) injection 10 mg (10 mg Intravenous Given 12/02/23 2110)    Mobility walks with person assist     Focused Assessments Neuro Assessment Handoff:  Swallow screen pass? Yes  Cardiac Rhythm: Normal sinus rhythm       Neuro Assessment: Within Defined Limits Neuro Checks:      Has  TPA been given? No If patient is  a Neuro Trauma and patient is going to OR before floor call report to 4N Charge nurse: [phone] or [phone]   R Recommendations: See Admitting Provider Note  Report given to:   Additional Notes:

## 2023-12-04 DIAGNOSIS — I609 Nontraumatic subarachnoid hemorrhage, unspecified: Secondary | ICD-10-CM | POA: Diagnosis not present

## 2023-12-04 DIAGNOSIS — S065XAA Traumatic subdural hemorrhage with loss of consciousness status unknown, initial encounter: Secondary | ICD-10-CM | POA: Diagnosis not present

## 2023-12-04 DIAGNOSIS — S0219XA Other fracture of base of skull, initial encounter for closed fracture: Secondary | ICD-10-CM | POA: Diagnosis not present

## 2023-12-04 LAB — COMPREHENSIVE METABOLIC PANEL
ALT: 15 U/L (ref 0–44)
AST: 14 U/L — ABNORMAL LOW (ref 15–41)
Albumin: 2.8 g/dL — ABNORMAL LOW (ref 3.5–5.0)
Alkaline Phosphatase: 60 U/L (ref 38–126)
Anion gap: 6 (ref 5–15)
BUN: 11 mg/dL (ref 8–23)
CO2: 26 mmol/L (ref 22–32)
Calcium: 8.5 mg/dL — ABNORMAL LOW (ref 8.9–10.3)
Chloride: 105 mmol/L (ref 98–111)
Creatinine, Ser: 0.88 mg/dL (ref 0.61–1.24)
GFR, Estimated: >60 mL/min (ref >60)
Glucose, Bld: 118 mg/dL — ABNORMAL HIGH (ref 70–99)
Potassium: 4 mmol/L (ref 3.5–5.1)
Sodium: 137 mmol/L (ref 135–145)
Total Bilirubin: 0.5 mg/dL (ref <1.2)
Total Protein: 5.5 g/dL — ABNORMAL LOW (ref 6.5–8.1)

## 2023-12-04 LAB — URINALYSIS, COMPLETE (UACMP) WITH MICROSCOPIC
Bacteria, UA: NONE SEEN
Bilirubin Urine: NEGATIVE
Glucose, UA: 150 mg/dL — AB
Hgb urine dipstick: NEGATIVE
Ketones, ur: NEGATIVE mg/dL
Nitrite: NEGATIVE
Protein, ur: NEGATIVE mg/dL
Specific Gravity, Urine: 1.014 (ref 1.005–1.030)
pH: 5 (ref 5.0–8.0)

## 2023-12-04 LAB — CBC
HCT: 44.1 % (ref 39.0–52.0)
Hemoglobin: 14.5 g/dL (ref 13.0–17.0)
MCH: 31.4 pg (ref 26.0–34.0)
MCHC: 32.9 g/dL (ref 30.0–36.0)
MCV: 95.5 fL (ref 80.0–100.0)
Platelets: 163 10*3/uL (ref 150–400)
RBC: 4.62 MIL/uL (ref 4.22–5.81)
RDW: 13 % (ref 11.5–15.5)
WBC: 6.8 10*3/uL (ref 4.0–10.5)
nRBC: 0 % (ref 0.0–0.2)

## 2023-12-04 LAB — GLUCOSE, CAPILLARY: Glucose-Capillary: 127 mg/dL — ABNORMAL HIGH (ref 70–99)

## 2023-12-04 NOTE — Progress Notes (Signed)
Transition of Care [facility]) - CAGE-AID Screening   Patient Details  Name: Brent Snow MRN: [id number] Date of Birth: 04-19-1941  [facility]) CM/SW Contact:    Leota Sauers, RN Phone Number: 12/04/2023, 12:34 AM   Clinical Narrative:  Patient endorses current alcohol use, denies the use of illicit substances. Resources not given at this time.   CAGE-AID Screening:    Have You Ever Felt You Ought to Cut Down on Your Drinking or Drug Use?: No Have People Annoyed You By Critizing Your Drinking Or Drug Use?: No Have You Felt Bad Or Guilty About Your Drinking Or Drug Use?: No Have You Ever Had a Drink or Used Drugs First Thing In The Morning to Steady Your Nerves or to Get Rid of a Hangover?: No CAGE-AID Score: 0  Substance Abuse Education Offered: No

## 2023-12-04 NOTE — Evaluation (Signed)
Occupational Therapy Evaluation Patient Details Name: Brent Snow MRN: [id number] DOB: September 15, 1941 Today's Date: 12/04/2023   History of Present Illness 82 y.o. male presents to [facility] hospital on 12/02/2023 with acute SAH and SDH after a fall at home. PMH includes HTN, COPD, DMII, BPH.   Clinical Impression   Pt currently modified independent to independent for selfcare tasks sit to stand, functional mobility, and for tub/shower transfers without use of an assistive device.  Feel he is at his normal functional baseline, but does exhibit a slight limp secondary to history of back issues.  Discussed benefit of using his shower seat for bathing and both pt's spouse and their daughter agree, however pt may still decline use.  No further acute or post acute OT needs at this time.           Functional Status Assessment  Patient has not had a recent decline in their functional status  Equipment Recommendations  None recommended by OT       Precautions / Restrictions Precautions Precautions: Fall Restrictions Weight Bearing Restrictions Per Provider Order: No      Mobility Bed Mobility Overal bed mobility: Modified Independent                  Transfers Overall transfer level: Independent Equipment used: None                      Balance Overall balance assessment: Mild deficits observed, not formally tested                                         ADL either performed or assessed with clinical judgement   ADL Overall ADL's : Modified independent;At baseline                                       General ADL Comments: Pt currently modified independent for simulated selfcare tasks, toilet transfers, and tub/shower transfers.  Pt has access to a walk-in shower with seat and encouraged use of both for safety.  Both he and family voice understanding.  Pt with noticeable limp on the right with mobility, which he reports has been there  for a while secondary to history of back injury.     Vision Baseline Vision/History: 0 No visual deficits Ability to See in Adequate Light: 0 Adequate Patient Visual Report: No change from baseline Vision Assessment?: No apparent visual deficits Additional Comments: Occular ROM and tracking WFLs.  Peripheral fields seem intact from functional mobility and navigation around his room, hallway, and therapy gym.     Perception Perception: Within Functional Limits       Praxis Praxis: WFL       Pertinent Vitals/Pain Pain Assessment Pain Assessment: No/denies pain     Extremity/Trunk Assessment Upper Extremity Assessment Upper Extremity Assessment: Overall WFL for tasks assessed   Lower Extremity Assessment Lower Extremity Assessment: Defer to PT evaluation   Cervical / Trunk Assessment Cervical / Trunk Assessment: Normal   Communication Communication Communication: No apparent difficulties Cueing Techniques: Verbal cues   Cognition Arousal: Alert Behavior During Therapy: WFL for tasks assessed/performed Overall Cognitive Status: Within Functional Limits for tasks assessed  General Comments  VSS on RA            Home Living Family/patient expects to be discharged to:: Private residence Living Arrangements: Spouse/significant other Available Help at Discharge: Family;Available 24 hours/day Type of Home: House Home Access: Stairs to enter [employer] of Steps: 2 Entrance Stairs-Rails: Can reach both Home Layout: Two level;Able to live on main level with bedroom/bathroom     Bathroom Shower/Tub: Walk-in shower;Tub/shower unit (uses both)   Firefighter: Standard     Home Equipment: Chartered certified accountant - single Librarian, academic (2 wheels);Wheelchair - power;Shower seat - built in          Prior Functioning/Environment Prior Level of Function : Independent/Modified Independent              Mobility Comments: ambulatory without DME, takes breaks when mobilizing in the community due to COPD           AM-PAC OT "6 Clicks" Daily Activity     Outcome Measure Help from another person eating meals?: None Help from another person taking care of personal grooming?: None Help from another person toileting, which includes using toliet, bedpan, or urinal?: None Help from another person bathing (including washing, rinsing, drying)?: None Help from another person to put on and taking off regular upper body clothing?: None Help from another person to put on and taking off regular lower body clothing?: None 6 Click Score: 24   End of Session Nurse Communication: Mobility status  Activity Tolerance: Patient tolerated treatment well Patient left: in bed;with call bell/phone within reach                   Time: 1120-1145 OT Time Calculation (min): 25 min Charges:  OT General Charges $OT Visit: 1 Visit OT Evaluation $OT Eval Low Complexity: 1 Low OT Treatments $Self Care/Home Management : 8-22 mins Perrin Maltese, OTR/L Acute Rehabilitation Services  Office [phone] 12/04/2023

## 2023-12-04 NOTE — Discharge Summary (Signed)
Physician Discharge Summary   Patient: Brent Snow MRN: [id number] DOB: Oct 13, 1941  Admit date:     12/02/2023  Discharge date: 12/04/23  Discharge Physician: Rickey Barbara   PCP: Raliegh Ip, DO   Recommendations at discharge:    Follow up with PCP in 1-2 weeks Follow up with primary Neurosurgeon as will be scheduled  Discharge Diagnoses: Principal Problem:   SAH (subarachnoid hemorrhage) (HCC) Active Problems:   Essential hypertension   COPD (chronic obstructive pulmonary disease) (HCC)   Chronic hypoxic respiratory failure (HCC)   DM2 (diabetes mellitus, type 2) (HCC)   BPH (benign prostatic hyperplasia)   Subdural hematoma (HCC)   Fall at home, initial encounter   Closed fracture of parietal bone (HCC)   Leukocytosis  Resolved Problems:   * No resolved hospital problems. *  Hospital Course: 82 y.o. male with medical history significant for type 2 diabetes mellitus, severe COPD, chronic hypoxic respiratory failure on 2 L continuous nasal cannula, essential hypertension, subdural hematoma in July 2024, BPH, who is admitted to [facility] on 12/02/2023 by way of transfer from [facility] with acute subarachnoid hemorrhage and acute subdural hematoma after mechanical fall at home after presenting from home to [facility] ED complaining of fall. CT head, showed subarachnoid hemorrhage along the right middle cranial fossa and right temporal lobe, as well as 2 mm subdural hematoma along the left cerebral convexity, without any evidence of mass effect or midline shift. CT head also showed nondisplaced fracture through the left parietal bone with overlying scalp soft tissue hematoma. EDP at [facility] d/w on-call neurosurgery, Dr. Elonda Husky, who recommended repeat CT head in 6 hours, which showed no progression in patient's acute subarachnoid hemorrhage/acute subdural hematoma. Dr. Elonda Husky recommended [facility] admission for observation, conveying no indication for surgical intervention, and  recommended outpatient follow-up in neurosurgery clinic. Trauma surgery consulted  Assessment and Plan: #) Acute subarachnoid hemorrhage/acute subdural hematoma after mechanical fall: -CT head showed subarachnoid hemorrhage along the right middle cranial fossa and right temporal lobe as well as acute 2 mm subdural hematoma along the left cerebral convexity, without any corresponding evidence of mass effect or midline shift. -EDP d/w case/imaging with on-call neurosurgery, who had recommended repeat 6-hour CT head, which showed stable findings, without any acute worsening in either the subdural hematoma nor subarachnoid hemorrhage.   -Neurosurgery reported no indication for surgical intervention, recommending outpatient follow-up in neurosurgery clinic. Pt already is already established with Neurosurgery from before   #) mechanical fall with L parietal bone fracture:  -parietal bone fracture on CT noted -Appreciate input by Trauma service -CXR and pelvic films were ordered, both neg. Reviewed.  -PT/OT consulted, no needs identified   #) Leukocytosis:  -Likely reactive -no evidence of active infection.    #) Type 2 Diabetes Mellitus:  -holding outpatient Ozempic for now. -cont SSI as needed while in hospital   #) Chronic hypoxic respiratory failure in the setting of severe COPD:  -cont outpatient Trelegy. Prn albuterol nebulizer.  -currently on minimal o2 support   #) Essential Hypertension:  -BP stable at this time -continue lisinopril.   -Goal to keep sbp<140   #) Benign Prostatic Hyperplasia:   -seems stable. Continue home tamsulosin and Proscar.   OSA -cont on CPAP at night    Consultants: Neurosurgeon, Trauma surgery Procedures performed:   Disposition: Home Diet recommendation:  Regular diet DISCHARGE MEDICATION: Allergies as of 12/04/2023   No Known Allergies      Medication List  TAKE these medications    Albuterol Sulfate 108 (90 Base) MCG/ACT  Aepb Commonly known as: PROAIR RESPICLICK Inhale 2 puffs into the lungs every 6 (six) hours as needed.   finasteride 5 MG tablet Commonly known as: PROSCAR Take 1 tablet (5 mg total) by mouth daily.   lisinopril 10 MG tablet Commonly known as: ZESTRIL Take 1 tablet (10 mg total) by mouth daily.   Ozempic (0.25 or 0.5 MG/DOSE) 2 MG/3ML Sopn Generic drug: Semaglutide(0.25 or 0.5MG /DOS) Inject 0.5 mg into the skin every 7 (seven) days.   pravastatin 40 MG tablet Commonly known as: PRAVACHOL Take 1 tablet (40 mg total) by mouth daily.   tamsulosin 0.4 MG Caps capsule Commonly known as: FLOMAX Take 1 capsule (0.4 mg total) by mouth 2 (two) times daily. What changed: when to take this   Trelegy Ellipta 100-62.5-25 MCG/ACT Aepb Generic drug: Fluticasone-Umeclidin-Vilant USE 1 INHALATION ORALLY    DAILY        Follow-up Information     Delynn Flavin M, DO Follow up in 2 week(s).   Specialty: Family Medicine Why: Hospital follow up Contact information: [address] [place] [address] [phone]         Follow up with your Neurosurgeon Follow up in 2 week(s).   Why: Hospital follow up               Discharge Exam: Filed Weights   12/02/23 1738 12/03/23 2013 12/04/23 0535  Weight: 82.1 kg 85.7 kg 86.9 kg   General exam: Awake, laying in bed, in nad Respiratory system: Normal respiratory effort, no wheezing Cardiovascular system: regular rate, s1, s2 Gastrointestinal system: Soft, nondistended, positive BS Central nervous system: CN2-12 grossly intact, strength intact Extremities: Perfused, no clubbing Skin: Normal skin turgor, no notable skin lesions seen Psychiatry: Mood normal // no visual hallucinations    Condition at discharge: fair  The results of significant diagnostics from this hospitalization (including imaging, microbiology, ancillary and laboratory) are listed below for reference.   Imaging Studies: DG Pelvis Portable Result Date:  12/03/2023 CLINICAL DATA:  Larey Seat down steps EXAM: PORTABLE PELVIS 1-2 VIEWS COMPARISON:  02/19/2007 FINDINGS: No fracture or evident dislocation. Degenerative changes in the visualized lower lumbar spine. IMPRESSION: No acute findings. Electronically Signed   By: Corlis Leak M.D.   On: 12/03/2023 15:29   DG CHEST PORT 1 VIEW Result Date: 12/03/2023 CLINICAL DATA:  Larey Seat down steps EXAM: PORTABLE CHEST - 1 VIEW COMPARISON:  03/14/2015 FINDINGS: Minimal linear scarring or atelectasis at the left lung base. Right lung clear. No pneumothorax. Heart size and mediastinal contours are within normal limits. Aortic Atherosclerosis (ICD10-170.0). No effusion. Visualized bones unremarkable. IMPRESSION: Minimal left basilar scarring or atelectasis. Electronically Signed   By: Corlis Leak M.D.   On: 12/03/2023 15:29   CT Head Wo Contrast Result Date: 12/02/2023 CLINICAL DATA:  Follow-up subarachnoid hemorrhage EXAM: CT HEAD WITHOUT CONTRAST TECHNIQUE: Contiguous axial images were obtained from the base of the skull through the vertex without intravenous contrast. RADIATION DOSE REDUCTION: This exam was performed according to the departmental dose-optimization program which includes automated exposure control, adjustment of the mA and/or kV according to patient size and/or use of iterative reconstruction technique. COMPARISON:  CT from earlier in the same day. FINDINGS: Brain: Persistent subarachnoid hemorrhage in the right temporal lobe is noted. The overall appearance is similar to that seen on the prior exam. The left-sided subdural hematoma is stable in appearance as well unchanged from the prior exam. No  new focal hemorrhage is seen. No acute infarct is noted. Vascular: No hyperdense vessel or unexpected calcification. Skull: The left parietal bone fracture is again noted but less apparent on this exam. Sinuses/Orbits: No acute finding. Other: Scalp hematoma is noted posteriorly on the left stable in appearance from the  prior exam. IMPRESSION: Overall stable appearance of the head when compared with the earlier study. Subarachnoid hemorrhage is again noted in the right temporal lobe with contrecoup injury identified on the left with left parietal bone fracture. No new focal abnormality is noted. Electronically Signed   By: Alcide Clever M.D.   On: 12/02/2023 21:20   DG Elbow Complete Left Result Date: 12/02/2023 CLINICAL DATA:  Trip and fall on stairs with elbow laceration, initial encounter EXAM: LEFT ELBOW - COMPLETE 3+ VIEW COMPARISON:  None Available. FINDINGS: There is no evidence of fracture, dislocation, or joint effusion. There is no evidence of arthropathy or other focal bone abnormality. Soft tissues are unremarkable. IMPRESSION: No acute abnormality noted. Electronically Signed   By: Alcide Clever M.D.   On: 12/02/2023 18:41   CT Head Wo Contrast Result Date: 12/02/2023 CLINICAL DATA:  Head trauma, moderate-severe; Neck trauma (Age >= 65y) EXAM: CT HEAD WITHOUT CONTRAST CT CERVICAL SPINE WITHOUT CONTRAST TECHNIQUE: Multidetector CT imaging of the head and cervical spine was performed following the standard protocol without intravenous contrast. Multiplanar CT image reconstructions of the cervical spine were also generated. RADIATION DOSE REDUCTION: This exam was performed according to the departmental dose-optimization program which includes automated exposure control, adjustment of the mA and/or kV according to patient size and/or use of iterative reconstruction technique. COMPARISON:  None Available. FINDINGS: CT HEAD FINDINGS Brain: There is subarachnoid hemorrhage along the right middle cranial fossa and right temporal lobe. There is a 2 mm subdural hematoma along the left cerebral convexity (series 4, image 44). No evidence of intraventricular extension. No mass effect. No mass lesion. No CT evidence of an acute cortical infarct. Vascular: No hyperdense vessel or unexpected calcification. Skull: Left parietal  scalp soft tissue hematoma. There is a nondisplaced fracture through left parietal bone (series 4, image 40). Sinuses/Orbits: No middle ear or mastoid effusion. Air-fluid level in the left maxillary sinus, which can be seen in the setting of acute sinusitis. Bilateral lens replacement. Orbits are otherwise unremarkable. Other: None. CT CERVICAL SPINE FINDINGS Alignment: Trace retrolisthesis of C4 on C5. Skull base and vertebrae: No acute fracture. No primary bone lesion or focal pathologic process. Soft tissues and spinal canal: No prevertebral fluid or swelling. No visible canal hematoma. Disc levels:  No CT evidence of high-grade spinal canal stenosis Upper chest: Negative. Other: None IMPRESSION: 1. Subarachnoid hemorrhage along the right middle cranial fossa and right temporal lobe. 2. 2 mm subdural hematoma along the left cerebral convexity. 3. Nondisplaced fracture through the left parietal bone with overlying scalp soft tissue hematoma. 4. No acute cervical spine fracture. Discussed with Dr. Wallace Cullens on 12/02/23 at 6:29 PM. Electronically Signed   By: Lorenza Cambridge M.D.   On: 12/02/2023 18:29   CT Cervical Spine Wo Contrast Result Date: 12/02/2023 CLINICAL DATA:  Head trauma, moderate-severe; Neck trauma (Age >= 65y) EXAM: CT HEAD WITHOUT CONTRAST CT CERVICAL SPINE WITHOUT CONTRAST TECHNIQUE: Multidetector CT imaging of the head and cervical spine was performed following the standard protocol without intravenous contrast. Multiplanar CT image reconstructions of the cervical spine were also generated. RADIATION DOSE REDUCTION: This exam was performed according to the departmental dose-optimization program which includes automated exposure  control, adjustment of the mA and/or kV according to patient size and/or use of iterative reconstruction technique. COMPARISON:  None Available. FINDINGS: CT HEAD FINDINGS Brain: There is subarachnoid hemorrhage along the right middle cranial fossa and right temporal lobe.  There is a 2 mm subdural hematoma along the left cerebral convexity (series 4, image 44). No evidence of intraventricular extension. No mass effect. No mass lesion. No CT evidence of an acute cortical infarct. Vascular: No hyperdense vessel or unexpected calcification. Skull: Left parietal scalp soft tissue hematoma. There is a nondisplaced fracture through left parietal bone (series 4, image 40). Sinuses/Orbits: No middle ear or mastoid effusion. Air-fluid level in the left maxillary sinus, which can be seen in the setting of acute sinusitis. Bilateral lens replacement. Orbits are otherwise unremarkable. Other: None. CT CERVICAL SPINE FINDINGS Alignment: Trace retrolisthesis of C4 on C5. Skull base and vertebrae: No acute fracture. No primary bone lesion or focal pathologic process. Soft tissues and spinal canal: No prevertebral fluid or swelling. No visible canal hematoma. Disc levels:  No CT evidence of high-grade spinal canal stenosis Upper chest: Negative. Other: None IMPRESSION: 1. Subarachnoid hemorrhage along the right middle cranial fossa and right temporal lobe. 2. 2 mm subdural hematoma along the left cerebral convexity. 3. Nondisplaced fracture through the left parietal bone with overlying scalp soft tissue hematoma. 4. No acute cervical spine fracture. Discussed with Dr. Wallace Cullens on 12/02/23 at 6:29 PM. Electronically Signed   By: Lorenza Cambridge M.D.   On: 12/02/2023 18:29    Microbiology: Results for orders placed or performed in visit on 03/29/23  Microscopic Examination     Status: Abnormal   Collection Time: 03/29/23 10:14 AM   Urine  Result Value Ref Range Status   WBC, UA 11-30 (A) 0 - 5 /hpf Final   RBC, Urine 0-2 0 - 2 /hpf Final   Epithelial Cells (non renal) 0-10 0 - 10 /hpf Final   Bacteria, UA None seen None seen/Few Final    Labs: CBC: Recent Labs  Lab 12/02/23 1920 12/03/23 0343 12/04/23 0436  WBC 11.0* 10.1 6.8  NEUTROABS 9.5* 8.3*  --   HGB 14.9 14.8 14.5  HCT 45.6  45.8 44.1  MCV 94.8 95.8 95.5  PLT 173 171 163   Basic Metabolic Panel: Recent Labs  Lab 12/02/23 1920 12/03/23 0343 12/04/23 0436  NA 136 138 137  K 3.7 3.9 4.0  CL 102 105 105  CO2 28 24 26   GLUCOSE 204* 146* 118*  BUN 16 10 11   CREATININE 0.84 0.76 0.88  CALCIUM 8.7* 8.9 8.5*  MG  --  1.8  --    Liver Function Tests: Recent Labs  Lab 12/03/23 0343 12/04/23 0436  AST 15 14*  ALT 17 15  ALKPHOS 67 60  BILITOT 0.4 0.5  PROT 5.9* 5.5*  ALBUMIN 3.1* 2.8*   CBG: Recent Labs  Lab 12/03/23 0810 12/03/23 1134 12/03/23 1718 12/03/23 2120 12/04/23 0611  GLUCAP 124* 123* 120* 230* 127*    Discharge time spent: less than 30 minutes.  Signed: Rickey Barbara, MD [facility] 12/04/2023

## 2023-12-04 NOTE — Progress Notes (Signed)
   Providing Compassionate, Quality Care - Together   Patient with [facility] and small left SDH following a fall onto a cement floor. The patient is not anticoagulated. Recommend follow up scan in 6 hours. If scan and patient's neurologic exam are stable, he is fine to discharge home. No need for outpatient follow up.  Val Eagle, DNP, AGNP-C Nurse Practitioner 12/02/2023, 7:30 PM  [facility] [address], [address], [place], [place] [address] P: [phone]    F: [phone]

## 2023-12-04 NOTE — Evaluation (Signed)
Physical Therapy Evaluation Patient Details Name: Brent Snow MRN: [id number] DOB: 25-Oct-1941 Today's Date: 12/04/2023  History of Present Illness  82 y.o. male presents to [facility] hospital on 12/02/2023 with acute SAH and SDH after a fall at home. PMH includes HTN, COPD, DMII, BPH.  Clinical Impression  Pt presents to PT with deficits in gait, balance, power, endurance, however he is not far from his baseline. Pt and spouse report chronic low back pain resulting in gait deviations including reduced stance time on RLE. Pt is able to ambulate for household distances during session, requiring 2 brief standing rest breaks due to fatigue. Pt does not demonstrate the need for post-acute PT follow-up as he is not far from his baseline.        If plan is discharge home, recommend the following: Assistance with cooking/housework;Assist for transportation   Can travel by private vehicle        Equipment Recommendations None recommended by PT  Recommendations for Other Services       Functional Status Assessment Patient has had a recent decline in their functional status and demonstrates the ability to make significant improvements in function in a reasonable and predictable amount of time.     Precautions / Restrictions Precautions Precautions: Fall Restrictions Weight Bearing Restrictions Per Provider Order: No      Mobility  Bed Mobility Overal bed mobility: Modified Independent                  Transfers Overall transfer level: Independent Equipment used: None                    Ambulation/Gait Ambulation/Gait assistance: Supervision Gait Distance (Feet): 300 Feet Assistive device: None Gait Pattern/deviations: Step-through pattern, Decreased stance time - right Gait velocity: functional Gait velocity interpretation: 1.31 - 2.62 ft/sec, indicative of limited community ambulator   General Gait Details: pt with step-through gait with reduced stance time on  RLE, increased lateral trunk sway but no overt losses of balance noted  Stairs Stairs: Yes Stairs assistance: Supervision Stair Management: Two rails, Alternating pattern, Forwards Number of Stairs: 2    Wheelchair Mobility     Tilt Bed    Modified Rankin (Stroke Patients Only)       Balance Overall balance assessment: Needs assistance Sitting-balance support: No upper extremity supported, Feet supported Sitting balance-Leahy Scale: Good     Standing balance support: No upper extremity supported, During functional activity Standing balance-Leahy Scale: Good                               Pertinent Vitals/Pain Pain Assessment Pain Assessment: No/denies pain    Home Living Family/patient expects to be discharged to:: Private residence Living Arrangements: Spouse/significant other Available Help at Discharge: Family;Available 24 hours/day Type of Home: House Home Access: Stairs to enter Entrance Stairs-Rails: Can reach both Entrance Stairs-Number of Steps: 2   Home Layout: Two level;Able to live on main level with bedroom/bathroom Home Equipment: Chartered certified accountant - single point      Prior Function Prior Level of Function : Independent/Modified Independent             Mobility Comments: ambulatory without DME, takes breaks when mobilizing in the community due to COPD       Extremity/Trunk Assessment   Upper Extremity Assessment Upper Extremity Assessment: Overall WFL for tasks assessed    Lower Extremity Assessment Lower Extremity Assessment: Overall WFL for  tasks assessed    Cervical / Trunk Assessment Cervical / Trunk Assessment: Normal  Communication   Communication Communication: No apparent difficulties Cueing Techniques: Verbal cues  Cognition Arousal: Alert Behavior During Therapy: WFL for tasks assessed/performed Overall Cognitive Status: Within Functional Limits for tasks assessed                                           General Comments General comments (skin integrity, edema, etc.): VSS on RA    Exercises     Assessment/Plan    PT Assessment Patient needs continued PT services  PT Problem List Decreased activity tolerance;Decreased balance;Decreased mobility       PT Treatment Interventions DME instruction;Gait training;Stair training;Functional mobility training;Balance training;Neuromuscular re-education;Patient/family education    PT Goals (Current goals can be found in the Care Plan section)  Acute Rehab PT Goals Patient Stated Goal: to return home PT Goal Formulation: With patient Time For Goal Achievement: 12/18/23 Potential to Achieve Goals: Good Additional Goals Additional Goal #1: Pt will score >19/24 on the DGI to indicate a reduced risk for falls Additional Goal #2: Pt will score >45/56 on the BERG to indicate a reduced risk for falls    Frequency Min 1X/week     Co-evaluation               AM-PAC PT "6 Clicks" Mobility  Outcome Measure Help needed turning from your back to your side while in a flat bed without using bedrails?: None Help needed moving from lying on your back to sitting on the side of a flat bed without using bedrails?: None Help needed moving to and from a bed to a chair (including a wheelchair)?: None Help needed standing up from a chair using your arms (e.g., wheelchair or bedside chair)?: None Help needed to walk in hospital room?: A Little Help needed climbing 3-5 steps with a railing? : A Little 6 Click Score: 22    End of Session   Activity Tolerance: Patient tolerated treatment well Patient left: in bed;with call bell/phone within reach;with family/visitor present Nurse Communication: Mobility status PT Visit Diagnosis: Other abnormalities of gait and mobility (R26.89)    Time: 6578-4696 PT Time Calculation (min) (ACUTE ONLY): 14 min   Charges:   PT Evaluation $PT Eval Low Complexity: 1 Low   PT General Charges $$  ACUTE PT VISIT: 1 Visit         Arlyss Gandy, PT, DPT Acute Rehabilitation Office [phone]   Arlyss Gandy 12/04/2023, 10:43 AM

## 2023-12-04 NOTE — Plan of Care (Signed)
°  Problem: Fluid Volume: Goal: Ability to maintain a balanced intake and output will improve 12/04/2023 0322 by Precious Bard, RN Outcome: Progressing 12/04/2023 0321 by Precious Bard, RN Outcome: Progressing   Problem: Metabolic: Goal: Ability to maintain appropriate glucose levels will improve 12/04/2023 0322 by Precious Bard, RN Outcome: Progressing 12/04/2023 0321 by Precious Bard, RN Outcome: Progressing   Problem: Skin Integrity: Goal: Risk for impaired skin integrity will decrease Outcome: Progressing   Problem: Education: Goal: Knowledge of disease or condition will improve Outcome: Progressing Goal: Knowledge of secondary prevention will improve (MUST DOCUMENT ALL) Outcome: Progressing Goal: Knowledge of patient specific risk factors will improve Loraine Leriche N/A or DELETE if not current risk factor) Outcome: Progressing   Problem: Spontaneous Subarachnoid Hemorrhage Tissue Perfusion: Goal: Complications of Spontaneous Subarachnoid Hemorrhage will be minimized Outcome: Progressing   Problem: Coping: Goal: Will verbalize positive feelings about self Outcome: Progressing Goal: Will identify appropriate support needs Outcome: Progressing   Problem: Health Behavior/Discharge Planning: Goal: Ability to manage health-related needs will improve Outcome: Progressing Goal: Goals will be collaboratively established with patient/family Outcome: Progressing   Problem: Self-Care: Goal: Ability to participate in self-care as condition permits will improve Outcome: Progressing Goal: Verbalization of feelings and concerns over difficulty with self-care will improve Outcome: Progressing Goal: Ability to communicate needs accurately will improve Outcome: Progressing   Problem: Nutrition: Goal: Risk of aspiration will decrease Outcome: Progressing Goal: Dietary intake will improve Outcome: Progressing

## 2023-12-05 ENCOUNTER — Telehealth: Payer: Self-pay | Admitting: *Deleted

## 2023-12-05 LAB — HEMOGLOBIN A1C
Hgb A1c MFr Bld: 7 % — ABNORMAL HIGH (ref 4.8–5.6)
Mean Plasma Glucose: 154 mg/dL

## 2023-12-05 NOTE — Transitions of Care (Post Inpatient/ED Visit) (Signed)
12/05/2023  Name: Brent Snow MRN: [id number] DOB: 03-31-1941  Today's TOC FU Call Status: Today's TOC FU Call Status:: Successful TOC FU Call Completed TOC FU Call Complete Date: 12/05/23 Patient's Name and Date of Birth confirmed.  Transition Care Management Follow-up Telephone Call Date of Discharge: 12/04/23 Discharge Facility: [facility]) Type of Discharge: Inpatient Admission Primary Inpatient Discharge Diagnosis:: Subarachnoid hemorrhage How have you been since you were released from the hospital?:  (pt reports " feeling better each day", eating well, bowel/ bladder wnl, taking meds as prescribed, using walker) Any questions or concerns?: No  Items Reviewed: Did you receive and understand the discharge instructions provided?: Yes Medications obtained,verified, and reconciled?: Yes (Medications Reviewed) Any new allergies since your discharge?: No Dietary orders reviewed?: Yes Type of Diet Ordered:: low sodium heart healthy, carb modified Do you have support at home?: Yes People in Home: spouse Name of Support/Comfort Primary Source: [place] Coins Reviewed discharge instructions Reviewed safety precautions  Medications Reviewed Today: Medications Reviewed Today     Reviewed by Audrie Gallus, RN (Registered Nurse) on 12/05/23 at 1249  Med List Status: <None>   Medication Order Taking? Sig Documenting Provider Last Dose Status Informant  Albuterol Sulfate (PROAIR RESPICLICK) 108 (90 Base) MCG/ACT AEPB [id number] Yes Inhale 2 puffs into the lungs every 6 (six) hours as needed. Raliegh Ip, DO Taking Active Spouse/Significant Other, Pharmacy Records  finasteride (PROSCAR) 5 MG tablet [id number] Yes Take 1 tablet (5 mg total) by mouth daily. Raliegh Ip, DO Taking Active Spouse/Significant Other, Pharmacy Records  Fluticasone-Umeclidin-Vilant [facility] ELLIPTA) 100-62.5-25 MCG/ACT AEPB [id number] Yes USE 1 INHALATION ORALLY    DAILY Delynn Flavin  M, DO Taking Active Spouse/Significant Other, Pharmacy Records  lisinopril (ZESTRIL) 10 MG tablet [id number] Yes Take 1 tablet (10 mg total) by mouth daily. Raliegh Ip, DO Taking Active Spouse/Significant Other, Pharmacy Records  pravastatin (PRAVACHOL) 40 MG tablet [id number] Yes Take 1 tablet (40 mg total) by mouth daily. Raliegh Ip, DO Taking Active Spouse/Significant Other, Pharmacy Records  Semaglutide,0.25 or 0.5MG /DOS, (OZEMPIC, 0.25 OR 0.5 MG/DOSE,) 2 MG/3ML SOPN [id number] Yes Inject 0.5 mg into the skin every 7 (seven) days. Raliegh Ip, DO Taking Active Spouse/Significant Other, Pharmacy Records  tamsulosin [facility]) 0.4 MG CAPS capsule [id number] Yes Take 1 capsule (0.4 mg total) by mouth 2 (two) times daily.  Patient taking differently: Take 0.4 mg by mouth daily.   Raliegh Ip, DO Taking Active Spouse/Significant Other, Pharmacy Records            Home Care and Equipment/Supplies: Were Home Health Services Ordered?: No Any new equipment or medical supplies ordered?: No  Functional Questionnaire: Do you need assistance with bathing/showering or dressing?: No Do you need assistance with meal preparation?: No Do you need assistance with eating?: No Do you have difficulty maintaining continence: No Do you need assistance with getting out of bed/getting out of a chair/moving?: Yes (uses walker) Do you have difficulty managing or taking your medications?: No  Follow up appointments reviewed: PCP Follow-up appointment confirmed?: No (spouse states following up with neurosurgeon she feels is sufficient) MD Provider Line Number:[phone] Given: No Specialist Hospital Follow-up appointment confirmed?:  (spouse states she will call today and make f/u neurosurgeon appt [facility]) Reason Specialist Follow-Up Not Confirmed: Patient has Specialist Provider Number and will Call for Appointment Do you need transportation to your follow-up  appointment?: No Do you understand care options if your condition(s) worsen?: Yes-patient verbalized understanding  SDOH Interventions Today    Flowsheet Row Most Recent Value  SDOH Interventions   Food Insecurity Interventions Intervention Not Indicated  Housing Interventions Intervention Not Indicated  Transportation Interventions Intervention Not Indicated  Utilities Interventions Intervention Not Indicated       Irving Shows [employer], BSN RN Care Manager/ Transition of Care [place]/ [facility] [phone]

## 2023-12-08 ENCOUNTER — Other Ambulatory Visit: Payer: Self-pay | Admitting: Family Medicine

## 2023-12-08 DIAGNOSIS — E1159 Type 2 diabetes mellitus with other circulatory complications: Secondary | ICD-10-CM

## 2023-12-09 ENCOUNTER — Encounter (INDEPENDENT_AMBULATORY_CARE_PROVIDER_SITE_OTHER): Payer: Medicare Other | Admitting: Family Medicine

## 2023-12-09 DIAGNOSIS — M545 Low back pain, unspecified: Secondary | ICD-10-CM

## 2023-12-09 DIAGNOSIS — I6203 Nontraumatic chronic subdural hemorrhage: Secondary | ICD-10-CM

## 2023-12-12 ENCOUNTER — Telehealth: Payer: Self-pay | Admitting: *Deleted

## 2023-12-12 NOTE — Telephone Encounter (Signed)
 Pt's wife states he has chronic back pain which was exacerbated by the fall and wants to know if there is anything other than tylenol  he can take. He is scheduled in Feb for injection in back, advised she can call his spine specialist to see if there is anything they can prescribe in the meantime and if not I can try to schedule him an appt with a provider here for evaluation but wife declines appt at this time and will call back if they would like appt.

## 2023-12-13 MED ORDER — LIDOCAINE 5 % EX PTCH: 1.0000 | MEDICATED_PATCH | CUTANEOUS | 0 refills | Status: DC

## 2023-12-13 NOTE — Telephone Encounter (Signed)

## 2023-12-14 ENCOUNTER — Encounter: Payer: Self-pay | Admitting: Family Medicine

## 2023-12-14 ENCOUNTER — Ambulatory Visit (INDEPENDENT_AMBULATORY_CARE_PROVIDER_SITE_OTHER): Payer: Medicare Other

## 2023-12-14 ENCOUNTER — Ambulatory Visit: Payer: Medicare Other

## 2023-12-14 VITALS — BP 127/74 | HR 91 | Temp 98.5°F | Ht 66.0 in | Wt 191.0 lb

## 2023-12-14 DIAGNOSIS — R059 Cough, unspecified: Secondary | ICD-10-CM | POA: Diagnosis not present

## 2023-12-14 DIAGNOSIS — R051 Acute cough: Secondary | ICD-10-CM

## 2023-12-14 DIAGNOSIS — S065XAA Traumatic subdural hemorrhage with loss of consciousness status unknown, initial encounter: Secondary | ICD-10-CM

## 2023-12-14 DIAGNOSIS — J441 Chronic obstructive pulmonary disease with (acute) exacerbation: Secondary | ICD-10-CM | POA: Diagnosis not present

## 2023-12-14 DIAGNOSIS — R918 Other nonspecific abnormal finding of lung field: Secondary | ICD-10-CM | POA: Diagnosis not present

## 2023-12-14 MED ORDER — PREDNISONE 20 MG PO TABS: ORAL_TABLET | ORAL | 0 refills | Status: DC

## 2023-12-14 MED ORDER — METHYLPREDNISOLONE ACETATE 40 MG/ML IJ SUSP: 40.0000 mg | Freq: Once | INTRAMUSCULAR | Status: DC

## 2023-12-14 MED ORDER — DOXYCYCLINE HYCLATE 100 MG PO TABS: 100.0000 mg | ORAL_TABLET | Freq: Two times a day (BID) | ORAL | 0 refills | Status: AC

## 2023-12-14 NOTE — Progress Notes (Signed)
 Subjective: CC: Follow-up subdural hematoma PCP: Jolinda Norene HERO, DO YEP:Brent Snow is a 83 y.o. male presenting to clinic today for:  1.  Subdural hematoma/subarachnoid hemorrhage Patient is brought to the office by his wife.  They presented today because he had some worsening back pain and a little bit more confusion earlier in the week but that since has resolved.  He continues to have some back pain and they are utilizing the Lidoderm  patches that were given with some success.  Utilizing Tylenol  but no Aleve  or ibuprofen.  Has an appointment with new neurologist on the 23rd of the month, Dr. Janit at [facility] in [place].  He seems to be moving around a little bit better and certainly mentation seems to be a bit clearer.  2.  Cough Patient is a had a wet sounding cough this been productive for about a month now.  He has been utilizing his albuterol  inhaler and nebulizer more.  He is compliant with Trelegy.  His wife notes that he had oxygenation dropping down into the 70s and 80s and she had him go back on his home oxygen  of 2 L and this brought his oxygen  up into the 90s.  They have not reached out to his pulmonologist for further instructions.   ROS: Per HPI  No Known Allergies Past Medical History:  Diagnosis Date   Arthritis    COPD, severe (HCC)    pulmologist-  dr byrum   Dyspnea on exertion    prescribed to use O2 via [place]--- per pt he does not use O2 he just take 1-2 puffs of rescue inhaler   Hiatal hernia    History of acute respiratory failure    02-18-2007  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   Hypertension    Hypoxemic respiratory failure, chronic (HCC)    pulmologist-  dr byrum-- prescribed supplemental O2 @ 2L via [place]    OSA on CPAP    and added O2 @ 2L via [place] w/ cpap   Phimosis    S/P balloon dilatation of esophageal stricture    multiple    Subdural hematoma (HCC)    in July '24 after fall   Type 2 diabetes mellitus (HCC)    followed  by pcp--  per note uncontrolled w/ last A1c >14 on 12-30-2016   Wears contact lenses     Current Outpatient Medications:    Albuterol  Sulfate (PROAIR  RESPICLICK) 108 (90 Base) MCG/ACT AEPB, Inhale 2 puffs into the lungs every 6 (six) hours as needed., Disp: 1 each, Rfl: 5   doxycycline  (VIBRA -TABS) 100 MG tablet, Take 1 tablet (100 mg total) by mouth 2 (two) times daily for 7 days., Disp: 14 tablet, Rfl: 0   finasteride  (PROSCAR ) 5 MG tablet, Take 1 tablet (5 mg total) by mouth daily., Disp: 90 tablet, Rfl: 3   Fluticasone -Umeclidin-Vilant (TRELEGY ELLIPTA ) 100-62.5-25 MCG/ACT AEPB, USE 1 INHALATION ORALLY    DAILY, Disp: 180 each, Rfl: 3   lidocaine  (LIDODERM ) 5 %, Place 1 patch onto the skin daily. Remove & Discard patch within 12 hours or as directed by MD, Disp: 30 patch, Rfl: 0   lisinopril  (ZESTRIL ) 10 MG tablet, Take 1 tablet (10 mg total) by mouth daily., Disp: 90 tablet, Rfl: 3   pravastatin  (PRAVACHOL ) 40 MG tablet, Take 1 tablet (40 mg total) by mouth daily., Disp: 90 tablet, Rfl: 3   predniSONE  (DELTASONE ) 20 MG tablet, 2 po at same time daily for 5 days,  Disp: 10 tablet, Rfl: 0   Semaglutide ,0.25 or 0.5MG /DOS, (OZEMPIC , 0.25 OR 0.5 MG/DOSE,) 2 MG/3ML SOPN, Inject 0.5 mg into the skin every 7 (seven) days., Disp: 9 mL, Rfl: 3   tamsulosin  (FLOMAX ) 0.4 MG CAPS capsule, Take 1 capsule (0.4 mg total) by mouth 2 (two) times daily. (Patient taking differently: Take 0.4 mg by mouth daily.), Disp: 180 capsule, Rfl: 3  Current Facility-Administered Medications:    methylPREDNISolone  acetate (DEPO-MEDROL ) injection 40 mg, 40 mg, Intramuscular, Once,  Social History   Socioeconomic History   Marital status: Married    Spouse name: Not on file   Number of children: 2   Years of education: 15   Highest education level: Some college, no degree  Occupational History   Occupation: Retired    Comment: Librarian, Academic  Tobacco Use   Smoking status: Former    Current  packs/day: 0.00    Average packs/day: 2.0 packs/day for 40.0 years (80.0 ttl pk-yrs)    Types: Cigarettes    Start date: 12/06/1966    Quit date: 12/06/2006    Years since quitting: 17.0   Smokeless tobacco: Never  Vaping Use   Vaping status: Never Used  Substance and Sexual Activity   Alcohol use: Yes    Alcohol/week: 0.0 standard drinks of alcohol    Comment: VERY LITTLE   Drug use: No   Sexual activity: Not on file  Other Topics Concern   Not on file  Social History Narrative   Not on file   Social Drivers of Health   Financial Resource Strain: Low Risk  (05/24/2023)   Received from [facility]   Overall Financial Resource Strain (CARDIA)    Difficulty of Paying Living Expenses: Not hard at all  Food Insecurity: No Food Insecurity (12/05/2023)   Hunger Vital Sign    Worried About Running Out of Food in the Last Year: Never true    Ran Out of Food in the Last Year: Never true  Transportation Needs: No Transportation Needs (12/05/2023)   PRAPARE - Administrator, Civil Service (Medical): No    Lack of Transportation (Non-Medical): No  Physical Activity: Insufficiently Active (12/09/2022)   Exercise Vital Sign    Days of Exercise per Week: 3 days    Minutes of Exercise per Session: 30 min  Stress: No Stress Concern Present (12/09/2022)   [employer] of Occupational Health - Occupational Stress Questionnaire    Feeling of Stress : Not at all  Social Connections: Socially Integrated (12/09/2022)   Social Connection and Isolation Panel [NHANES]    Frequency of Communication with Friends and Family: More than three times a week    Frequency of Social Gatherings with Friends and Family: More than three times a week    Attends Religious Services: More than 4 times per year    Active Member of [employer] or Organizations: Yes    Attends Engineer, Structural: More than 4 times per year    Marital Status: Married  Catering Manager Violence: Not At Risk (12/05/2023)    Humiliation, Afraid, Rape, and Kick questionnaire    Fear of Current or Ex-Partner: No    Emotionally Abused: No    Physically Abused: No    Sexually Abused: No   Family History  Problem Relation Age of Onset   Asthma Mother    COPD Mother    Cancer Maternal Grandmother        stomach   Early death Father 86  Tractor Accident    Objective: Office vital signs reviewed. BP 127/74   Pulse 91   Temp 98.5 F (36.9 C)   Ht 5' 6 (1.676 m)   Wt 191 lb (86.6 kg)   SpO2 91%   BMI 30.83 kg/m   Physical Examination:  General: Awake, alert, nontoxic male, No acute distress HEENT: sclera white Cardio: regular rate and rhythm, S1S2 heard, no murmurs appreciated Pulm: Globally decreased breath sounds.  A wet cough is appreciated during the exam.  He does not have any wheezes, rhonchi or rales noted.  He is breathing normally on room air MSK: Slightly unsteady gait.  Has an antalgic gait.  Able to walk largely independently but does utilize a railing for some stability intermittently  DG Chest 2 View Result Date: 12/14/2023 CLINICAL DATA:  cough, recent hospitalization for subdural EXAM: CHEST - 2 VIEW COMPARISON:  CXR 12/03/23 FINDINGS: Possible trace right pleural effusion. No pneumothorax. Unchanged cardiac and mediastinal contours. There is hazy opacity at the right lung base that is worrisome for infection. No radiographically apparent displaced rib fractures. Visualized upper abdomen is unremarkable. Vertebral body heights are maintained. IMPRESSION: 1. Hazy opacity at the right lung base is worrisome for infection. 2. Possible trace right pleural effusion. Electronically Signed   By: Lyndall Gore M.D.   On: 12/14/2023 10:09    Assessment/ Plan: 83 y.o. male   COPD exacerbation (HCC) - Plan: DG Chest 2 View, methylPREDNISolone  acetate (DEPO-MEDROL ) injection 40 mg, doxycycline  (VIBRA -TABS) 100 MG tablet, predniSONE  (DELTASONE ) 20 MG tablet  Subdural hematoma (HCC)  I am  going to treat him as a COPD exacerbation.  I think this is certainly supported given the radiologist review of the hazy opacity noted in the right lung base.  Doxycycline  sent into pharmacy.  Depo-Medrol  administered today and he will start oral corticosteroids tomorrow.  I will reach out to pulmonology as FYI.  I have asked his wife to continue monitoring pulse ox at home and if he continues to require home oxygen  I certainly want him seen by pulmonology sooner than later.  With regards to subdural hematoma, he seems to becoming more clear.  I was able to watch him walk today and I do think that he would benefit from some walking device like a walker or cane at least for now.  I did discuss that with him today.  Though he seems reluctant to utilize it.  Keep appoint with neuro as scheduled on 12/28/2022.  Of asked his wife to reach out to me if any questions or concerns arise prior to that time   Norene CHRISTELLA Fielding, DO [facility] [phone]

## 2023-12-23 ENCOUNTER — Ambulatory Visit (INDEPENDENT_AMBULATORY_CARE_PROVIDER_SITE_OTHER): Payer: Medicare Other

## 2023-12-23 VITALS — Ht 66.0 in | Wt 191.0 lb

## 2023-12-23 DIAGNOSIS — Z0000 Encounter for general adult medical examination without abnormal findings: Secondary | ICD-10-CM | POA: Diagnosis not present

## 2023-12-23 NOTE — Patient Instructions (Signed)
Mr. Belmont , Thank you for taking time to come for your Medicare Wellness Visit. I appreciate your ongoing commitment to your health goals. Please review the following plan we discussed and let me know if I can assist you in the future.   Referrals/Orders/Follow-Ups/Clinician Recommendations: Aim for 30 minutes of exercise or brisk walking, 6-8 glasses of water, and 5 servings of fruits and vegetables each day.  This is a list of the screening recommended for you and due dates:  Health Maintenance  Topic Date Due   COVID-19 Vaccine (3 - Mixed Product risk series) 12/30/2023*   DTaP/Tdap/Td vaccine (3 - Tdap) 11/15/2024*   Hepatitis C Screening  11/15/2024*   Yearly kidney health urinalysis for diabetes  05/16/2024   Complete foot exam   05/16/2024   Hemoglobin A1C  06/01/2024   Eye exam for diabetics  06/11/2024   Yearly kidney function blood test for diabetes  12/03/2024   Medicare Annual Wellness Visit  12/22/2024   Pneumonia Vaccine  Completed   Flu Shot  Completed   Zoster (Shingles) Vaccine  Completed   HPV Vaccine  Aged Out  *Topic was postponed. The date shown is not the original due date.    Advanced directives: (ACP Link)Information on Advanced Care Planning can be found at [facility] Advance Health Care Directives Advance Health Care Directives ([email or web address]   Next Medicare Annual Wellness Visit scheduled for next year: Yes

## 2023-12-23 NOTE — Progress Notes (Signed)
Subjective:   Brent Snow is a 83 y.o. male who presents for Medicare Annual/Subsequent preventive examination.  Visit Complete: Virtual I connected with  [facility] on 12/23/23 by a audio enabled telemedicine application and verified that I am speaking with the correct person using two identifiers.  Patient Location: Home  Provider Location: Home Office  This patient declined Interactive audio and video telecommunications. Therefore the visit was completed with audio only.  I discussed the limitations of evaluation and management by telemedicine. The patient expressed understanding and agreed to proceed.  Vital Signs: Because this visit was a virtual/telehealth visit, some criteria may be missing or patient reported. Any vitals not documented were not able to be obtained and vitals that have been documented are patient reported.  Cardiac Risk Factors include: advanced age (>10men, >67 women);diabetes mellitus;dyslipidemia;male gender;hypertension     Objective:    Today's Vitals   12/23/23 1047  Weight: 191 lb (86.6 kg)  Height: 5\' 6"  (1.676 m)   Body mass index is 30.83 kg/m.     12/23/2023    2:26 PM 12/03/2023    8:25 PM 12/02/2023    5:40 PM 12/09/2022   12:12 PM 10/26/2021    9:02 AM 06/18/2020    8:13 PM 05/21/2020    8:12 PM  Advanced Directives  Does Patient Have a Medical Advance Directive? No  No Yes No Yes Yes  Type of Theme park manager;Living will  Healthcare Power of [place];Living will Healthcare Power of [place];Living will  Does patient want to make changes to medical advance directive?      No - Patient declined No - Patient declined  Copy of Healthcare Power of Attorney in Chart?    No - copy requested  No - copy requested No - copy requested  Would patient like information on creating a medical advance directive? Yes (MAU/Ambulatory/Procedural Areas - Information given) No - Patient declined   No - Patient  declined      Current Medications (verified) Outpatient Encounter Medications as of 12/23/2023  Medication Sig   Albuterol Sulfate (PROAIR RESPICLICK) 108 (90 Base) MCG/ACT AEPB Inhale 2 puffs into the lungs every 6 (six) hours as needed.   finasteride (PROSCAR) 5 MG tablet Take 1 tablet (5 mg total) by mouth daily.   Fluticasone-Umeclidin-Vilant (TRELEGY ELLIPTA) 100-62.5-25 MCG/ACT AEPB USE 1 INHALATION ORALLY    DAILY   lidocaine (LIDODERM) 5 % Place 1 patch onto the skin daily. Remove & Discard patch within 12 hours or as directed by MD   lisinopril (ZESTRIL) 10 MG tablet Take 1 tablet (10 mg total) by mouth daily.   pravastatin (PRAVACHOL) 40 MG tablet Take 1 tablet (40 mg total) by mouth daily.   predniSONE (DELTASONE) 20 MG tablet 2 po at same time daily for 5 days   Semaglutide,0.25 or 0.5MG /DOS, (OZEMPIC, 0.25 OR 0.5 MG/DOSE,) 2 MG/3ML SOPN Inject 0.5 mg into the skin every 7 (seven) days.   tamsulosin (FLOMAX) 0.4 MG CAPS capsule Take 1 capsule (0.4 mg total) by mouth 2 (two) times daily. (Patient taking differently: Take 0.4 mg by mouth daily.)   Facility-Administered Encounter Medications as of 12/23/2023  Medication   methylPREDNISolone acetate (DEPO-MEDROL) injection 40 mg    Allergies (verified) Patient has no known allergies.   History: Past Medical History:  Diagnosis Date   Arthritis    COPD, severe (HCC)    pulmologist-  dr byrum   Dyspnea on exertion  prescribed to use O2 via [place]--- per pt he does not use O2 he just take 1-2 puffs of rescue inhaler   Hiatal hernia    History of acute respiratory failure    02-18-2007  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   Hypertension    Hypoxemic respiratory failure, chronic (HCC)    pulmologist-  dr byrum-- prescribed supplemental O2 @ 2L via [place]    OSA on CPAP    and added O2 @ 2L via [place] w/ cpap   Phimosis    S/P balloon dilatation of esophageal stricture    multiple    Subdural hematoma (HCC)    in July '24  after fall   Type 2 diabetes mellitus (HCC)    followed by pcp--  per note uncontrolled w/ last A1c >14 on 12-30-2016   Wears contact lenses    Past Surgical History:  Procedure Laterality Date   BACK SURGERY     CIRCUMCISION N/A 01/27/2017   Procedure: CIRCUMCISION ADULT;  Surgeon: Marcine Matar, MD;  [facility]: [facility];  Service: Urology;  Laterality: N/A;   KNEE ARTHROSCOPY Right 2008   LUMBAR SPINE SURGERY  1977  approx.   TONSILLECTOMY AND ADENOIDECTOMY  age 58   TRANSTHORACIC ECHOCARDIOGRAM  09/12/2015   mild LVH,  ef 65-70%,  grade 1 diastolic dysfunction/  mild RAE   Family History  Problem Relation Age of Onset   Asthma Mother    COPD Mother    Cancer Maternal Grandmother        stomach   Early death Father 50       Tractor Accident   Social History   Socioeconomic History   Marital status: Married    Spouse name: Not on file   Number of children: 2   Years of education: 15   Highest education level: Some college, no degree  Occupational History   Occupation: Retired    Comment: Librarian, academic  Tobacco Use   Smoking status: Former    Current packs/day: 0.00    Average packs/day: 2.0 packs/day for 40.0 years (80.0 ttl pk-yrs)    Types: Cigarettes    Start date: 12/06/1966    Quit date: 12/06/2006    Years since quitting: 17.0   Smokeless tobacco: Never  Vaping Use   Vaping status: Never Used  Substance and Sexual Activity   Alcohol use: Yes    Alcohol/week: 0.0 standard drinks of alcohol    Comment: VERY LITTLE   Drug use: No   Sexual activity: Not on file  Other Topics Concern   Not on file  Social History Narrative   Not on file   Social Drivers of Health   Financial Resource Strain: Low Risk  (12/23/2023)   Overall Financial Resource Strain (CARDIA)    Difficulty of Paying Living Expenses: Not hard at all  Food Insecurity: No Food Insecurity (12/23/2023)   Hunger Vital Sign    Worried About Running Out of  Food in the Last Year: Never true    Ran Out of Food in the Last Year: Never true  Transportation Needs: No Transportation Needs (12/23/2023)   PRAPARE - Administrator, Civil Service (Medical): No    Lack of Transportation (Non-Medical): No  Physical Activity: Insufficiently Active (12/23/2023)   Exercise Vital Sign    Days of Exercise per Week: 2 days    Minutes of Exercise per Session: 20 min  Stress: No Stress Concern Present (  12/23/2023)   [employer] [employer] - Occupational Stress Questionnaire    Feeling of Stress : Not at all  Social Connections: Socially Integrated (12/23/2023)   Social Connection and Isolation Panel [NHANES]    Frequency of Communication with Friends and Family: More than three times a week    Frequency of Social Gatherings with Friends and Family: Three times a week    Attends Religious Services: More than 4 times per year    Active Member of Clubs or Organizations: Yes    Attends Engineer, structural: More than 4 times per year    Marital Status: Married    Tobacco Counseling Counseling given: Not Answered   Clinical Intake:  Pre-visit preparation completed: Yes  Pain : No/denies pain     Diabetes: No  How often do you need to have someone help you when you read instructions, pamphlets, or other written materials from your doctor or pharmacy?: 1 - Never  Interpreter Needed?: No  Information entered by :: Kandis Fantasia LPN   Activities of Daily Living    12/23/2023    2:23 PM 12/03/2023    8:14 PM  In your present state of health, do you have any difficulty performing the following activities:  Hearing? 0 0  Vision? 0 0  Difficulty concentrating or making decisions? 0 0  Walking or climbing stairs? 1   Dressing or bathing? 0   Doing errands, shopping? 1   Preparing Food and eating ? N   Using the Toilet? N   In the past six months, have you accidently leaked urine? N   Do you have problems with  loss of bowel control? N   Managing your Medications? N   Managing your Finances? N   Housekeeping or managing your Housekeeping? N     Patient Care Team: Raliegh Ip, DO as PCP - General (Family Medicine) Leslye Peer, MD as Consulting Physician (Pulmonary Disease) Sheran Luz, MD as Consulting Physician (Physical Medicine and Rehabilitation) Tacey Ruiz (Inactive) Marcine Matar, MD as Consulting Physician (Urology) Everlena Cooper, MD as Referring Physician (Neurology)  Indicate any recent Medical Services you may have received from other than [facility] providers in the past year (date may be approximate).     Assessment:   This is a routine wellness examination for Elius.  Hearing/Vision screen Hearing Screening - Comments:: Denies hearing difficulties   Vision Screening - Comments:: Wears rx glasses - up to date with routine eye exams with Dr. Conley Rolls     Goals Addressed   None   Depression Screen    12/23/2023    2:24 PM 11/16/2023    9:32 AM 05/17/2023    2:28 PM 12/09/2022   12:11 PM 09/01/2022    1:07 PM 01/06/2022   10:18 AM 12/14/2021    2:46 PM  PHQ 2/9 Scores  PHQ - 2 Score 0 0 0 0 0 0 0  PHQ- 9 Score 0 0 0   3 6    Fall Risk    12/23/2023    2:26 PM 11/16/2023    9:39 AM 11/16/2023    9:32 AM 05/17/2023    2:28 PM 12/09/2022   12:10 PM  Fall Risk   Falls in the past year? 1 1 0 0 0  Number falls in past yr: 0 0 0 0 0  Injury with Fall? 1 0 0 0 0  Risk for fall due to : History of fall(s);Impaired balance/gait;Impaired mobility Impaired mobility No  Fall Risks  No Fall Risks  Follow up Education provided;Falls prevention discussed;Falls evaluation completed Education provided Education provided Education provided Falls prevention discussed    MEDICARE RISK AT HOME: Medicare Risk at Home Any stairs in or around the home?: No If so, are there any without handrails?: No Home free of loose throw rugs in walkways, pet beds, electrical cords, etc?:  Yes Adequate lighting in your home to reduce risk of falls?: Yes Life alert?: No Use of a cane, walker or w/c?: Yes Grab bars in the bathroom?: Yes Shower chair or bench in shower?: No Elevated toilet seat or a handicapped toilet?: Yes  TIMED UP AND GO:  Was the test performed?  No    Cognitive Function:        12/23/2023    2:27 PM 12/09/2022   12:13 PM 10/26/2021    9:07 AM 05/07/2019   10:33 AM  6CIT Screen  What Year? 0 points 0 points 0 points 0 points  What month? 0 points 0 points 0 points 0 points  What time? 0 points 0 points 0 points 0 points  Count back from 20 0 points 0 points 0 points 0 points  Months in reverse 0 points 0 points 0 points 0 points  Repeat phrase 0 points 0 points 2 points 2 points  Total Score 0 points 0 points 2 points 2 points    Immunizations Immunization History  Administered Date(s) Administered   Fluad Quad(high Dose 65+) 10/25/2019, 09/01/2020, 11/04/2021, 09/01/2022   Fluad Trivalent(High Dose 65+) 11/16/2023   Influenza, High Dose Seasonal PF 10/18/2018   Influenza,inj,quad, With Preservative 10/02/2018   Moderna Sars-Covid-2 Vaccination 12/18/2019   PFIZER(Purple Top)SARS-COV-2 Vaccination 02/15/2020   Pneumococcal Conjugate-13 10/24/2013   Pneumococcal Polysaccharide-23 12/06/2006, 10/11/2012, 11/03/2016   Td 05/17/2006   Td (Adult),5 Lf Tetanus Toxid, Preservative Free 05/17/2006   Zoster Recombinant(Shingrix) 07/06/2021, 01/06/2022    TDAP status: Due, Education has been provided regarding the importance of this vaccine. Advised may receive this vaccine at local pharmacy or Health Dept. Aware to provide a copy of the vaccination record if obtained from local pharmacy or Health Dept. Verbalized acceptance and understanding.  Flu Vaccine status: Up to date  Pneumococcal vaccine status: Up to date  Covid-19 vaccine status: Information provided on how to obtain vaccines.   Qualifies for Shingles Vaccine? Yes   Zostavax  completed No   Shingrix Completed?: Yes  Screening Tests Health Maintenance  Topic Date Due   COVID-19 Vaccine (3 - Mixed Product risk series) 12/30/2023 (Originally 03/14/2020)   DTaP/Tdap/Td (3 - Tdap) 11/15/2024 (Originally 05/17/2016)   Hepatitis C Screening  11/15/2024 (Originally 11/16/2023)   Diabetic kidney evaluation - Urine ACR  05/16/2024   FOOT EXAM  05/16/2024   HEMOGLOBIN A1C  06/01/2024   OPHTHALMOLOGY EXAM  06/11/2024   Diabetic kidney evaluation - eGFR measurement  12/03/2024   Medicare Annual Wellness (AWV)  12/22/2024   Pneumonia Vaccine 16+ Years old  Completed   INFLUENZA VACCINE  Completed   Zoster Vaccines- Shingrix  Completed   HPV VACCINES  Aged Out    Health Maintenance  There are no preventive care reminders to display for this patient.  Colorectal cancer screening: No longer required.   Lung Cancer Screening: (Low Dose CT Chest recommended if Age 61-80 years, 20 pack-year currently smoking OR have quit w/in 15years.) does not qualify.   Lung Cancer Screening Referral: n/a  Additional Screening:  Hepatitis C Screening: does not qualify  Vision Screening: Recommended  annual ophthalmology exams for early detection of glaucoma and other disorders of the eye. Is the patient up to date with their annual eye exam?  Yes  Who is the provider or what is the name of the office in which the patient attends annual eye exams? Dr. Conley Rolls  If pt is not established with a provider, would they like to be referred to a provider to establish care? No .   Dental Screening: Recommended annual dental exams for proper oral hygiene  Diabetic Foot Exam: Diabetic Foot Exam: Completed 05/17/23  Community Resource Referral / Chronic Care Management: CRR required this visit?  No   CCM required this visit?  No     Plan:     I have personally reviewed and noted the following in the patient's chart:   Medical and social history Use of alcohol, tobacco or illicit drugs   Current medications and supplements including opioid prescriptions. Patient is not currently taking opioid prescriptions. Functional ability and status Nutritional status Physical activity Advanced directives List of other physicians Hospitalizations, surgeries, and ER visits in previous 12 months Vitals Screenings to include cognitive, depression, and falls Referrals and appointments  In addition, I have reviewed and discussed with patient certain preventive protocols, quality metrics, and best practice recommendations. A written personalized care plan for preventive services as well as general preventive health recommendations were provided to patient.     Kandis Fantasia Seth [place], [place]   6/96/2952   After Visit Summary: (MyChart) Due to this being a telephonic visit, the after visit summary with patients personalized plan was offered to patient via MyChart   Nurse Notes: No concerns at this time

## 2023-12-28 ENCOUNTER — Other Ambulatory Visit: Payer: Self-pay | Admitting: Family Medicine

## 2023-12-28 DIAGNOSIS — E1159 Type 2 diabetes mellitus with other circulatory complications: Secondary | ICD-10-CM

## 2023-12-28 DIAGNOSIS — E1169 Type 2 diabetes mellitus with other specified complication: Secondary | ICD-10-CM

## 2023-12-29 DIAGNOSIS — S066XAS Traumatic subarachnoid hemorrhage with loss of consciousness status unknown, sequela: Secondary | ICD-10-CM | POA: Diagnosis not present

## 2023-12-29 DIAGNOSIS — S069XAS Unspecified intracranial injury with loss of consciousness status unknown, sequela: Secondary | ICD-10-CM | POA: Diagnosis not present

## 2023-12-29 DIAGNOSIS — Z1330 Encounter for screening examination for mental health and behavioral disorders, unspecified: Secondary | ICD-10-CM | POA: Diagnosis not present

## 2023-12-29 DIAGNOSIS — R270 Ataxia, unspecified: Secondary | ICD-10-CM | POA: Diagnosis not present

## 2024-01-09 DIAGNOSIS — S066X0D Traumatic subarachnoid hemorrhage without loss of consciousness, subsequent encounter: Secondary | ICD-10-CM | POA: Diagnosis not present

## 2024-01-09 DIAGNOSIS — S069XAA Unspecified intracranial injury with loss of consciousness status unknown, initial encounter: Secondary | ICD-10-CM | POA: Diagnosis not present

## 2024-01-13 DIAGNOSIS — S069XAS Unspecified intracranial injury with loss of consciousness status unknown, sequela: Secondary | ICD-10-CM | POA: Diagnosis not present

## 2024-01-13 DIAGNOSIS — R270 Ataxia, unspecified: Secondary | ICD-10-CM | POA: Diagnosis not present

## 2024-01-16 ENCOUNTER — Other Ambulatory Visit: Payer: Self-pay | Admitting: Family Medicine

## 2024-01-16 DIAGNOSIS — I6203 Nontraumatic chronic subdural hemorrhage: Secondary | ICD-10-CM

## 2024-01-16 DIAGNOSIS — M545 Low back pain, unspecified: Secondary | ICD-10-CM

## 2024-01-25 DIAGNOSIS — M51362 Other intervertebral disc degeneration, lumbar region with discogenic back pain and lower extremity pain: Secondary | ICD-10-CM | POA: Diagnosis not present

## 2024-01-25 DIAGNOSIS — Z9889 Other specified postprocedural states: Secondary | ICD-10-CM | POA: Diagnosis not present

## 2024-01-25 DIAGNOSIS — M5416 Radiculopathy, lumbar region: Secondary | ICD-10-CM | POA: Diagnosis not present

## 2024-02-27 ENCOUNTER — Observation Stay (HOSPITAL_COMMUNITY)
Admission: EM | Admit: 2024-02-27 | Discharge: 2024-02-28 | Disposition: A | Discharge status: Home / Self Care | Attending: Internal Medicine | Admitting: Internal Medicine

## 2024-02-27 ENCOUNTER — Emergency Department (HOSPITAL_COMMUNITY)

## 2024-02-27 ENCOUNTER — Other Ambulatory Visit: Payer: Self-pay

## 2024-02-27 ENCOUNTER — Inpatient Hospital Stay (HOSPITAL_COMMUNITY)

## 2024-02-27 ENCOUNTER — Encounter (HOSPITAL_COMMUNITY): Payer: Self-pay | Admitting: *Deleted

## 2024-02-27 DIAGNOSIS — Z87891 Personal history of nicotine dependence: Secondary | ICD-10-CM | POA: Diagnosis not present

## 2024-02-27 DIAGNOSIS — R059 Cough, unspecified: Secondary | ICD-10-CM | POA: Diagnosis not present

## 2024-02-27 DIAGNOSIS — E119 Type 2 diabetes mellitus without complications: Secondary | ICD-10-CM

## 2024-02-27 DIAGNOSIS — R6520 Severe sepsis without septic shock: Secondary | ICD-10-CM | POA: Insufficient documentation

## 2024-02-27 DIAGNOSIS — R0902 Hypoxemia: Secondary | ICD-10-CM | POA: Diagnosis not present

## 2024-02-27 DIAGNOSIS — E1165 Type 2 diabetes mellitus with hyperglycemia: Secondary | ICD-10-CM | POA: Insufficient documentation

## 2024-02-27 DIAGNOSIS — J9622 Acute and chronic respiratory failure with hypercapnia: Secondary | ICD-10-CM

## 2024-02-27 DIAGNOSIS — A419 Sepsis, unspecified organism: Principal | ICD-10-CM | POA: Diagnosis present

## 2024-02-27 DIAGNOSIS — J9621 Acute and chronic respiratory failure with hypoxia: Secondary | ICD-10-CM | POA: Insufficient documentation

## 2024-02-27 DIAGNOSIS — R0602 Shortness of breath: Secondary | ICD-10-CM | POA: Diagnosis not present

## 2024-02-27 DIAGNOSIS — J9601 Acute respiratory failure with hypoxia: Principal | ICD-10-CM

## 2024-02-27 DIAGNOSIS — J441 Chronic obstructive pulmonary disease with (acute) exacerbation: Secondary | ICD-10-CM | POA: Insufficient documentation

## 2024-02-27 DIAGNOSIS — Z79899 Other long term (current) drug therapy: Secondary | ICD-10-CM | POA: Diagnosis not present

## 2024-02-27 DIAGNOSIS — E782 Mixed hyperlipidemia: Secondary | ICD-10-CM | POA: Insufficient documentation

## 2024-02-27 DIAGNOSIS — R0989 Other specified symptoms and signs involving the circulatory and respiratory systems: Secondary | ICD-10-CM | POA: Diagnosis not present

## 2024-02-27 DIAGNOSIS — I10 Essential (primary) hypertension: Secondary | ICD-10-CM | POA: Diagnosis not present

## 2024-02-27 DIAGNOSIS — F1090 Alcohol use, unspecified, uncomplicated: Secondary | ICD-10-CM | POA: Diagnosis not present

## 2024-02-27 DIAGNOSIS — J181 Lobar pneumonia, unspecified organism: Secondary | ICD-10-CM | POA: Insufficient documentation

## 2024-02-27 DIAGNOSIS — Z1152 Encounter for screening for COVID-19: Secondary | ICD-10-CM | POA: Diagnosis not present

## 2024-02-27 DIAGNOSIS — R918 Other nonspecific abnormal finding of lung field: Secondary | ICD-10-CM | POA: Diagnosis not present

## 2024-02-27 DIAGNOSIS — N400 Enlarged prostate without lower urinary tract symptoms: Secondary | ICD-10-CM | POA: Insufficient documentation

## 2024-02-27 DIAGNOSIS — Z794 Long term (current) use of insulin: Secondary | ICD-10-CM | POA: Diagnosis not present

## 2024-02-27 DIAGNOSIS — R000 Tachycardia, unspecified: Secondary | ICD-10-CM | POA: Diagnosis not present

## 2024-02-27 HISTORY — DX: Lobar pneumonia, unspecified organism: J18.1

## 2024-02-27 HISTORY — DX: Sepsis, unspecified organism: A41.9

## 2024-02-27 LAB — CBC WITH DIFFERENTIAL/PLATELET
Abs Immature Granulocytes: 0.03 10*3/uL (ref 0.00–0.07)
Basophils Absolute: 0 10*3/uL (ref 0.0–0.1)
Basophils Relative: 0 %
Eosinophils Absolute: 0 10*3/uL (ref 0.0–0.5)
Eosinophils Relative: 0 %
HCT: 48.3 % (ref 39.0–52.0)
Hemoglobin: 15.4 g/dL (ref 13.0–17.0)
Immature Granulocytes: 0 %
Lymphocytes Relative: 3 %
Lymphs Abs: 0.2 10*3/uL — ABNORMAL LOW (ref 0.7–4.0)
MCH: 30.8 pg (ref 26.0–34.0)
MCHC: 31.9 g/dL (ref 30.0–36.0)
MCV: 96.6 fL (ref 80.0–100.0)
Monocytes Absolute: 0.5 10*3/uL (ref 0.1–1.0)
Monocytes Relative: 6 %
Neutro Abs: 7.6 10*3/uL (ref 1.7–7.7)
Neutrophils Relative %: 91 %
Platelets: 168 10*3/uL (ref 150–400)
RBC: 5 MIL/uL (ref 4.22–5.81)
RDW: 13.7 % (ref 11.5–15.5)
WBC: 8.4 10*3/uL (ref 4.0–10.5)
nRBC: 0 % (ref 0.0–0.2)

## 2024-02-27 LAB — RESPIRATORY PANEL BY PCR

## 2024-02-27 LAB — COMPREHENSIVE METABOLIC PANEL
ALT: 14 U/L (ref 0–44)
AST: 17 U/L (ref 15–41)
Albumin: 3.3 g/dL — ABNORMAL LOW (ref 3.5–5.0)
Alkaline Phosphatase: 65 U/L (ref 38–126)
Anion gap: 11 (ref 5–15)
BUN: 21 mg/dL (ref 8–23)
CO2: 24 mmol/L (ref 22–32)
Calcium: 8.8 mg/dL — ABNORMAL LOW (ref 8.9–10.3)
Chloride: 102 mmol/L (ref 98–111)
Creatinine, Ser: 1.02 mg/dL (ref 0.61–1.24)
GFR, Estimated: >60 mL/min (ref >60)
Glucose, Bld: 208 mg/dL — ABNORMAL HIGH (ref 70–99)
Potassium: 4.2 mmol/L (ref 3.5–5.1)
Sodium: 137 mmol/L (ref 135–145)
Total Bilirubin: 0.9 mg/dL (ref 0.0–1.2)
Total Protein: 6.5 g/dL (ref 6.5–8.1)

## 2024-02-27 LAB — HEMOGLOBIN A1C
Hgb A1c MFr Bld: 7.2 % — ABNORMAL HIGH (ref 4.8–5.6)
Mean Plasma Glucose: 159.94 mg/dL

## 2024-02-27 LAB — BLOOD GAS, VENOUS
Acid-base deficit: 0.3 mmol/L (ref 0.0–2.0)
Bicarbonate: 27.4 mmol/L (ref 20.0–28.0)
Drawn by: 44828
O2 Saturation: 65.6 %
Patient temperature: 36.9
pCO2, Ven: 57 mmHg (ref 44–60)
pH, Ven: 7.29 (ref 7.25–7.43)
pO2, Ven: 37 mmHg (ref 32–45)

## 2024-02-27 LAB — APTT: aPTT: 33 s (ref 24–36)

## 2024-02-27 LAB — PROTIME-INR
INR: 1.1 (ref 0.8–1.2)
Prothrombin Time: 14 s (ref 11.4–15.2)

## 2024-02-27 LAB — LACTIC ACID, PLASMA
Lactic Acid, Venous: 1.3 mmol/L (ref 0.5–1.9)
Lactic Acid, Venous: 1.7 mmol/L (ref 0.5–1.9)

## 2024-02-27 LAB — URINALYSIS, W/ REFLEX TO CULTURE (INFECTION SUSPECTED)
Bilirubin Urine: NEGATIVE
Glucose, UA: >=500 mg/dL — AB
Hgb urine dipstick: NEGATIVE
Ketones, ur: 20 mg/dL — AB
Nitrite: NEGATIVE
Protein, ur: 30 mg/dL — AB
Specific Gravity, Urine: 1.027 (ref 1.005–1.030)
pH: 5 (ref 5.0–8.0)

## 2024-02-27 LAB — RESP PANEL BY RT-PCR (RSV, FLU A&B, COVID)  RVPGX2
Influenza A by PCR: NEGATIVE
Influenza B by PCR: NEGATIVE
Resp Syncytial Virus by PCR: NEGATIVE
SARS Coronavirus 2 by RT PCR: NEGATIVE

## 2024-02-27 LAB — GLUCOSE, CAPILLARY
Glucose-Capillary: 197 mg/dL — ABNORMAL HIGH (ref 70–99)
Glucose-Capillary: 291 mg/dL — ABNORMAL HIGH (ref 70–99)

## 2024-02-27 LAB — MRSA NEXT GEN BY PCR, NASAL: MRSA by PCR Next Gen: NOT DETECTED

## 2024-02-27 LAB — PROCALCITONIN: Procalcitonin: 1.37 ng/mL

## 2024-02-27 LAB — BRAIN NATRIURETIC PEPTIDE: B Natriuretic Peptide: 97 pg/mL (ref 0.0–100.0)

## 2024-02-27 MED ORDER — TAMSULOSIN HCL 0.4 MG PO CAPS
0.4000 mg | ORAL_CAPSULE | Freq: Every day | ORAL | Status: DC
  Administered 2024-02-27 – 2024-02-28 (×2): 0.4 mg via ORAL
  Filled 2024-02-27 (×2): qty 1

## 2024-02-27 MED ORDER — ACETAMINOPHEN 325 MG PO TABS: 650.0000 mg | ORAL_TABLET | Freq: Four times a day (QID) | ORAL | Status: DC | PRN

## 2024-02-27 MED ORDER — CHLORHEXIDINE GLUCONATE CLOTH 2 % EX PADS
6.0000 | MEDICATED_PAD | Freq: Every day | CUTANEOUS | Status: DC
  Administered 2024-02-27 – 2024-02-28 (×2): 6 via TOPICAL

## 2024-02-27 MED ORDER — LACTATED RINGERS IV BOLUS
500.0000 mL | Freq: Once | INTRAVENOUS | Status: AC
  Administered 2024-02-27: 500 mL via INTRAVENOUS

## 2024-02-27 MED ORDER — AZITHROMYCIN 250 MG PO TABS
500.0000 mg | ORAL_TABLET | Freq: Every day | ORAL | Status: DC
  Administered 2024-02-27 – 2024-02-28 (×2): 500 mg via ORAL
  Filled 2024-02-27 (×2): qty 2

## 2024-02-27 MED ORDER — PRAVASTATIN SODIUM 40 MG PO TABS
40.0000 mg | ORAL_TABLET | Freq: Every day | ORAL | Status: DC
  Administered 2024-02-28: 40 mg via ORAL
  Filled 2024-02-27: qty 1

## 2024-02-27 MED ORDER — BUDESONIDE 0.5 MG/2ML IN SUSP
0.5000 mg | Freq: Two times a day (BID) | RESPIRATORY_TRACT | Status: DC
  Administered 2024-02-27 – 2024-02-28 (×2): 0.5 mg via RESPIRATORY_TRACT
  Filled 2024-02-27 (×2): qty 2

## 2024-02-27 MED ORDER — IPRATROPIUM-ALBUTEROL 0.5-2.5 (3) MG/3ML IN SOLN
3.0000 mL | Freq: Once | RESPIRATORY_TRACT | Status: AC
  Administered 2024-02-27: 3 mL via RESPIRATORY_TRACT

## 2024-02-27 MED ORDER — LACTATED RINGERS IV BOLUS (SEPSIS)
1000.0000 mL | Freq: Once | INTRAVENOUS | Status: AC
  Administered 2024-02-27: 1000 mL via INTRAVENOUS

## 2024-02-27 MED ORDER — INSULIN ASPART 100 UNIT/ML IJ SOLN
0.0000 [IU] | Freq: Three times a day (TID) | INTRAMUSCULAR | Status: DC
  Administered 2024-02-27 – 2024-02-28 (×2): 2 [IU] via SUBCUTANEOUS
  Administered 2024-02-28: 5 [IU] via SUBCUTANEOUS

## 2024-02-27 MED ORDER — SODIUM CHLORIDE 0.9 % IV BOLUS
1000.0000 mL | Freq: Once | INTRAVENOUS | Status: AC
  Administered 2024-02-27: 1000 mL via INTRAVENOUS

## 2024-02-27 MED ORDER — ACETAMINOPHEN 650 MG RE SUPP: 650.0000 mg | Freq: Four times a day (QID) | RECTAL | Status: DC | PRN

## 2024-02-27 MED ORDER — METHYLPREDNISOLONE SODIUM SUCC 125 MG IJ SOLR
60.0000 mg | Freq: Two times a day (BID) | INTRAMUSCULAR | Status: DC
  Administered 2024-02-27 – 2024-02-28 (×2): 60 mg via INTRAVENOUS
  Filled 2024-02-27 (×2): qty 2

## 2024-02-27 MED ORDER — ONDANSETRON HCL 4 MG PO TABS: 4.0000 mg | ORAL_TABLET | Freq: Four times a day (QID) | ORAL | Status: DC | PRN

## 2024-02-27 MED ORDER — ONDANSETRON HCL 4 MG/2ML IJ SOLN: 4.0000 mg | Freq: Four times a day (QID) | INTRAMUSCULAR | Status: DC | PRN

## 2024-02-27 MED ORDER — IPRATROPIUM-ALBUTEROL 0.5-2.5 (3) MG/3ML IN SOLN
3.0000 mL | Freq: Four times a day (QID) | RESPIRATORY_TRACT | Status: DC
  Administered 2024-02-27 – 2024-02-28 (×4): 3 mL via RESPIRATORY_TRACT
  Filled 2024-02-27 (×5): qty 3

## 2024-02-27 MED ORDER — ACETAMINOPHEN 500 MG PO TABS
1000.0000 mg | ORAL_TABLET | Freq: Once | ORAL | Status: AC
  Administered 2024-02-27: 1000 mg via ORAL
  Filled 2024-02-27: qty 2

## 2024-02-27 MED ORDER — LACTATED RINGERS IV SOLN: INTRAVENOUS | Status: AC

## 2024-02-27 MED ORDER — SODIUM CHLORIDE 0.9 % IV SOLN
2.0000 g | INTRAVENOUS | Status: DC
  Administered 2024-02-27: 2 g via INTRAVENOUS
  Filled 2024-02-27: qty 20

## 2024-02-27 MED ORDER — ARFORMOTEROL TARTRATE 15 MCG/2ML IN NEBU
15.0000 ug | INHALATION_SOLUTION | Freq: Two times a day (BID) | RESPIRATORY_TRACT | Status: DC
  Administered 2024-02-27 – 2024-02-28 (×2): 15 ug via RESPIRATORY_TRACT
  Filled 2024-02-27 (×2): qty 2

## 2024-02-27 MED ORDER — VANCOMYCIN HCL 2000 MG/400ML IV SOLN
2000.0000 mg | Freq: Once | INTRAVENOUS | Status: AC
  Administered 2024-02-27: 2000 mg via INTRAVENOUS
  Filled 2024-02-27: qty 400

## 2024-02-27 MED ORDER — VANCOMYCIN HCL IN DEXTROSE 1-5 GM/200ML-% IV SOLN: 1000.0000 mg | Freq: Once | INTRAVENOUS | Status: DC

## 2024-02-27 MED ORDER — IPRATROPIUM-ALBUTEROL 0.5-2.5 (3) MG/3ML IN SOLN
RESPIRATORY_TRACT | Status: AC
  Filled 2024-02-27: qty 3

## 2024-02-27 MED ORDER — SODIUM CHLORIDE 0.9 % IV SOLN
2.0000 g | Freq: Once | INTRAVENOUS | Status: AC
  Administered 2024-02-27: 2 g via INTRAVENOUS
  Filled 2024-02-27: qty 12.5

## 2024-02-27 MED ORDER — FINASTERIDE 5 MG PO TABS
5.0000 mg | ORAL_TABLET | Freq: Every day | ORAL | Status: DC
  Administered 2024-02-27 – 2024-02-28 (×2): 5 mg via ORAL
  Filled 2024-02-27 (×2): qty 1

## 2024-02-27 MED ORDER — INSULIN ASPART 100 UNIT/ML IJ SOLN: 0.0000 [IU] | Freq: Every day | INTRAMUSCULAR | Status: DC

## 2024-02-27 MED ORDER — ENOXAPARIN SODIUM 40 MG/0.4ML IJ SOSY
40.0000 mg | PREFILLED_SYRINGE | INTRAMUSCULAR | Status: DC
  Administered 2024-02-27: 40 mg via SUBCUTANEOUS
  Filled 2024-02-27: qty 0.4

## 2024-02-27 NOTE — Progress Notes (Signed)
   02/27/24 2012  TOC Brief Assessment  Insurance and Status Reviewed  Patient has primary care physician Yes  Home environment has been reviewed From home.  Prior level of function: Independent.  Prior/Current Home Services No current home services  Social Drivers of Health Review SDOH reviewed no interventions necessary  Readmission risk has been reviewed Yes  Transition of care needs no transition of care needs at this time   Transition of Care Department [facility] has reviewed patient and no other TOC needs have been identified at this time. We will continue to monitor patient advancement through interdisciplinary progression rounds. If new patient transition needs arise, please place a TOC consult.

## 2024-02-27 NOTE — Plan of Care (Signed)

## 2024-02-27 NOTE — Hospital Course (Addendum)
 83 year old male with a history of COPD, chronic respiratory failure on 2 L as needed, diabetes mellitus type 2, hypertension, hyperlipidemia, SDH 06/2023, BPH presenting with increased shortness of breath and congestion since choking on a piece of steak on 02/26/2024.  The patient however has noticed some increasing dyspnea with his daily activities in the 2 to 3 days prior to this admission.  However, had denied any worsening cough, hemoptysis, fevers, chills, chest pain, nausea, vomiting, diarrhea.  Patient states that he quit smoking in 2008 after smoking over 100 pack years. The patient denies any worsening lower extremity edema or orthopnea.  He denies any hematochezia or melena. Because of worsening shortness of breath over the last 24 hours, he presented for the evaluation and treatment.  Upon EMS arrival, the patient was noted to have some distress with oxygen saturation 84% room air.  He was given Solu-Medrol IV x 1. In the ED, the patient was febrile up to 101.3 F and tachycardic 110-120 and tachypneic.  He was hemodynamically stable.  Oxygen saturation is 96% on 2 L.  WBC 8.4, hemoglobin 15.4, platelets 160.  Sodium 137, potassium 4.2, bicarbonate 24, serum creatinine 1.02.  AST 17, ALT 14, Gonfa today's 65, total bilirubin 0.9, albumin 3.3.  EKG showed sinus tachycardia with nonspecific T wave changes.  COVID-19 PCR is negative.  Lactic acid 1.3>> 1.7.  The patient was given vancomycin and cefepime.  He was given bronchodilators.  Chest x-ray showed heterogenous alveolar and interstitial opacities over the right lung.

## 2024-02-27 NOTE — ED Notes (Signed)
 Warm blankets provided. Family at bedside. Call light within reach.

## 2024-02-27 NOTE — ED Notes (Signed)
 EDP at bedside

## 2024-02-27 NOTE — ED Provider Notes (Signed)
 [place] EMERGENCY DEPARTMENT AT [facility] Provider Note   CSN: [id number] Arrival date & time: 02/27/24  1018     History  Chief Complaint  Patient presents with   Shortness of Breath    Brent Snow is a 83 y.o. male.  HPI Patient presents via EMS with respiratory distress.  Patient has COPD, only wears oxygen as needed.  Since last night, after possibly choking on a piece of food he has had increased work of breathing, cough, congestion.  EMS reports patient had 84% room air saturation on arrival, was febrile to 101.  Patient received steroids, fluids, had improved symptoms.  Patient denies chest pain, acknowledges increased work of breathing and difficulty with doing so.    Home Medications Prior to Admission medications   Medication Sig Start Date End Date Taking? Authorizing Provider  Albuterol Sulfate (PROAIR RESPICLICK) 108 (90 Base) MCG/ACT AEPB Inhale 2 puffs into the lungs every 6 (six) hours as needed. 05/17/23   Raliegh Ip, DO  finasteride (PROSCAR) 5 MG tablet Take 1 tablet (5 mg total) by mouth daily. 05/17/23   Delynn Flavin M, DO  Fluticasone-Umeclidin-Vilant (TRELEGY ELLIPTA) 100-62.5-25 MCG/ACT AEPB USE 1 INHALATION ORALLY    DAILY 05/20/23   Delynn Flavin M, DO  lidocaine (LIDODERM) 5 % PLACE 1 PATCH ONTO THE SKIN DAILY. REMOVE AND DISCARD PATCH WITHIN 12 HOURS OR AS DIRECTED BY MD 01/16/24   Delynn Flavin M, DO  lisinopril (ZESTRIL) 10 MG tablet TAKE 1 TABLET DAILY 12/29/23   Delynn Flavin M, DO  pravastatin (PRAVACHOL) 40 MG tablet TAKE 1 TABLET DAILY 12/29/23   Delynn Flavin M, DO  predniSONE (DELTASONE) 20 MG tablet 2 po at same time daily for 5 days 12/14/23   Raliegh Ip, DO  Semaglutide,0.25 or 0.5MG /DOS, (OZEMPIC, 0.25 OR 0.5 MG/DOSE,) 2 MG/3ML SOPN Inject 0.5 mg into the skin every 7 (seven) days. 05/17/23   Raliegh Ip, DO  tamsulosin (FLOMAX) 0.4 MG CAPS capsule Take 1 capsule (0.4 mg total) by  mouth 2 (two) times daily. Patient taking differently: Take 0.4 mg by mouth daily. 05/17/23   Raliegh Ip, DO      Allergies    Patient has no known allergies.    Review of Systems   Review of Systems  Physical Exam Updated Vital Signs BP 118/62   Pulse (!) 101   Temp 99 F (37.2 C) (Oral)   Resp (!) 27   Ht 5\' 9"  (1.753 m)   Wt 86.2 kg   SpO2 97%   BMI 28.06 kg/m  Physical Exam Vitals and nursing note reviewed.  Constitutional:      General: He is in acute distress.     Appearance: He is well-developed. He is ill-appearing and diaphoretic.  HENT:     Head: Normocephalic and atraumatic.  Eyes:     Conjunctiva/sclera: Conjunctivae normal.  Cardiovascular:     Rate and Rhythm: Regular rhythm. Tachycardia present.  Pulmonary:     Effort: Tachypnea and respiratory distress present.     Breath sounds: No stridor. Decreased breath sounds and wheezing present.  Abdominal:     General: There is no distension.  Skin:    General: Skin is warm.  Neurological:     Mental Status: He is alert and oriented to person, place, and time.     ED Results / Procedures / Treatments   Labs (all labs ordered are listed, but only abnormal results are displayed) Labs Reviewed  COMPREHENSIVE METABOLIC PANEL - Abnormal; Notable for the following components:      Result Value   Glucose, Bld 208 (*)    Calcium 8.8 (*)    Albumin 3.3 (*)    All other components within normal limits  CBC WITH DIFFERENTIAL/PLATELET - Abnormal; Notable for the following components:   Lymphs Abs 0.2 (*)    All other components within normal limits  RESP PANEL BY RT-PCR (RSV, FLU A&B, COVID)  RVPGX2  CULTURE, BLOOD (ROUTINE X 2)  CULTURE, BLOOD (ROUTINE X 2)  LACTIC ACID, PLASMA  PROTIME-INR  APTT  BRAIN NATRIURETIC PEPTIDE  LACTIC ACID, PLASMA    EKG EKG Interpretation Date/Time:  Monday February 27 2024 10:46:00 EDT Ventricular Rate:  117 PR Interval:  181 QRS Duration:  88 QT  Interval:  307 QTC Calculation: 429 R Axis:   -41  Text Interpretation: Sinus tachycardia Ventricular premature complex Left axis deviation Confirmed by Gerhard Munch [phone]) on 02/27/2024 12:32:34 PM  Radiology DG Chest Port 1 View Result Date: 02/27/2024 CLINICAL DATA:  Questionable sepsis - evaluate for abnormality. Cough. Congestion. Shortness of breath. EXAM: PORTABLE CHEST 1 VIEW COMPARISON:  12/14/2023. FINDINGS: There are heterogeneous alveolar and interstitial opacities overlying the right lower lung zone, which are new since the prior study. Bilateral lung fields are otherwise clear. No dense consolidation or lung collapse. Bilateral costophrenic angles are clear. Stable cardio-mediastinal silhouette. No acute osseous abnormalities. The soft tissues are within normal limits. IMPRESSION: New heterogeneous alveolar and interstitial opacities overlying the right lower lung zone, concerning for pneumonitis. Electronically Signed   By: Jules Schick M.D.   On: 02/27/2024 11:56    Procedures Procedures    Medications Ordered in ED Medications  ipratropium-albuterol (DUONEB) 0.5-2.5 (3) MG/3ML nebulizer solution 3 mL (3 mLs Nebulization Not Given 02/27/24 1038)  vancomycin (VANCOREADY) IVPB 2000 mg/400 mL (2,000 mg Intravenous New Bag/Given 02/27/24 1058)  ipratropium-albuterol (DUONEB) 0.5-2.5 (3) MG/3ML nebulizer solution (  Given 02/27/24 1038)  lactated ringers bolus 1,000 mL (0 mLs Intravenous Stopped 02/27/24 1227)  ceFEPIme (MAXIPIME) 2 g in sodium chloride 0.9 % 100 mL IVPB (0 g Intravenous Stopped 02/27/24 1227)  acetaminophen (TYLENOL) tablet 1,000 mg (1,000 mg Oral Given 02/27/24 1100)  sodium chloride 0.9 % bolus 1,000 mL (1,000 mLs Intravenous New Bag/Given 02/27/24 1225)    ED Course/ Medical Decision Making/ A&P                                 Medical Decision Making Elderly male with COPD, now dependent on home oxygen presents tachypneic, tachycardic and febrile.  Patient  meets sepsis criteria with stagnated such receive broad-spectrum antibiotics, suspicion for pneumonia, though x-ray pending, lactic acid pending.  Patient received fluids, monitoring, bronchodilators, received steroids from EMS prior to arrival. Cardiac 115 sinus tach abnormal pulse ox 98% with 2 L nasal cannula abnormal  Amount and/or Complexity of Data Reviewed Independent Historian: EMS External Data Reviewed: notes. Labs: ordered. Decision-making details documented in ED Course. Radiology: ordered and independent interpretation performed. Decision-making details documented in ED Course. ECG/medicine tests: ordered and independent interpretation performed. Decision-making details documented in ED Course.  Risk OTC drugs. Prescription drug management. Decision regarding hospitalization.   12:33 PM Patient accompanied by family members.  Patient's viral panel negative, lactic acid value 1.3, he continues to require supplemental oxygen.  Fever has resolved.  Patient meets sepsis criteria, though there is no evidence for septic  shock.  Final Clinical Impression(s) / ED Diagnoses Final diagnoses:  Sepsis with acute hypoxic respiratory failure without septic shock, due to unspecified organism [facility])  CRITICAL CARE Performed by: Gerhard Munch Total critical care time: 35 minutes Critical care time was exclusive of separately billable procedures and treating other patients. Critical care was necessary to treat or prevent imminent or life-threatening deterioration. Critical care was time spent personally by me on the following activities: development of treatment plan with patient and/or surrogate as well as nursing, discussions with consultants, evaluation of patient's response to treatment, examination of patient, obtaining history from patient or surrogate, ordering and performing treatments and interventions, ordering and review of laboratory studies, ordering and review of radiographic  studies, pulse oximetry and re-evaluation of patient's condition.    Gerhard Munch, MD 02/27/24 [phone]

## 2024-02-27 NOTE — ED Triage Notes (Signed)
 Pt BIB [facility] for sob; pt was eating yesterday and got strangled but feels nothing went down his airway  Pt has been vomiting throughtout the night  Pt given NS with EMS  Pt was  88% with ems and placed on NRB and pt came up to 93%  Pt given solumedrol 125mg  IV with ems  BP 105/70 Hr 130's  Pt denies any pain

## 2024-02-27 NOTE — H&P (Signed)
 History and Physical    Patient: Brent Snow ZOX:[id number] DOB: 1941/04/06 DOA: 02/27/2024 DOS: the patient was seen and examined on 02/27/2024 PCP: Raliegh Ip, DO  Patient coming from: Home  Chief Complaint:  Chief Complaint  Patient presents with   Shortness of Breath   HPI: Brent Snow is a 83 year old male with a history of COPD, chronic respiratory failure on 2 L as needed, diabetes mellitus type 2, hypertension, hyperlipidemia, SDH 06/2023, BPH presenting with increased shortness of breath and congestion since choking on a piece of steak on 02/26/2024.  The patient however has noticed some increasing dyspnea with his daily activities in the 2 to 3 days prior to this admission.  However, had denied any worsening cough, hemoptysis, fevers, chills, chest pain, nausea, vomiting, diarrhea.  Patient states that he quit smoking in 2008 after smoking over 100 pack years. The patient denies any worsening lower extremity edema or orthopnea.  He denies any hematochezia or melena. Because of worsening shortness of breath over the last 24 hours, he presented for the evaluation and treatment.  Upon EMS arrival, the patient was noted to have some distress with oxygen saturation 84% room air.  He was given Solu-Medrol IV x 1. In the ED, the patient was febrile up to 101.3 F and tachycardic 110-120 and tachypneic.  He was hemodynamically stable.  Oxygen saturation is 96% on 2 L.  WBC 8.4, hemoglobin 15.4, platelets 160.  Sodium 137, potassium 4.2, bicarbonate 24, serum creatinine 1.02.  AST 17, ALT 14, Gonfa today's 65, total bilirubin 0.9, albumin 3.3.  EKG showed sinus tachycardia with nonspecific T wave changes.  COVID-19 PCR is negative.  Lactic acid 1.3>> 1.7.  The patient was given vancomycin and cefepime.  He was given bronchodilators.  Chest x-ray showed heterogenous alveolar and interstitial opacities over the right lung.  Review of Systems: As mentioned in the history of present  illness. All other systems reviewed and are negative. Past Medical History:  Diagnosis Date   Arthritis    COPD, severe (HCC)    pulmologist-  dr byrum   Dyspnea on exertion    prescribed to use O2 via [place]--- per pt he does not use O2 he just take 1-2 puffs of rescue inhaler   Hiatal hernia    History of acute respiratory failure    02-18-2007  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   Hypertension    Hypoxemic respiratory failure, chronic (HCC)    pulmologist-  dr byrum-- prescribed supplemental O2 @ 2L via [place]    OSA on CPAP    and added O2 @ 2L via [place] w/ cpap   Phimosis    S/P balloon dilatation of esophageal stricture    multiple    Subdural hematoma (HCC)    in July '24 after fall   Type 2 diabetes mellitus (HCC)    followed by pcp--  per note uncontrolled w/ last A1c >14 on 12-30-2016   Wears contact lenses    Past Surgical History:  Procedure Laterality Date   BACK SURGERY     CIRCUMCISION N/A 01/27/2017   Procedure: CIRCUMCISION ADULT;  Surgeon: Marcine Matar, MD;  Location: [facility];  Service: Urology;  Laterality: N/A;   KNEE ARTHROSCOPY Right 2008   LUMBAR SPINE SURGERY  1977  approx.   TONSILLECTOMY AND ADENOIDECTOMY  age 40   TRANSTHORACIC ECHOCARDIOGRAM  09/12/2015   mild LVH,  ef 65-70%,  grade 1 diastolic dysfunction/  mild RAE   Social History:  reports that he quit smoking about 17 years ago. His smoking use included cigarettes. He started smoking about 57 years ago. He has a 80 pack-year smoking history. He has never used smokeless tobacco. He reports current alcohol use. He reports that he does not use drugs.  No Known Allergies  Family History  Problem Relation Age of Onset   Asthma Mother    COPD Mother    Cancer Maternal Grandmother        stomach   Early death Father 53       Tractor Accident    Prior to Admission medications   Medication Sig Start Date End Date Taking? Authorizing Provider  Albuterol Sulfate (PROAIR  RESPICLICK) 108 (90 Base) MCG/ACT AEPB Inhale 2 puffs into the lungs every 6 (six) hours as needed. 05/17/23   Raliegh Ip, DO  finasteride (PROSCAR) 5 MG tablet Take 1 tablet (5 mg total) by mouth daily. 05/17/23   Delynn Flavin M, DO  Fluticasone-Umeclidin-Vilant (TRELEGY ELLIPTA) 100-62.5-25 MCG/ACT AEPB USE 1 INHALATION ORALLY    DAILY 05/20/23   Delynn Flavin M, DO  lidocaine (LIDODERM) 5 % PLACE 1 PATCH ONTO THE SKIN DAILY. REMOVE AND DISCARD PATCH WITHIN 12 HOURS OR AS DIRECTED BY MD 01/16/24   Delynn Flavin M, DO  lisinopril (ZESTRIL) 10 MG tablet TAKE 1 TABLET DAILY 12/29/23   Delynn Flavin M, DO  pravastatin (PRAVACHOL) 40 MG tablet TAKE 1 TABLET DAILY 12/29/23   Delynn Flavin M, DO  predniSONE (DELTASONE) 20 MG tablet 2 po at same time daily for 5 days 12/14/23   Raliegh Ip, DO  Semaglutide,0.25 or 0.5MG /DOS, (OZEMPIC, 0.25 OR 0.5 MG/DOSE,) 2 MG/3ML SOPN Inject 0.5 mg into the skin every 7 (seven) days. 05/17/23   Raliegh Ip, DO  tamsulosin (FLOMAX) 0.4 MG CAPS capsule Take 1 capsule (0.4 mg total) by mouth 2 (two) times daily. Patient taking differently: Take 0.4 mg by mouth daily. 05/17/23   Raliegh Ip, DO    Physical Exam: Vitals:   02/27/24 1145 02/27/24 1200 02/27/24 1215 02/27/24 1230  BP: 120/65 121/63 118/62   Pulse: (!) 105 (!) 105 (!) 101   Resp: (!) 32 (!) 21 (!) 27   Temp:    99 F (37.2 C)  TempSrc:    Oral  SpO2: 97% 96% 97%   Weight:      Height:       GENERAL:  A&O x 3, NAD, well developed, cooperative, follows commands HEENT: [place]/AT, No thrush, No icterus, No oral ulcers Neck:  No neck mass, No meningismus, soft, supple CV: RRR, no S3, no S4, no rub, no JVD Lungs:  diminished BS.  Bibasilar rales. Minimal basilar wheeze Abd: soft/NT +BS, nondistended Ext: No edema, no lymphangitis, no cyanosis, no rashes Neuro:  CN II-XII intact, strength 4/5 in RUE, RLE, strength 4/5 LUE, LLE; sensation intact bilateral; no  dysmetria; babinski equivocal  Data Reviewed: Data reviewed above in history  Assessment and Plan: Sepsis -present on admission -due to pneumonia -presented with fever, tachycardia, tachypnea -lactic 1.3>>1.7 -continue IVF -follow blood cultures  Lobar pneumonia -check PCT -MRSA screen -start ceftriaxone and azithro  COPD exacerbation -add brovana -add duonebs -continue solumedrol -add pulmicort  Acute on chronic respiratory failure with hypoxia -check VBG -on prn 2L -currently on 2L -due to pneumonia and COPD exacerbation -wean oxygen back to baseline as tolerated  Essential Hypertension -hold lisinopril due to soft BPs  Mixed hyperlipidemia -  continue statin  BPH -continue flomax and finasteride  DM2 with hyperglycemia -holding Ozempic -check A1C -anticipate elevated CBG due to steroids -novolog sliding scale   Advance Care Planning: FULL  Consults: none  Family Communication: spouse updated 3/24  Severity of Illness: The appropriate patient status for this patient is INPATIENT. Inpatient status is judged to be reasonable and necessary in order to provide the required intensity of service to ensure the patient's safety. The patient's presenting symptoms, physical exam findings, and initial radiographic and laboratory data in the context of their chronic comorbidities is felt to place them at high risk for further clinical deterioration. Furthermore, it is not anticipated that the patient will be medically stable for discharge from the hospital within 2 midnights of admission.   * I certify that at the point of admission it is my clinical judgment that the patient will require inpatient hospital care spanning beyond 2 midnights from the point of admission due to high intensity of service, high risk for further deterioration and high frequency of surveillance required.*  Author: [name], MD 02/27/2024 1:22 PM  For on call review [email or web address].

## 2024-02-27 NOTE — ED Notes (Signed)
 Pt resting comfortably with family at bedside

## 2024-02-28 DIAGNOSIS — J181 Lobar pneumonia, unspecified organism: Secondary | ICD-10-CM | POA: Diagnosis not present

## 2024-02-28 DIAGNOSIS — J441 Chronic obstructive pulmonary disease with (acute) exacerbation: Secondary | ICD-10-CM | POA: Diagnosis not present

## 2024-02-28 DIAGNOSIS — J9621 Acute and chronic respiratory failure with hypoxia: Secondary | ICD-10-CM | POA: Diagnosis not present

## 2024-02-28 DIAGNOSIS — A419 Sepsis, unspecified organism: Secondary | ICD-10-CM | POA: Diagnosis not present

## 2024-02-28 DIAGNOSIS — J9622 Acute and chronic respiratory failure with hypercapnia: Secondary | ICD-10-CM | POA: Diagnosis not present

## 2024-02-28 LAB — BASIC METABOLIC PANEL
Anion gap: 6 (ref 5–15)
BUN: 20 mg/dL (ref 8–23)
CO2: 26 mmol/L (ref 22–32)
Calcium: 8.7 mg/dL — ABNORMAL LOW (ref 8.9–10.3)
Chloride: 105 mmol/L (ref 98–111)
Creatinine, Ser: 0.73 mg/dL (ref 0.61–1.24)
GFR, Estimated: >60 mL/min (ref >60)
Glucose, Bld: 199 mg/dL — ABNORMAL HIGH (ref 70–99)
Potassium: 4.1 mmol/L (ref 3.5–5.1)
Sodium: 137 mmol/L (ref 135–145)

## 2024-02-28 LAB — URINE CULTURE: Culture: NO GROWTH

## 2024-02-28 LAB — GLUCOSE, CAPILLARY
Glucose-Capillary: 154 mg/dL — ABNORMAL HIGH (ref 70–99)
Glucose-Capillary: 281 mg/dL — ABNORMAL HIGH (ref 70–99)

## 2024-02-28 MED ORDER — BLOOD GLUCOSE MONITORING SUPPL DEVI: 1.0000 | Freq: Three times a day (TID) | 0 refills | Status: AC

## 2024-02-28 MED ORDER — INSULIN ASPART 100 UNIT/ML IJ SOLN: 0.0000 [IU] | Freq: Three times a day (TID) | INTRAMUSCULAR | Status: DC

## 2024-02-28 MED ORDER — NOVOLOG FLEXPEN 100 UNIT/ML ~~LOC~~ SOPN: PEN_INJECTOR | SUBCUTANEOUS | 1 refills | Status: DC

## 2024-02-28 MED ORDER — ULTRA FLO INSULIN PEN NEEDLES 31G X 5 MM MISC: 0 refills | Status: DC

## 2024-02-28 MED ORDER — LANCETS MISC. MISC: 1.0000 | Freq: Three times a day (TID) | 0 refills | Status: AC

## 2024-02-28 MED ORDER — AZITHROMYCIN 500 MG PO TABS: 500.0000 mg | ORAL_TABLET | Freq: Every day | ORAL | 0 refills | Status: DC

## 2024-02-28 MED ORDER — BLOOD GLUCOSE TEST VI STRP: 1.0000 | ORAL_STRIP | Freq: Three times a day (TID) | 0 refills | Status: AC

## 2024-02-28 MED ORDER — INSULIN ASPART 100 UNIT/ML IJ SOLN: 0.0000 [IU] | Freq: Every day | INTRAMUSCULAR | Status: DC

## 2024-02-28 MED ORDER — CEFDINIR 300 MG PO CAPS: 300.0000 mg | ORAL_CAPSULE | Freq: Two times a day (BID) | ORAL | 0 refills | Status: DC

## 2024-02-28 MED ORDER — LANCET DEVICE MISC: 1.0000 | Freq: Three times a day (TID) | 0 refills | Status: AC

## 2024-02-28 MED ORDER — PREDNISONE 10 MG PO TABS: 60.0000 mg | ORAL_TABLET | Freq: Every day | ORAL | 0 refills | Status: DC

## 2024-02-28 MED ORDER — PREDNISONE 20 MG PO TABS: 60.0000 mg | ORAL_TABLET | Freq: Every day | ORAL | Status: DC

## 2024-02-28 MED ORDER — IPRATROPIUM-ALBUTEROL 0.5-2.5 (3) MG/3ML IN SOLN: 3.0000 mL | Freq: Four times a day (QID) | RESPIRATORY_TRACT | 1 refills | Status: DC

## 2024-02-28 NOTE — Care Management Obs Status (Signed)
 MEDICARE OBSERVATION STATUS NOTIFICATION   Patient Details  Name: Brent Snow MRN: [id number] Date of Birth: Sep 15, 1941   Medicare Observation Status Notification Given:  Yes    Karn Cassis, LCSW 02/28/2024, 2:46 PM

## 2024-02-28 NOTE — Progress Notes (Signed)
 SATURATION QUALIFICATIONS: (This note is used to comply with regulatory documentation for home oxygen)   Patient Saturations on Room Air at Rest = 89   Patient Saturations on Room Air while Ambulating = 74   Patient Saturations on 2 Liters of oxygen while Ambulating = 94   Please briefly explain why patient needs home oxygen: To maintain 02 sat at 90% or above during ambulation.  Brent Hartshorn, DO

## 2024-02-28 NOTE — TOC Transition Note (Signed)
 Transition of Care [facility]) - Discharge Note   Patient Details  Name: Brent Snow MRN: [id number] Date of Birth: 1941-08-04  Transition of Care [facility]) CM/SW Contact:  Karn Cassis, LCSW Phone Number: 02/28/2024, 3:09 PM   Clinical Narrative: Pt will need home O2 and nebulizer. LCSW met with pt and wife at bedside. They initially requested [employer], but then said they thought his O2 he currently has from 2007 is through [employer]. LCSW spoke with Ashly with [employer] who confirms he has O2 through [employer]. Per Ashly, they will send someone out this evening to confirm equipment is working properly and replace if needed. Daughter is bringing portable tank from home for transport. New orders and saturation qualification note in. [employer] will deliver nebulizer to home this evening.       Final next level of care: Home/Self Care Barriers to Discharge: Barriers Resolved   Patient Goals and CMS Choice Patient states their goals for this hospitalization and ongoing recovery are:: return home   Choice offered to / list presented to : Patient [place] ownership interest in [facility].provided to::  (n/a)    Discharge Placement                    Patient and family notified of of transfer: 02/28/24  Discharge Plan and Services Additional resources added to the After Visit Summary for                  DME Arranged: Oxygen, Nebulizer machine DME Agency: P[facility]y [facility]ager Date DME Agency Contacted: 02/28/24 Time DME Agency Contacted: [phone] Representative spoke with at DME Agency: Ashly            Social Drivers of Health (SDOH) Interventions SDOH Screenings   Food Insecurity: No Food Insecurity (02/27/2024)  Housing: Low Risk  (02/27/2024)  Transportation Needs: No Transportation Needs (02/27/2024)  Utilities: Not At Risk (02/27/2024)  Alcohol Screen: Low Risk  (12/23/2023)  Depression (PHQ2-9): Low Risk  (12/23/2023)  Financial Resource Strain: Low Risk  (12/23/2023)   Physical Activity: Insufficiently Active (12/23/2023)  Social Connections: Socially Integrated (02/27/2024)  Stress: No Stress Concern Present (12/23/2023)  Tobacco Use: Medium Risk (02/27/2024)  Health Literacy: Adequate Health Literacy (12/23/2023)     Readmission Risk Interventions    02/27/2024    8:12 PM  Readmission Risk Prevention Plan  Post Dischage Appt Complete  Medication Screening Complete  Transportation Screening Complete

## 2024-02-28 NOTE — Care Management CC44 (Signed)
 Condition Code 44 Documentation Completed  Patient Details  Name: Brent Snow MRN: [id number] Date of Birth: [birth year]   Condition Code 44 given:  Yes Patient signature on Condition Code 44 notice:  Yes Documentation of 2 MD's agreement:  Yes Code 44 added to claim:  Yes    Karn Cassis, LCSW 02/28/2024, 2:46 PM

## 2024-02-28 NOTE — Discharge Summary (Signed)
Physician Discharge Summary   Patient: Brent Snow MRN: [id number] DOB: March 05, 1941  Admit date:     02/27/2024  Discharge date: 02/28/24  Discharge Physician: [name] Aerilynn Goin   PCP: Raliegh Ip, DO   Recommendations at discharge:  { Please follow up with primary care provider within 1-2 weeks  Please repeat BMP and CBC in one week     Hospital Course: 83 year old male with a history of COPD, chronic respiratory failure on 2 L as needed, diabetes mellitus type 2, hypertension, hyperlipidemia, SDH 06/2023, BPH presenting with increased shortness of breath and congestion since choking on a piece of steak on 02/26/2024.  The patient however has noticed some increasing dyspnea with his daily activities in the 2 to 3 days prior to this admission.  However, had denied any worsening cough, hemoptysis, fevers, chills, chest pain, nausea, vomiting, diarrhea.  Patient states that he quit smoking in 2008 after smoking over 100 pack years. The patient denies any worsening lower extremity edema or orthopnea.  He denies any hematochezia or melena. Because of worsening shortness of breath over the last 24 hours, he presented for the evaluation and treatment.  Upon EMS arrival, the patient was noted to have some distress with oxygen saturation 84% room air.  He was given Solu-Medrol IV x 1. In the ED, the patient was febrile up to 101.3 F and tachycardic 110-120 and tachypneic.  He was hemodynamically stable.  Oxygen saturation is 96% on 2 L.  WBC 8.4, hemoglobin 15.4, platelets 160.  Sodium 137, potassium 4.2, bicarbonate 24, serum creatinine 1.02.  AST 17, ALT 14, Gonfa today's 65, total bilirubin 0.9, albumin 3.3.  EKG showed sinus tachycardia with nonspecific T wave changes.  COVID-19 PCR is negative.  Lactic acid 1.3>> 1.7.  The patient was given vancomycin and cefepime.  He was given bronchodilators.  Chest x-ray showed heterogenous alveolar and interstitial opacities over the right  lung.  Assessment and Plan: Sepsis -present on admission -due to pneumonia -presented with fever, tachycardia, tachypnea -lactic 1.3>>1.7 -continue IVF -follow blood cultures--neg to date -02/28/24 sepsis physiology resolved   Lobar pneumonia -check PCT 1.37 -MRSA screen--neg -start ceftriaxone and azithro -d/c home with cefdinir x 5 more days, azithro x 4 more days   COPD exacerbation -added brovana -addd duonebs -continue solumedrol -added pulmicort -d/c home with prednisone taper   Acute on chronic respiratory failure with hypoxia -check VBG 7.29/57/37/27 -on prn 2L -currently on 2L -due to pneumonia and COPD exacerbation -wean oxygen back to baseline as tolerated -pt ambulated on 02/28/24--desaturated to 74% on RA -d/c home with oxygen 2L, to wear 24/7   Essential Hypertension -hold lisinopril due to soft BPs--restart after d/c as BPs improved   Mixed hyperlipidemia -continue statin   BPH -continue flomax and finasteride   DM2 with hyperglycemia -holding Ozempic -02/27/24 A1C--7.2 -anticipate elevated CBG due to steroids -novolog sliding scale -d/c home with novolog flexpen and sliding scale while on steroids       Consultants: none Procedures performed: none  Disposition: Home Diet recommendation:  Carb modified diet DISCHARGE MEDICATION: Allergies as of 02/28/2024   No Known Allergies      Medication List     TAKE these medications    Albuterol Sulfate 108 (90 Base) MCG/ACT Aepb Commonly known as: PROAIR RESPICLICK Inhale 2 puffs into the lungs every 6 (six) hours as needed.   azithromycin 500 MG tablet Commonly known as: ZITHROMAX Take 1 tablet (500 mg total) by mouth daily. Start taking on:  February 29, 2024   Blood Glucose Monitoring Suppl Devi 1 each by Does not apply route in the morning, at noon, and at bedtime. May substitute to any manufacturer covered by patient's insurance.   BLOOD GLUCOSE TEST STRIPS Strp 1 each by In Vitro  route in the morning, at noon, and at bedtime. May substitute to any manufacturer covered by patient's insurance.   cefdinir 300 MG capsule Commonly known as: OMNICEF Take 1 capsule (300 mg total) by mouth 2 (two) times daily.   finasteride 5 MG tablet Commonly known as: PROSCAR Take 1 tablet (5 mg total) by mouth daily.   ipratropium-albuterol 0.5-2.5 (3) MG/3ML Soln Commonly known as: DUONEB Take 3 mLs by nebulization every 6 (six) hours.   Lancet Device Misc 1 each by Does not apply route in the morning, at noon, and at bedtime. May substitute to any manufacturer covered by patient's insurance.   Lancets Misc. Misc 1 each by Does not apply route in the morning, at noon, and at bedtime. May substitute to any manufacturer covered by patient's insurance.   lidocaine 5 % Commonly known as: LIDODERM PLACE 1 PATCH ONTO THE SKIN DAILY. REMOVE AND DISCARD PATCH WITHIN 12 HOURS OR AS DIRECTED BY MD   lisinopril 10 MG tablet Commonly known as: ZESTRIL TAKE 1 TABLET DAILY   metFORMIN 500 MG tablet Commonly known as: GLUCOPHAGE Take 500 mg by mouth daily.   NovoLOG FlexPen 100 UNIT/ML FlexPen Generic drug: insulin aspart USE TID WITH MEALS SUGAR 121-150--2 units; 151-200--3 units; 201-250; 5 units; 251-300; 8 units; 301-350--11units; 351-400--15 units   Ozempic (0.25 or 0.5 MG/DOSE) 2 MG/3ML Sopn Generic drug: Semaglutide(0.25 or 0.5MG /DOS) Inject 0.5 mg into the skin every 7 (seven) days.   pravastatin 40 MG tablet Commonly known as: PRAVACHOL TAKE 1 TABLET DAILY   predniSONE 10 MG tablet Commonly known as: DELTASONE Take 6 tablets (60 mg total) by mouth daily with breakfast. And decrease by one tablet daily Start taking on: February 29, 2024   tamsulosin 0.4 MG Caps capsule Commonly known as: FLOMAX Take 1 capsule (0.4 mg total) by mouth 2 (two) times daily. What changed: when to take this   Trelegy Ellipta 100-62.5-25 MCG/ACT Aepb Generic drug:  Fluticasone-Umeclidin-Vilant USE 1 INHALATION ORALLY    DAILY   Ultra Flo Insulin Pen Needles 31G X 5 MM Misc Generic drug: Insulin Pen Needle Use with insulin pen to dispense insulin as instructed               Durable Medical Equipment  (From admission, onward)           Start     Ordered   02/28/24 1347  For home use only DME oxygen  Once       Question Answer Comment  Length of Need 6 Months   Mode or (Route) Nasal cannula   Liters per Minute 2   Frequency Continuous (stationary and portable oxygen unit needed)   Oxygen conserving device Yes   Oxygen delivery system Gas      02/28/24 1346   02/28/24 1347  For home use only DME Nebulizer machine  Once       Question Answer Comment  Patient needs a nebulizer to treat with the following condition COPD exacerbation (HCC)   Length of Need 6 Months   Additional equipment included Administration kit      02/28/24 1346            Discharge Exam: [facility] Weights   02/27/24  1031 02/27/24 1400  Weight: 86.2 kg 83.4 kg   HEENT:  [place]/AT, No thrush, no icterus CV:  RRR, no rub, no S3, no S4 Lung:  bibasilar rales.  No wheeze Abd:  soft/+BS, NT Ext:  No edema, no lymphangitis, no synovitis, no rash   Condition at discharge: stable  The results of significant diagnostics from this hospitalization (including imaging, microbiology, ancillary and laboratory) are listed below for reference.   Imaging Studies: CT CHEST WO CONTRAST Result Date: 02/27/2024 CLINICAL DATA:  Dyspnea, fever, shortness of breath EXAM: CT CHEST WITHOUT CONTRAST TECHNIQUE: Multidetector CT imaging of the chest was performed following the standard protocol without IV contrast. RADIATION DOSE REDUCTION: This exam was performed according to the departmental dose-optimization program which includes automated exposure control, adjustment of the mA and/or kV according to patient size and/or use of iterative reconstruction technique. COMPARISON:   02/27/2024 radiographs and chest CT 09/21/2023 FINDINGS: Cardiovascular: Coronary, aortic arch, and branch vessel atherosclerotic vascular disease. Mediastinum/Nodes: Right paratracheal node measuring 1.7 cm in short axis on image 65 of series 2 has a fatty hilum and previously measured the same. No other adenopathy observed. Small type 1 hiatal hernia. Lungs/Pleura: 3.2 by 2.2 cm mass in the posterior basal segment right lower lobe on image 128 series 3, previously the same on 02/12/2013. With 10 years of stability the appearance is compatible with a benign process. Emphysema. Stable 5 mm right upper lobe nodule on image 38 series 3. Faint tree-in-bud reticulonodular opacities in the left lung. Asymmetric confluent regions of interstitial accentuation and some with faint airspace opacity and subtle nodularity scattered in the right lung but more concentrated in the right lower lobe, new from 09/21/2023. This is most confluent in the right lower lobe or superimposed bacterial pneumonia process is not excluded. Upper Abdomen: Unremarkable Musculoskeletal: Thoracic spondylosis. IMPRESSION: 1. Asymmetric confluent regions of reticular and reticulonodular density and some with faint airspace opacity and subtle nodularity scattered in the right lung but more concentrated in the right lower lobe, new from 09/21/2023. Appearance favors atypical pneumonia although other entities such as acute hypersensitivity pneumonitis or pulmonary hemorrhage might have a similar appearance. This is most confluent in the right lower lobe and superimposed bacterial pneumonia process is not excluded. 2. Faint tree-in-bud reticulonodular opacities in the left lung. 3. Chronically stable right lower lobe lung mass is likely benign given the stability over the last 10 years. 4. Prominent right paratracheal lymph node is stable from October 2024 and possibly reactive. 5. Coronary, aortic arch, and branch vessel atherosclerotic vascular disease.  6. Small type 1 hiatal hernia. Electronically Signed   By: Gaylyn Rong M.D.   On: 02/27/2024 19:15   DG Chest Port 1 View Result Date: 02/27/2024 CLINICAL DATA:  Questionable sepsis - evaluate for abnormality. Cough. Congestion. Shortness of breath. EXAM: PORTABLE CHEST 1 VIEW COMPARISON:  12/14/2023. FINDINGS: There are heterogeneous alveolar and interstitial opacities overlying the right lower lung zone, which are new since the prior study. Bilateral lung fields are otherwise clear. No dense consolidation or lung collapse. Bilateral costophrenic angles are clear. Stable cardio-mediastinal silhouette. No acute osseous abnormalities. The soft tissues are within normal limits. IMPRESSION: New heterogeneous alveolar and interstitial opacities overlying the right lower lung zone, concerning for pneumonitis. Electronically Signed   By: Jules Schick M.D.   On: 02/27/2024 11:56    Microbiology: Results for orders placed or performed during the hospital encounter of 02/27/24  Resp panel by RT-PCR (RSV, Flu A&B, Covid) Anterior Nasal  Swab     Status: None   Collection Time: 02/27/24 10:35 AM   Specimen: Anterior Nasal Swab  Result Value Ref Range Status   SARS Coronavirus 2 by RT PCR NEGATIVE NEGATIVE Final    Comment: (NOTE) SARS-CoV-2 target nucleic acids are NOT DETECTED.  The SARS-CoV-2 RNA is generally detectable in upper respiratory specimens during the acute phase of infection. The lowest concentration of SARS-CoV-2 viral copies this assay can detect is 138 copies/mL. A negative result does not preclude SARS-Cov-2 infection and should not be used as the sole basis for treatment or other patient management decisions. A negative result may occur with  improper specimen collection/handling, submission of specimen other than nasopharyngeal swab, presence of viral mutation(s) within the areas targeted by this assay, and inadequate number of viral copies(<138 copies/mL). A negative result  must be combined with clinical observations, patient history, and epidemiological information. The expected result is Negative.  Fact Sheet for Patients:  BloggerCourse.com  Fact Sheet for Healthcare Providers:  SeriousBroker.it  This test is no t yet approved or cleared by the [place] FDA and  has been authorized for detection and/or diagnosis of SARS-CoV-2 by FDA under an Emergency Use Authorization (EUA). This EUA will remain  in effect (meaning this test can be used) for the duration of the COVID-19 declaration under Section 564(b)(1) of the Act, 21 U.S.C.section 360bbb-3(b)(1), unless the authorization is terminated  or revoked sooner.       Influenza A by PCR NEGATIVE NEGATIVE Final   Influenza B by PCR NEGATIVE NEGATIVE Final    Comment: (NOTE) The Xpert Xpress SARS-CoV-2/FLU/RSV plus assay is intended as an aid in the diagnosis of influenza from Nasopharyngeal swab specimens and should not be used as a sole basis for treatment. Nasal washings and aspirates are unacceptable for Xpert Xpress SARS-CoV-2/FLU/RSV testing.  Fact Sheet for Patients: BloggerCourse.com  Fact Sheet for Healthcare Providers: SeriousBroker.it  This test is not yet approved or cleared by the [place] FDA and has been authorized for detection and/or diagnosis of SARS-CoV-2 by FDA under an Emergency Use Authorization (EUA). This EUA will remain in effect (meaning this test can be used) for the duration of the COVID-19 declaration under Section 564(b)(1) of the Act, 21 U.S.C. section 360bbb-3(b)(1), unless the authorization is terminated or revoked.     Resp Syncytial Virus by PCR NEGATIVE NEGATIVE Final    Comment: (NOTE) Fact Sheet for Patients: BloggerCourse.com  Fact Sheet for Healthcare Providers: SeriousBroker.it  This test is  not yet approved or cleared by the [place] FDA and has been authorized for detection and/or diagnosis of SARS-CoV-2 by FDA under an Emergency Use Authorization (EUA). This EUA will remain in effect (meaning this test can be used) for the duration of the COVID-19 declaration under Section 564(b)(1) of the Act, 21 U.S.C. section 360bbb-3(b)(1), unless the authorization is terminated or revoked.  Performed at [facility], [address], [place], [place] [address]   Blood Culture (routine x 2)     Status: None (Preliminary result)   Collection Time: 02/27/24 10:35 AM   Specimen: BLOOD  Result Value Ref Range Status   Specimen Description BLOOD BLOOD RIGHT ARM  Final   Special Requests   Final    BOTTLES DRAWN AEROBIC AND ANAEROBIC Blood Culture adequate volume   Culture   Final    NO GROWTH < 24 HOURS Performed at [facility], [address] [place], [place] [address]    Report Status PENDING  Incomplete  Blood Culture (routine x 2)     Status: None (Preliminary result)   Collection Time: 02/27/24 10:54 AM   Specimen: BLOOD  Result Value Ref Range Status   Specimen Description BLOOD BLOOD RIGHT HAND  Final   Special Requests   Final    BOTTLES DRAWN AEROBIC AND ANAEROBIC Blood Culture results may not be optimal due to an inadequate volume of blood received in culture bottles   Culture   Final    NO GROWTH < 24 HOURS Performed at [facility], [address], [place], [place] [address]    Report Status PENDING  Incomplete  MRSA Next Gen by PCR, Nasal     Status: None   Collection Time: 02/27/24  3:10 PM   Specimen: Nasal Mucosa; Nasal Swab  Result Value Ref Range Status   MRSA by PCR Next Gen NOT DETECTED NOT DETECTED Final    Comment: (NOTE) The GeneXpert MRSA Assay (FDA approved for NASAL specimens only), is one component of a comprehensive MRSA colonization surveillance program. It is not intended to diagnose MRSA infection nor to guide or monitor treatment for MRSA  infections. Test performance is not FDA approved in patients less than 58 years old. Performed at [facility], [address] [place], [place] [address]   Respiratory (~20 pathogens) panel by PCR     Status: None   Collection Time: 02/27/24  3:10 PM   Specimen: Nasopharyngeal Swab; Respiratory  Result Value Ref Range Status   Adenovirus NOT DETECTED NOT DETECTED Final   Coronavirus 229E NOT DETECTED NOT DETECTED Final    Comment: (NOTE) The Coronavirus on the Respiratory Panel, DOES NOT test for the novel  Coronavirus (2019 nCoV)    Coronavirus HKU1 NOT DETECTED NOT DETECTED Final   Coronavirus NL63 NOT DETECTED NOT DETECTED Final   Coronavirus OC43 NOT DETECTED NOT DETECTED Final   Metapneumovirus NOT DETECTED NOT DETECTED Final   Rhinovirus / Enterovirus NOT DETECTED NOT DETECTED Final   Influenza A NOT DETECTED NOT DETECTED Final   Influenza B NOT DETECTED NOT DETECTED Final   Parainfluenza Virus 1 NOT DETECTED NOT DETECTED Final   Parainfluenza Virus 2 NOT DETECTED NOT DETECTED Final   Parainfluenza Virus 3 NOT DETECTED NOT DETECTED Final   Parainfluenza Virus 4 NOT DETECTED NOT DETECTED Final   Respiratory Syncytial Virus NOT DETECTED NOT DETECTED Final   Bordetella pertussis NOT DETECTED NOT DETECTED Final   Bordetella Parapertussis NOT DETECTED NOT DETECTED Final   Chlamydophila pneumoniae NOT DETECTED NOT DETECTED Final   Mycoplasma pneumoniae NOT DETECTED NOT DETECTED Final    Comment: Performed at [facility], [address] [place], [place] [address]    Labs: CBC: Recent Labs  Lab 02/27/24 1035  WBC 8.4  NEUTROABS 7.6  HGB 15.4  HCT 48.3  MCV 96.6  PLT 168   Basic Metabolic Panel: Recent Labs  Lab 02/27/24 1035 02/28/24 0428  NA 137 137  K 4.2 4.1  CL 102 105  CO2 24 26  GLUCOSE 208* 199*  BUN 21 20  CREATININE 1.02 0.73  CALCIUM 8.8* 8.7*   Liver Function Tests: Recent Labs  Lab 02/27/24 1035  AST 17  ALT 14  ALKPHOS 65  BILITOT  0.9  PROT 6.5  ALBUMIN 3.3*   CBG: Recent Labs  Lab 02/27/24 1617 02/27/24 2133 02/28/24 0741 02/28/24 1131  GLUCAP 197* 291* 154* 281*    Discharge time spent: greater than 30 minutes.  Signed: [name], MD [facility]  02/28/2024 

## 2024-02-29 ENCOUNTER — Telehealth: Payer: Self-pay

## 2024-02-29 NOTE — Transitions of Care (Post Inpatient/ED Visit) (Signed)
 02/29/2024  Name: Brent Snow MRN: [id number] DOB: 06-24-1941  Today's TOC FU Call Status: Today's TOC FU Call Status:: Successful TOC FU Call Completed TOC FU Call Complete Date: 02/29/24 Patient's Name and Date of Birth confirmed.  Transition Care Management Follow-up Telephone Call Date of Discharge: 02/28/24 [facility] Type of Discharge: Emergency Department Reason for ED Visit: Other: (Sepsis) How have you been since you were released from the hospital?: Better Any questions or concerns?: No  Items Reviewed: Did you receive and understand the discharge instructions provided?: Yes Medications obtained,verified, and reconciled?: Yes (Medications Reviewed) Any new allergies since your discharge?: No Dietary orders reviewed?: NA Do you have support at home?: Yes People in Home: spouse  Medications Reviewed Today: Medications Reviewed Today     Reviewed by Anthoney Harada, LPN (Licensed Practical Nurse) on 02/29/24 at 1329  Med List Status: <None>   Medication Order Taking? Sig Documenting Provider Last Dose Status Informant  Albuterol Sulfate (PROAIR RESPICLICK) 108 (90 Base) MCG/ACT AEPB [id number] Yes Inhale 2 puffs into the lungs every 6 (six) hours as needed. Raliegh Ip, DO Taking Active Spouse/Significant Other, Pharmacy Records, Self  azithromycin (ZITHROMAX) 500 MG tablet [id number] Yes Take 1 tablet (500 mg total) by mouth daily. Brent Hartshorn, MD Taking Active   Blood Glucose Monitoring Suppl DEVI [id number] Yes 1 each by Does not apply route in the morning, at noon, and at bedtime. May substitute to any manufacturer covered by patient's insurance. Brent Hartshorn, MD Taking Active   cefdinir (OMNICEF) 300 MG capsule [id number] Yes Take 1 capsule (300 mg total) by mouth 2 (two) times daily. Brent Hartshorn, MD Taking Active   finasteride (PROSCAR) 5 MG tablet [id number] Yes Take 1 tablet (5 mg total) by mouth daily. Raliegh Ip, DO Taking  Active Spouse/Significant Other, Pharmacy Records, Self  Fluticasone-Umeclidin-Vilant [facility] ELLIPTA) 100-62.5-25 MCG/ACT AEPB [id number] Yes USE 1 INHALATION ORALLY    DAILY Raliegh Ip, DO Taking Active Spouse/Significant Other, Pharmacy Records, Self  Glucose Blood (BLOOD GLUCOSE TEST STRIPS) STRP [id number] Yes 1 each by In Vitro route in the morning, at noon, and at bedtime. May substitute to any manufacturer covered by patient's insurance. Brent Hartshorn, MD Taking Active   insulin aspart (NOVOLOG FLEXPEN) 100 UNIT/ML FlexPen [id number] Yes USE TID WITH MEALS SUGAR 121-150--2 units; 151-200--3 units; 201-250; 5 units; 251-300; 8 units; 301-350--11units; 351-400--15 units [name], [name], MD Taking Active   Insulin Pen Needle (ULTRA FLO INSULIN PEN NEEDLES) 31G X 5 MM MISC [id number] Yes Use with insulin pen to dispense insulin as instructed [name], Brent Hua, MD Taking Active   ipratropium-albuterol (DUONEB) 0.5-2.5 (3) MG/3ML SOLN [id number] Yes Take 3 mLs by nebulization every 6 (six) hours. Brent Hartshorn, MD Taking Active   Lancet Device MISC [id number] Yes 1 each by Does not apply route in the morning, at noon, and at bedtime. May substitute to any manufacturer covered by patient's insurance. Brent Hartshorn, MD Taking Active   Lancets Misc. MISC [id number] Yes 1 each by Does not apply route in the morning, at noon, and at bedtime. May substitute to any manufacturer covered by patient's insurance. Brent Hartshorn, MD Taking Active   lidocaine (LIDODERM) 5 % [id number] Yes PLACE 1 PATCH ONTO THE SKIN DAILY. REMOVE AND DISCARD PATCH WITHIN 12 HOURS OR AS DIRECTED BY MD Raliegh Ip, DO Taking Active Self, Spouse/Significant Other, Pharmacy Records  lisinopril (ZESTRIL) 10 MG tablet [id number] Yes TAKE 1 TABLET DAILY Raliegh Ip, DO Taking  Active Self, Spouse/Significant Other, Pharmacy Records  metFORMIN (GLUCOPHAGE) 500 MG tablet [id number] Yes Take 500 mg by mouth daily. [provider] Taking Active  Spouse/Significant Other, Self, Pharmacy Records  pravastatin (PRAVACHOL) 40 MG tablet [id number] Yes TAKE 1 TABLET DAILY Raliegh Ip, DO Taking Active Self, Spouse/Significant Other, Pharmacy Records  predniSONE (DELTASONE) 10 MG tablet [id number] Yes Take 6 tablets (60 mg total) by mouth daily with breakfast. And decrease by one tablet daily [name], [name], MD Taking Active   Semaglutide,0.25 or 0.5MG /DOS, (OZEMPIC, 0.25 OR 0.5 MG/DOSE,) 2 MG/3ML SOPN [id number] Yes Inject 0.5 mg into the skin every 7 (seven) days. Raliegh Ip, DO Taking Active Spouse/Significant Other, Pharmacy Records, Self  tamsulosin [facility]) 0.4 MG CAPS capsule [id number] Yes Take 1 capsule (0.4 mg total) by mouth 2 (two) times daily.  Patient taking differently: Take 0.4 mg by mouth daily.   Raliegh Ip, DO Taking Active Spouse/Significant Other, Pharmacy Records, Self            Home Care and Equipment/Supplies: Were Home Health Services Ordered?: NA Any new equipment or medical supplies ordered?: NA  Functional Questionnaire: Do you need assistance with bathing/showering or dressing?: No Do you need assistance with meal preparation?: No Do you need assistance with eating?: No Do you have difficulty maintaining continence: No Do you need assistance with getting out of bed/getting out of a chair/moving?: No Do you have difficulty managing or taking your medications?: No  Follow up appointments reviewed: PCP Follow-up appointment confirmed?: Yes Date of PCP follow-up appointment?: 03/01/24 Follow-up Provider: Jannifer Rodney NP Specialist Hospital Follow-up appointment confirmed?: NA Do you need transportation to your follow-up appointment?: No Do you understand care options if your condition(s) worsen?: Yes-patient verbalized understanding    SIGNATURE Kandis Fantasia, LPN [facility] Health Advisor [place] [facility] You Are. We Are. [facility] Direct Dial [phone]

## 2024-03-01 ENCOUNTER — Encounter: Payer: Self-pay | Admitting: Family

## 2024-03-01 ENCOUNTER — Ambulatory Visit: Admitting: Family

## 2024-03-01 VITALS — BP 121/74 | HR 66 | Temp 97.2°F | Ht 66.0 in | Wt 186.0 lb

## 2024-03-01 DIAGNOSIS — Z09 Encounter for follow-up examination after completed treatment for conditions other than malignant neoplasm: Secondary | ICD-10-CM | POA: Diagnosis not present

## 2024-03-01 DIAGNOSIS — J449 Chronic obstructive pulmonary disease, unspecified: Secondary | ICD-10-CM | POA: Diagnosis not present

## 2024-03-01 DIAGNOSIS — E1169 Type 2 diabetes mellitus with other specified complication: Secondary | ICD-10-CM | POA: Diagnosis not present

## 2024-03-01 DIAGNOSIS — J9611 Chronic respiratory failure with hypoxia: Secondary | ICD-10-CM

## 2024-03-01 DIAGNOSIS — J189 Pneumonia, unspecified organism: Secondary | ICD-10-CM

## 2024-03-01 DIAGNOSIS — A419 Sepsis, unspecified organism: Secondary | ICD-10-CM

## 2024-03-01 NOTE — Progress Notes (Signed)
 Subjective:    Patient ID: Brent Snow, male    DOB: 10-Mar-1941, 83 y.o.   MRN: [id number]  Chief Complaint  Patient presents with   Transitions Of Care   Today's visit was for Transitional Care Management.  The patient was discharged from [facility] on 02/28/24  with a primary diagnosis of sepsis.   Contact with the patient and/or caregiver, by a clinical staff member, was made on 02/29/24 and was documented as a telephone encounter within the EMR.  Through chart review and discussion with the patient I have determined that management of their condition is of moderate complexity.    He has hx of COPD, DM, HTN, and chronic respiratory failure on 2 L. Pt was treated for CAP and diagnosed with sepsis. He was discharged home with cefinir for 5 days and azithro for 4 days, and prednisone tamper.   His glucose was elevated during the hospital and was given insulin as needed.   Diabetes He presents for his follow-up diabetic visit. He has type 2 diabetes mellitus.      Review of Systems  Social History   Socioeconomic History   Marital status: Married    Spouse name: Not on file   Number of children: 2   Years of education: 15   Highest education level: Some college, no degree  Occupational History   Occupation: Retired    Comment: Librarian, academic  Tobacco Use   Smoking status: Former    Current packs/day: 0.00    Average packs/day: 2.0 packs/day for 40.0 years (80.0 ttl pk-yrs)    Types: Cigarettes    Start date: 12/06/1966    Quit date: 12/06/2006    Years since quitting: 17.2   Smokeless tobacco: Never  Vaping Use   Vaping status: Never Used  Substance and Sexual Activity   Alcohol use: Yes    Alcohol/week: 0.0 standard drinks of alcohol    Comment: VERY LITTLE   Drug use: No   Sexual activity: Not on file  Other Topics Concern   Not on file  Social History Narrative   Not on file   Social Drivers of Health   Financial Resource  Strain: Low Risk  (12/23/2023)   Overall Financial Resource Strain (CARDIA)    Difficulty of Paying Living Expenses: Not hard at all  Food Insecurity: No Food Insecurity (02/27/2024)   Hunger Vital Sign    Worried About Running Out of Food in the Last Year: Never true    Ran Out of Food in the Last Year: Never true  Transportation Needs: No Transportation Needs (02/27/2024)   PRAPARE - Administrator, Civil Service (Medical): No    Lack of Transportation (Non-Medical): No  Physical Activity: Insufficiently Active (12/23/2023)   Exercise Vital Sign    Days of Exercise per Week: 2 days    Minutes of Exercise per Session: 20 min  Stress: No Stress Concern Present (12/23/2023)   [employer] of Occupational Health - Occupational Stress Questionnaire    Feeling of Stress : Not at all  Social Connections: Socially Integrated (02/27/2024)   Social Connection and Isolation Panel [NHANES]    Frequency of Communication with Friends and Family: Twice a week    Frequency of Social Gatherings with Friends and Family: Once a week    Attends Religious Services: 1 to 4 times per year    Active Member of [employer] or Organizations: No    Attends Banker Meetings: 1  to 4 times per year    Marital Status: Married   Family History  Problem Relation Age of Onset   Asthma Mother    COPD Mother    Cancer Maternal Grandmother        stomach   Early death Father 71       Tractor Accident        Objective:   Physical Exam Vitals reviewed.  Constitutional:      General: He is not in acute distress.    Appearance: He is well-developed. He is obese.  HENT:     Head: Normocephalic.     Right Ear: Tympanic membrane normal.     Left Ear: Tympanic membrane normal.  Eyes:     General:        Right eye: No discharge.        Left eye: No discharge.     Pupils: Pupils are equal, round, and reactive to light.  Neck:     Thyroid: No thyromegaly.  Cardiovascular:     Rate and  Rhythm: Normal rate and regular rhythm.     Heart sounds: Normal heart sounds. No murmur heard. Pulmonary:     Effort: Pulmonary effort is normal. No respiratory distress.     Breath sounds: Wheezing and rhonchi present.  Abdominal:     General: Bowel sounds are normal. There is no distension.     Palpations: Abdomen is soft.     Tenderness: There is no abdominal tenderness.  Musculoskeletal:        General: No tenderness. Normal range of motion.     Cervical back: Normal range of motion and neck supple.     Right lower leg: Edema (trace) present.     Left lower leg: No edema.  Skin:    General: Skin is warm and dry.     Findings: No erythema or rash.  Neurological:     Mental Status: He is alert and oriented to person, place, and time.     Cranial Nerves: No cranial nerve deficit.     Deep Tendon Reflexes: Reflexes are normal and symmetric.  Psychiatric:        Behavior: Behavior normal.        Thought Content: Thought content normal.        Judgment: Judgment normal.       BP 121/74   Pulse 66   Temp (!) 97.2 F (36.2 C) (Temporal)   Ht 5\' 6"  (1.676 m)   Wt 186 lb (84.4 kg)   SpO2 93%   BMI 30.02 kg/m      Assessment & Plan:  Brent Snow comes in today with chief complaint of Transitions Of Care   Diagnosis and orders addressed:  1. Chronic hypoxic respiratory failure (HCC) (Primary) - DME Wheelchair manual - CBC with Differential/Platelet - CMP14+EGFR - DG Chest 2 View; Future  2. Chronic obstructive pulmonary disease, unspecified COPD type (HCC) - DME Wheelchair manual - CBC with Differential/Platelet - CMP14+EGFR  3. Type 2 diabetes mellitus with other specified complication, without long-term current use of insulin (HCC)  - CBC with Differential/Platelet - CMP14+EGFR  4. Hospital discharge follow-up - CBC with Differential/Platelet - CMP14+EGFR  5. Community acquired pneumonia, unspecified laterality - CBC with Differential/Platelet -  CMP14+EGFR - DG Chest 2 View; Future   Labs pending Complete cefinir, azithro for and prednisone Will repeat chest x-ray in 3 weeks.  PT can stop sliding scale insulin after completing prednisone  Discussed wearing oxygen,  his O2 is improved today on room air. Want O2 >90% Continue current medications  Keep follow up with specialists  Keep follow up with PCP   Jannifer Rodney, FNP

## 2024-03-01 NOTE — Patient Instructions (Signed)
 Hypoxemia  Hypoxemia happens when the blood does not have enough oxygen in it. Every part of the body needs oxygen to work well. Oxygen enters the lungs when a person breathes in, and then it travels to all parts of the body through the blood. Hypoxemia can develop suddenly or slowly and can be mild to severe. What are the causes? Causes of hypoxemia may include: Lung conditions. These may include: Long-term (chronic) lung disease, such as: Asthma. Chronic obstructive pulmonary disease (COPD). Interstitial lung disease. Problems that affect breathing at night, such as sleep apnea. Fluid buildup in the lungs. Lung infection (pneumonia). Lung or throat cancer. A collapsed lung. Heart or blood vessel (vascular) conditions, such as: A blood clot in the lungs (pulmonary embolism). Certain types of heart disease. Other causes may include: Certain diseases that affect nerves or muscles. Slow or shallow breathing due to being very overweight (obesity hypoventilation). High altitudes, as there is less oxygen in the air. Toxic chemicals, smoke, and gases. What are the signs or symptoms? In some cases, there may be no symptoms of this condition. If you do have symptoms, they may include: Shortness of breath. Breathing that is fast, noisy, or shallow. Bluish color of the skin, lips, or nail beds. A fast heartbeat. Feeling tired or sleepy. Feeling confused or agitated. If hypoxemia develops quickly, it is likely you will suddenly have trouble breathing. If hypoxemia develops slowly over months or years, you may not notice any symptoms. How is this diagnosed? This condition is diagnosed by: A physical exam. A blood test that measures the amount of oxygen in your blood. A test that measures the percentage of oxygen in your blood (pulse oximetry). This is done by placing a sensor on your finger, toe, or earlobe. How is this treated? Treatment for this condition depends on the cause or the  severity of your hypoxemia. You will likely be treated with oxygen therapy to restore your blood oxygen level. You may need oxygen therapy for a short time, such as weeks or months, or you may need it for the rest of your life. You may be asked to lie on your stomach (prone). This may help bring your oxygen level up or help you feel less short of breath. Your health care provider may also recommend other therapies to treat the underlying cause of your hypoxemia. Follow these instructions at home:  Take over-the-counter and prescription medicines only as told by your health care provider. If you are on oxygen therapy, follow oxygen safety precautions as directed by your health care provider. Precautions may include: Always have a backup supply of oxygen. Do not let anyone smoke or have an open flame near your oxygen supply. Handle oxygen tanks carefully as told by your health care provider. Do not use any products that contain nicotine or tobacco. These products include cigarettes, chewing tobacco, and vaping devices, such as e-cigarettes. If you need help quitting, ask your health care provider. Stay away from people who smoke. Keep all follow-up visits. This is important. Contact a health care provider if: You have any concerns about your oxygen therapy. You have trouble breathing, even while wearing an oxygen supply. You become short of breath when you exercise. You are still tired or have a headache when you wake up. Get help right away if: Your shortness of breath gets worse, especially with normal activity or only a little bit of activity. Your skin, lips, or nail beds are a bluish color. You become confused, or you  cannot think properly. You have chest pain. You have a fever. These symptoms may represent a serious problem that is an emergency. Do not wait to see if the symptoms will go away. Get medical help right away. Call your local emergency services (911 in the [place] Do not drive  yourself to the hospital. Summary Hypoxemia occurs when the blood does not have enough oxygen in it. Hypoxemia may or may not cause symptoms. Often, the main symptom is shortness of breath. Depending on the cause of your hypoxemia, you may need oxygen therapy for a short time, such as weeks or months, or you may need it for the rest of your life. If you are on oxygen therapy, follow oxygen safety precautions as directed by your health care provider. This information is not intended to replace advice given to you by your health care provider. Make sure you discuss any questions you have with your health care provider. Document Revised: 02/24/2021 Document Reviewed: 02/24/2021 Elsevier Patient Education  2024 [employer].

## 2024-03-02 LAB — CBC WITH DIFFERENTIAL/PLATELET
Basophils Absolute: 0 10*3/uL (ref 0.0–0.2)
Basos: 0 %
EOS (ABSOLUTE): 0 10*3/uL (ref 0.0–0.4)
Eos: 0 %
Hematocrit: 44.4 % (ref 37.5–51.0)
Hemoglobin: 14.2 g/dL (ref 13.0–17.7)
Immature Grans (Abs): 0 10*3/uL (ref 0.0–0.1)
Immature Granulocytes: 0 %
Lymphocytes Absolute: 0.9 10*3/uL (ref 0.7–3.1)
Lymphs: 10 %
MCH: 30.1 pg (ref 26.6–33.0)
MCHC: 32 g/dL (ref 31.5–35.7)
MCV: 94 fL (ref 79–97)
Monocytes Absolute: 0.6 10*3/uL (ref 0.1–0.9)
Monocytes: 7 %
Neutrophils Absolute: 6.9 10*3/uL (ref 1.4–7.0)
Neutrophils: 83 %
Platelets: 188 10*3/uL (ref 150–450)
RBC: 4.71 x10E6/uL (ref 4.14–5.80)
RDW: 12.9 % (ref 11.6–15.4)
WBC: 8.4 10*3/uL (ref 3.4–10.8)

## 2024-03-02 LAB — CMP14+EGFR
ALT: 22 IU/L (ref 0–44)
AST: 14 IU/L (ref 0–40)
Albumin: 3.7 g/dL — ABNORMAL LOW (ref 3.8–4.8)
Alkaline Phosphatase: 79 IU/L (ref 44–121)
BUN/Creatinine Ratio: 25 — ABNORMAL HIGH (ref 10–24)
BUN: 21 mg/dL (ref 8–27)
Bilirubin Total: 0.3 mg/dL (ref 0.0–1.2)
CO2: 26 mmol/L (ref 20–29)
Calcium: 9.5 mg/dL (ref 8.6–10.2)
Chloride: 104 mmol/L (ref 96–106)
Creatinine, Ser: 0.85 mg/dL (ref 0.76–1.27)
Globulin, Total: 2.1 g/dL (ref 1.5–4.5)
Glucose: 141 mg/dL — ABNORMAL HIGH (ref 70–99)
Potassium: 4.6 mmol/L (ref 3.5–5.2)
Sodium: 143 mmol/L (ref 134–144)
Total Protein: 5.8 g/dL — ABNORMAL LOW (ref 6.0–8.5)
eGFR: 88 mL/min/{1.73_m2} (ref >59)

## 2024-03-03 LAB — CULTURE, BLOOD (ROUTINE X 2): Culture: NO GROWTH

## 2024-03-05 LAB — CULTURE, BLOOD (ROUTINE X 2): Special Requests: ADEQUATE

## 2024-03-12 NOTE — Progress Notes (Signed)
 History of Present Illness: Brent Snow is here for followup of BPH w/ a  h/o retention.  He is still on dual medical therapy with tamsulosin and finasteride.  Past Medical History:  Diagnosis Date   Arthritis    COPD, severe (HCC)    pulmologist-  dr byrum   Dyspnea on exertion    prescribed to use O2 via [place]--- per pt he does not use O2 he just take 1-2 puffs of rescue inhaler   Hiatal hernia    History of acute respiratory failure    02-18-2007  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   Hypertension    Hypoxemic respiratory failure, chronic (HCC)    pulmologist-  dr byrum-- prescribed supplemental O2 @ 2L via [place]    OSA on CPAP    and added O2 @ 2L via [place] w/ cpap   Phimosis    S/P balloon dilatation of esophageal stricture    multiple    Subdural hematoma (HCC)    in July '24 after fall   Type 2 diabetes mellitus (HCC)    followed by pcp--  per note uncontrolled w/ last A1c >14 on 12-30-2016   Wears contact lenses     Past Surgical History:  Procedure Laterality Date   BACK SURGERY     CIRCUMCISION N/A 01/27/2017   Procedure: CIRCUMCISION ADULT;  Surgeon: Marcine Matar, MD;  Location: [facility];  Service: Urology;  Laterality: N/A;   KNEE ARTHROSCOPY Right 2008   LUMBAR SPINE SURGERY  1977  approx.   TONSILLECTOMY AND ADENOIDECTOMY  age 47   TRANSTHORACIC ECHOCARDIOGRAM  09/12/2015   mild LVH,  ef 65-70%,  grade 1 diastolic dysfunction/  mild RAE    Home Medications:  Allergies as of 03/13/2024   No Known Allergies      Medication List        Accurate as of March 12, 2024 12:44 PM. If you have any questions, ask your nurse or doctor.          Albuterol Sulfate 108 (90 Base) MCG/ACT Aepb Commonly known as: PROAIR RESPICLICK Inhale 2 puffs into the lungs every 6 (six) hours as needed.   azithromycin 500 MG tablet Commonly known as: ZITHROMAX Take 1 tablet (500 mg total) by mouth daily.   Blood Glucose Monitoring Suppl Devi 1 each by Does  not apply route in the morning, at noon, and at bedtime. May substitute to any manufacturer covered by patient's insurance.   BLOOD GLUCOSE TEST STRIPS Strp 1 each by In Vitro route in the morning, at noon, and at bedtime. May substitute to any manufacturer covered by patient's insurance.   cefdinir 300 MG capsule Commonly known as: OMNICEF Take 1 capsule (300 mg total) by mouth 2 (two) times daily.   finasteride 5 MG tablet Commonly known as: PROSCAR Take 1 tablet (5 mg total) by mouth daily.   ipratropium-albuterol 0.5-2.5 (3) MG/3ML Soln Commonly known as: DUONEB Take 3 mLs by nebulization every 6 (six) hours.   Lancet Device Misc 1 each by Does not apply route in the morning, at noon, and at bedtime. May substitute to any manufacturer covered by patient's insurance.   Lancets Misc. Misc 1 each by Does not apply route in the morning, at noon, and at bedtime. May substitute to any manufacturer covered by patient's insurance.   lidocaine 5 % Commonly known as: LIDODERM PLACE 1 PATCH ONTO THE SKIN DAILY. REMOVE AND DISCARD PATCH WITHIN 12 HOURS OR AS  DIRECTED BY MD   lisinopril 10 MG tablet Commonly known as: ZESTRIL TAKE 1 TABLET DAILY   metFORMIN 500 MG tablet Commonly known as: GLUCOPHAGE Take 500 mg by mouth daily.   NovoLOG FlexPen 100 UNIT/ML FlexPen Generic drug: insulin aspart USE TID WITH MEALS SUGAR 121-150--2 units; 151-200--3 units; 201-250; 5 units; 251-300; 8 units; 301-350--11units; 351-400--15 units   Ozempic (0.25 or 0.5 MG/DOSE) 2 MG/3ML Sopn Generic drug: Semaglutide(0.25 or 0.5MG /DOS) Inject 0.5 mg into the skin every 7 (seven) days.   pravastatin 40 MG tablet Commonly known as: PRAVACHOL TAKE 1 TABLET DAILY   predniSONE 10 MG tablet Commonly known as: DELTASONE Take 6 tablets (60 mg total) by mouth daily with breakfast. And decrease by one tablet daily   tamsulosin 0.4 MG Caps capsule Commonly known as: FLOMAX Take 1 capsule (0.4 mg total) by  mouth 2 (two) times daily. What changed: when to take this   Trelegy Ellipta 100-62.5-25 MCG/ACT Aepb Generic drug: Fluticasone-Umeclidin-Vilant USE 1 INHALATION ORALLY    DAILY   Ultra Flo Insulin Pen Needles 31G X 5 MM Misc Generic drug: Insulin Pen Needle Use with insulin pen to dispense insulin as instructed        Allergies: No Known Allergies  Family History  Problem Relation Age of Onset   Asthma Mother    COPD Mother    Cancer Maternal Grandmother        stomach   Early death Father 9       Tractor Accident    Social History:  reports that he quit smoking about 17 years ago. His smoking use included cigarettes. He started smoking about 57 years ago. He has a 80 pack-year smoking history. He has never used smokeless tobacco. He reports current alcohol use. He reports that he does not use drugs.  ROS: A complete review of systems was performed.  All systems are negative except for pertinent findings as noted.  Physical Exam:  Vital signs in last 24 hours: There were no vitals taken for this visit. Constitutional:  Alert and oriented, No acute distress Cardiovascular: Regular rate  Respiratory: Normal respiratory effort Genitourinary: Normal circumcised male phallus, testes are descended bilaterally and non-tender and without masses, scrotum is normal in appearance without lesions or masses, perineum is normal on inspection. Normal anal sphincter tone, prostate 60 gms. Lymphatic: No lymphadenopathy Neurologic: Grossly intact, no focal deficits Psychiatric: Normal mood and affect  I have reviewed prior pt notes  I have reviewed urinalysis results  I have independently reviewed prior imaging  I have reviewed prior PSA results  IPSS reviewed   Impression/Assessment:  BPH w/ LUTS--managed well w/ dual med Rx  Plan:  Continue tamsulosin as well as finasteride  OV 1 year

## 2024-03-13 ENCOUNTER — Ambulatory Visit (INDEPENDENT_AMBULATORY_CARE_PROVIDER_SITE_OTHER): Payer: Medicare Other | Admitting: Urology

## 2024-03-13 VITALS — BP 96/52 | HR 96

## 2024-03-13 DIAGNOSIS — N401 Enlarged prostate with lower urinary tract symptoms: Secondary | ICD-10-CM | POA: Diagnosis not present

## 2024-03-13 DIAGNOSIS — R339 Retention of urine, unspecified: Secondary | ICD-10-CM

## 2024-03-13 LAB — URINALYSIS, ROUTINE W REFLEX MICROSCOPIC
Bilirubin, UA: NEGATIVE
Glucose, UA: NEGATIVE
Ketones, UA: NEGATIVE
Nitrite, UA: NEGATIVE
Protein,UA: NEGATIVE
RBC, UA: NEGATIVE
Specific Gravity, UA: 1.02 (ref 1.005–1.030)
Urobilinogen, Ur: 0.2 mg/dL (ref 0.2–1.0)
pH, UA: 6 (ref 5.0–7.5)

## 2024-03-13 LAB — MICROSCOPIC EXAMINATION: Bacteria, UA: NONE SEEN

## 2024-03-13 NOTE — Progress Notes (Signed)
 Bladder Scan completed today.  Patient can void prior to the bladder scan. Bladder scan result: 7  Performed By: Alfonse Spruce. CMA  Additional notes-

## 2024-03-16 ENCOUNTER — Encounter: Payer: Self-pay | Admitting: Family Medicine

## 2024-03-20 NOTE — Telephone Encounter (Signed)
 Call back to [facility] pharmacy [phone] RE: Arformoterol, ok given to use generic

## 2024-03-20 NOTE — Telephone Encounter (Signed)
 Copied from CRM [id number]. Topic: Clinical - Prescription Issue >> Mar 20, 2024 11:42 AM Zipporah Him wrote: Reason for CRM: [facility] pharmacy calling wanting to get a verbal authorization on filling a prescription with a generic version as they do not carry the name brand. [phone] option 4 for any pharmacist.

## 2024-03-23 ENCOUNTER — Other Ambulatory Visit: Payer: Self-pay | Admitting: Emergency Medicine

## 2024-03-23 ENCOUNTER — Other Ambulatory Visit: Payer: Self-pay | Admitting: Family Medicine

## 2024-03-23 DIAGNOSIS — J449 Chronic obstructive pulmonary disease, unspecified: Secondary | ICD-10-CM

## 2024-03-23 DIAGNOSIS — E1169 Type 2 diabetes mellitus with other specified complication: Secondary | ICD-10-CM

## 2024-03-26 ENCOUNTER — Encounter: Payer: Self-pay | Admitting: Family Medicine

## 2024-03-26 ENCOUNTER — Ambulatory Visit: Admitting: Family Medicine

## 2024-03-26 ENCOUNTER — Ambulatory Visit (INDEPENDENT_AMBULATORY_CARE_PROVIDER_SITE_OTHER)

## 2024-03-26 VITALS — BP 157/78 | HR 82 | Temp 98.3°F | Ht 66.0 in | Wt 185.0 lb

## 2024-03-26 DIAGNOSIS — J9611 Chronic respiratory failure with hypoxia: Secondary | ICD-10-CM

## 2024-03-26 DIAGNOSIS — J189 Pneumonia, unspecified organism: Secondary | ICD-10-CM | POA: Diagnosis not present

## 2024-03-26 DIAGNOSIS — J439 Emphysema, unspecified: Secondary | ICD-10-CM | POA: Diagnosis not present

## 2024-03-26 DIAGNOSIS — R918 Other nonspecific abnormal finding of lung field: Secondary | ICD-10-CM | POA: Diagnosis not present

## 2024-03-26 DIAGNOSIS — S8011XA Contusion of right lower leg, initial encounter: Secondary | ICD-10-CM | POA: Diagnosis not present

## 2024-03-26 NOTE — Progress Notes (Signed)
 Chief Complaint  Patient presents with   Skin spot    On the back of the  right calf. Melanoma in the past. Not itchy of painful. Just noticed this weekend.     HPI  Patient presents today for a lesion on the back of the right calf.  He has had melanoma in the past and they are concerned for recurrence.  Of note is that he has a puppy that likes to jump up on his legs and he has had several scratches as a result  PMH: Smoking status noted Review of Systems  Objective: BP (!) 157/78   Pulse 82   Temp 98.3 F (36.8 C)   Ht 5\' 6"  (1.676 m)   Wt 185 lb (83.9 kg)   SpO2 (!) 85%   BMI 29.86 kg/m  Gen: NAD, alert, cooperative with exam HEENT: NCAT,  Skin: There is a 3 cm bruise/hematoma of a bluish-black color but it is flat and does not have the recognizable characteristics of a melanoma. Neuro: Alert and oriented, No gross deficits Assessment: Hematoma Hematoma of right lower leg    Patient reassured.  Encouraged to keep the puppy from jumping up on him.  Follow-up if this fails to heal as expected or if signs and symptoms of infection occur.

## 2024-03-27 ENCOUNTER — Other Ambulatory Visit: Payer: Self-pay | Admitting: Family

## 2024-03-27 ENCOUNTER — Other Ambulatory Visit: Payer: Self-pay | Admitting: Family Medicine

## 2024-03-27 ENCOUNTER — Ambulatory Visit: Payer: Self-pay

## 2024-03-27 DIAGNOSIS — E1169 Type 2 diabetes mellitus with other specified complication: Secondary | ICD-10-CM

## 2024-03-27 DIAGNOSIS — J189 Pneumonia, unspecified organism: Secondary | ICD-10-CM

## 2024-03-27 MED ORDER — LEVOFLOXACIN 750 MG PO TABS: 750.0000 mg | ORAL_TABLET | Freq: Every day | ORAL | 0 refills | Status: AC

## 2024-03-27 NOTE — Telephone Encounter (Signed)
 Please see that this was already responded to in another encounter

## 2024-03-27 NOTE — Telephone Encounter (Signed)
 Christina from [facility] called in, for clearer instructions on Ozempic , if it should be titrated again or done at the 0.5 every 7 days. Ref # for cb is [id number]   Reason for Disposition  [1] Follow-up call from patient regarding patient's clinical status AND [2] information NON-URGENT  Answer Assessment - Initial Assessment Questions 1. REASON FOR CALL or QUESTION: "What is your reason for calling today?" or "How can I best help you?" or "What question do you have that I can help answer?"     Christina from [facility] called in, for clearer instructions on Ozempic , if it should be titrated again or done at the 0.5 every 7 days. Ref # for cb is [id number] 2. CALLER: Document the source of call. (e.g., laboratory, patient).     Pharmacist  Protocols used: PCP Call - No Triage-A-AH

## 2024-03-27 NOTE — Telephone Encounter (Signed)
 Name from pharmacy: OZEMP .25/.5 INJ 2MG /3ML   Pharmacy comment: Ozempic  0.25/0.5mg  pen, please verify  directions, this is a refill, does patient need to titrate again? Please respond to the pharmacy.

## 2024-04-17 DIAGNOSIS — L814 Other melanin hyperpigmentation: Secondary | ICD-10-CM | POA: Diagnosis not present

## 2024-04-17 DIAGNOSIS — I781 Nevus, non-neoplastic: Secondary | ICD-10-CM | POA: Diagnosis not present

## 2024-04-17 DIAGNOSIS — L821 Other seborrheic keratosis: Secondary | ICD-10-CM | POA: Diagnosis not present

## 2024-04-17 DIAGNOSIS — Z85828 Personal history of other malignant neoplasm of skin: Secondary | ICD-10-CM | POA: Diagnosis not present

## 2024-04-17 DIAGNOSIS — L570 Actinic keratosis: Secondary | ICD-10-CM | POA: Diagnosis not present

## 2024-04-17 DIAGNOSIS — Z85820 Personal history of malignant melanoma of skin: Secondary | ICD-10-CM | POA: Diagnosis not present

## 2024-05-10 DIAGNOSIS — M51362 Other intervertebral disc degeneration, lumbar region with discogenic back pain and lower extremity pain: Secondary | ICD-10-CM | POA: Diagnosis not present

## 2024-05-10 DIAGNOSIS — M5416 Radiculopathy, lumbar region: Secondary | ICD-10-CM | POA: Diagnosis not present

## 2024-05-10 DIAGNOSIS — Z9889 Other specified postprocedural states: Secondary | ICD-10-CM | POA: Diagnosis not present

## 2024-05-29 ENCOUNTER — Ambulatory Visit (INDEPENDENT_AMBULATORY_CARE_PROVIDER_SITE_OTHER): Payer: Medicare Other | Admitting: Family Medicine

## 2024-05-29 ENCOUNTER — Encounter: Payer: Self-pay | Admitting: Family Medicine

## 2024-05-29 VITALS — BP 136/72 | HR 74 | Temp 98.6°F | Ht 66.0 in | Wt 178.0 lb

## 2024-05-29 DIAGNOSIS — N400 Enlarged prostate without lower urinary tract symptoms: Secondary | ICD-10-CM | POA: Diagnosis not present

## 2024-05-29 DIAGNOSIS — Z6379 Other stressful life events affecting family and household: Secondary | ICD-10-CM | POA: Diagnosis not present

## 2024-05-29 DIAGNOSIS — E785 Hyperlipidemia, unspecified: Secondary | ICD-10-CM | POA: Diagnosis not present

## 2024-05-29 DIAGNOSIS — E1159 Type 2 diabetes mellitus with other circulatory complications: Secondary | ICD-10-CM

## 2024-05-29 DIAGNOSIS — N401 Enlarged prostate with lower urinary tract symptoms: Secondary | ICD-10-CM

## 2024-05-29 DIAGNOSIS — I152 Hypertension secondary to endocrine disorders: Secondary | ICD-10-CM | POA: Diagnosis not present

## 2024-05-29 DIAGNOSIS — E1169 Type 2 diabetes mellitus with other specified complication: Secondary | ICD-10-CM | POA: Diagnosis not present

## 2024-05-29 DIAGNOSIS — Z7985 Long-term (current) use of injectable non-insulin antidiabetic drugs: Secondary | ICD-10-CM

## 2024-05-29 DIAGNOSIS — N138 Other obstructive and reflux uropathy: Secondary | ICD-10-CM

## 2024-05-29 LAB — BAYER DCA HB A1C WAIVED: HB A1C (BAYER DCA - WAIVED): 6.6 % — ABNORMAL HIGH (ref 4.8–5.6)

## 2024-05-29 LAB — LIPID PANEL

## 2024-05-29 MED ORDER — FINASTERIDE 5 MG PO TABS: 5.0000 mg | ORAL_TABLET | Freq: Every day | ORAL | 4 refills | Status: AC

## 2024-05-29 MED ORDER — LISINOPRIL 10 MG PO TABS
10.0000 mg | ORAL_TABLET | Freq: Every day | ORAL | 4 refills | Status: DC
Start: 2024-05-29 — End: 2024-08-08

## 2024-05-29 MED ORDER — TAMSULOSIN HCL 0.4 MG PO CAPS: 0.4000 mg | ORAL_CAPSULE | Freq: Every day | ORAL | 4 refills | Status: AC

## 2024-05-29 MED ORDER — PRAVASTATIN SODIUM 40 MG PO TABS: 40.0000 mg | ORAL_TABLET | Freq: Every day | ORAL | 4 refills | Status: DC

## 2024-05-29 NOTE — Progress Notes (Signed)
 Brent Snow is a 83 y.o. male presents to office today for annual physical exam examination.    Concerns today include: 1. Type 2 Diabetes with hypertension, hyperlipidemia:  Compliant with medications.  He has been losing a little weight but this has been since he has been on the Ozempic .  He reports no chest pain, shortness of breath or abdominal pain  Last eye exam: UTD Last foot exam: needs Last A1c:  Lab Results  Component Value Date   HGBA1C 7.2 (H) 02/27/2024   Nephropathy screen indicated?: needs Last flu, zoster and/or pneumovax:  Immunization History  Administered Date(s) Administered   Fluad Quad(high Dose 65+) 10/25/2019, 09/01/2020, 11/04/2021, 09/01/2022   Fluad Trivalent(High Dose 65+) 11/16/2023   Influenza, High Dose Seasonal PF 10/18/2018   Influenza,inj,quad, With Preservative 10/02/2018   Moderna Sars-Covid-2 Vaccination 12/18/2019   PFIZER(Purple Top)SARS-COV-2 Vaccination 02/15/2020   Pneumococcal Conjugate-13 10/24/2013   Pneumococcal Polysaccharide-23 12/06/2006, 10/11/2012, 11/03/2016   Td 05/17/2006   Td (Adult),5 Lf Tetanus Toxid, Preservative Free 05/17/2006   Zoster Recombinant(Shingrix) 07/06/2021, 01/06/2022     2.  Stress Wife was recently diagnosed with recurrent breast cancer.  He notes that she has been really strong through everything and is trying to navigate this diagnosis with a light heart but he does worry about her.  He does not like to talk too much about it but is working through the process with her.  Occupation: Retired, Marital status: Married, Substance use: None Health Maintenance Due  Topic Date Due   COVID-19 Vaccine (3 - Mixed Product risk series) 03/14/2020   Diabetic kidney evaluation - Urine ACR  05/16/2024   FOOT EXAM  05/16/2024   Refills needed today: All  Immunization History  Administered Date(s) Administered   Fluad Quad(high Dose 65+) 10/25/2019, 09/01/2020, 11/04/2021, 09/01/2022   Fluad  Trivalent(High Dose 65+) 11/16/2023   Influenza, High Dose Seasonal PF 10/18/2018   Influenza,inj,quad, With Preservative 10/02/2018   Moderna Sars-Covid-2 Vaccination 12/18/2019   PFIZER(Purple Top)SARS-COV-2 Vaccination 02/15/2020   Pneumococcal Conjugate-13 10/24/2013   Pneumococcal Polysaccharide-23 12/06/2006, 10/11/2012, 11/03/2016   Td 05/17/2006   Td (Adult),5 Lf Tetanus Toxid, Preservative Free 05/17/2006   Zoster Recombinant(Shingrix) 07/06/2021, 01/06/2022   Past Medical History:  Diagnosis Date   Arthritis    COPD, severe (HCC)    pulmologist-  dr byrum   Dyspnea on exertion    prescribed to use O2 via [place]--- per pt he does not use O2 he just take 1-2 puffs of rescue inhaler   Hiatal hernia    History of acute respiratory failure    02-18-2007  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   Hypertension    Hypoxemic respiratory failure, chronic (HCC)    pulmologist-  dr byrum-- prescribed supplemental O2 @ 2L via [place]    OSA on CPAP    and added O2 @ 2L via [place] w/ cpap   Phimosis    S/P balloon dilatation of esophageal stricture    multiple    Subdural hematoma (HCC)    in July '24 after fall   Type 2 diabetes mellitus (HCC)    followed by pcp--  per note uncontrolled w/ last A1c >14 on 12-30-2016   Wears contact lenses    Social History   Socioeconomic History   Marital status: Married    Spouse name: Not on file   Number of children: 2   Years of education: 15   Highest education level: Some college, no degree  Occupational History   Occupation: Retired    Comment: Librarian, academic  Tobacco Use   Smoking status: Former    Current packs/day: 0.00    Average packs/day: 2.0 packs/day for 40.0 years (80.0 ttl pk-yrs)    Types: Cigarettes    Start date: 12/06/1966    Quit date: 12/06/2006    Years since quitting: 17.4   Smokeless tobacco: Never  Vaping Use   Vaping status: Never Used  Substance and Sexual Activity   Alcohol use: Yes     Alcohol/week: 0.0 standard drinks of alcohol    Comment: VERY LITTLE   Drug use: No   Sexual activity: Not on file  Other Topics Concern   Not on file  Social History Narrative   Not on file   Social Drivers of Health   Financial Resource Strain: Low Risk  (12/23/2023)   Overall Financial Resource Strain (CARDIA)    Difficulty of Paying Living Expenses: Not hard at all  Food Insecurity: No Food Insecurity (02/27/2024)   Hunger Vital Sign    Worried About Running Out of Food in the Last Year: Never true    Ran Out of Food in the Last Year: Never true  Transportation Needs: No Transportation Needs (02/27/2024)   PRAPARE - Administrator, Civil Service (Medical): No    Lack of Transportation (Non-Medical): No  Physical Activity: Insufficiently Active (12/23/2023)   Exercise Vital Sign    Days of Exercise per Week: 2 days    Minutes of Exercise per Session: 20 min  Stress: No Stress Concern Present (12/23/2023)   [employer] of Occupational Health - Occupational Stress Questionnaire    Feeling of Stress : Not at all  Social Connections: Socially Integrated (02/27/2024)   Social Connection and Isolation Panel    Frequency of Communication with Friends and Family: Twice a week    Frequency of Social Gatherings with Friends and Family: Once a week    Attends Religious Services: 1 to 4 times per year    Active Member of Clubs or Organizations: No    Attends Banker Meetings: 1 to 4 times per year    Marital Status: Married  Catering manager Violence: Not At Risk (02/27/2024)   Humiliation, Afraid, Rape, and Kick questionnaire    Fear of Current or Ex-Partner: No    Emotionally Abused: No    Physically Abused: No    Sexually Abused: No   Past Surgical History:  Procedure Laterality Date   BACK SURGERY     CIRCUMCISION N/A 01/27/2017   Procedure: CIRCUMCISION ADULT;  Surgeon: Garnette Shack, MD;  Location: [facility];  Service: Urology;   Laterality: N/A;   KNEE ARTHROSCOPY Right 2008   LUMBAR SPINE SURGERY  1977  approx.   TONSILLECTOMY AND ADENOIDECTOMY  age 16   TRANSTHORACIC ECHOCARDIOGRAM  09/12/2015   mild LVH,  ef 65-70%,  grade 1 diastolic dysfunction/  mild RAE   Family History  Problem Relation Age of Onset   Asthma Mother    COPD Mother    Cancer Maternal Grandmother        stomach   Early death Father 57       Tractor Accident    Current Outpatient Medications:    Albuterol  Sulfate (PROAIR  RESPICLICK) 108 (90 Base) MCG/ACT AEPB, Inhale 2 puffs into the lungs every 6 (six) hours as needed., Disp: 1 each, Rfl: 5   Blood Glucose Monitoring Suppl DEVI, 1 each by  Does not apply route in the morning, at noon, and at bedtime. May substitute to any manufacturer covered by patient's insurance., Disp: 1 each, Rfl: 0   finasteride  (PROSCAR ) 5 MG tablet, Take 1 tablet (5 mg total) by mouth daily., Disp: 90 tablet, Rfl: 3   Fluticasone -Umeclidin-Vilant (TRELEGY ELLIPTA ) 100-62.5-25 MCG/ACT AEPB, USE 1 INHALATION ORALLY    DAILY, Disp: 180 each, Rfl: 0   insulin  aspart (NOVOLOG  FLEXPEN) 100 UNIT/ML FlexPen, USE TID WITH MEALS SUGAR 121-150--2 units; 151-200--3 units; 201-250; 5 units; 251-300; 8 units; 301-350--11units; 351-400--15 units, Disp: 15 mL, Rfl: 1   Insulin  Pen Needle (ULTRA FLO INSULIN  PEN NEEDLES) 31G X 5 MM MISC, Use with insulin  pen to dispense insulin  as instructed, Disp: 100 each, Rfl: 0   ipratropium-albuterol  (DUONEB) 0.5-2.5 (3) MG/3ML SOLN, Take 3 mLs by nebulization every 6 (six) hours., Disp: 360 mL, Rfl: 1   lidocaine  (LIDODERM ) 5 %, PLACE 1 PATCH ONTO THE SKIN DAILY. REMOVE AND DISCARD PATCH WITHIN 12 HOURS OR AS DIRECTED BY MD, Disp: 30 patch, Rfl: PRN   lisinopril  (ZESTRIL ) 10 MG tablet, TAKE 1 TABLET DAILY, Disp: 90 tablet, Rfl: 1   metFORMIN  (GLUCOPHAGE ) 500 MG tablet, Take 500 mg by mouth daily., Disp: , Rfl:    pravastatin  (PRAVACHOL ) 40 MG tablet, TAKE 1 TABLET DAILY, Disp: 90 tablet, Rfl: 1    Semaglutide ,0.25 or 0.5MG /DOS, (OZEMPIC , 0.25 OR 0.5 MG/DOSE,) 2 MG/3ML SOPN, Inject 0.5 mg into the skin every 7 (seven) days., Disp: 9 mL, Rfl: 3   tamsulosin  (FLOMAX ) 0.4 MG CAPS capsule, Take 1 capsule (0.4 mg total) by mouth 2 (two) times daily. (Patient taking differently: Take 0.4 mg by mouth daily.), Disp: 180 capsule, Rfl: 3  No Known Allergies   ROS: Review of Systems Behavioral: Anxiety surrounding wife's new diagnosis    Physical exam BP 136/72   Pulse 74   Temp 98.6 F (37 C)   Ht 5' 6 (1.676 m)   Wt 178 lb (80.7 kg)   SpO2 91%   BMI 28.73 kg/m  General appearance: alert, cooperative, appears stated age, and mild distress Head: Normocephalic, without obvious abnormality, atraumatic Eyes: negative findings: lids and lashes normal, conjunctivae and sclerae normal, corneas clear, and pupils equal, round, reactive to light and accomodation Ears: normal TM's and external ear canals both ears Nose: Nares normal. Septum midline. Mucosa normal. No drainage or sinus tenderness. Throat: lips, mucosa, and tongue normal; teeth and gums normal Neck: no adenopathy, no carotid bruit, supple, symmetrical, trachea midline, and thyroid  not enlarged, symmetric, no tenderness/mass/nodules Back: Some rigidity of the spine Lungs: clear to auscultation bilaterally Heart: regular rate and rhythm, S1, S2 normal, no murmur, click, rub or gallop Abdomen: Soft, nontender.  Protuberant. Extremities: extremities normal, atraumatic, no cyanosis or edema and varicose veins noted Pulses: 2+ and symmetric Skin: Multiple areas of hyperpigmentation, varicose veins along the thighs.  He has onychomycotic changes to the nails of the toes bilaterally with associated skin breakdown of the plantar aspects of the feet consistent with fungal infection Lymph nodes: Cervical, supraclavicular, and axillary nodes normal. Neurologic: Hard of hearing and some mild balance issues but otherwise unremarkable      05/29/2024    8:53 AM 03/01/2024   12:04 PM 12/23/2023    2:24 PM  Depression screen PHQ 2/9  Decreased Interest 1 0 0  Down, Depressed, Hopeless 0 0 0  PHQ - 2 Score 1 0 0  Altered sleeping 0  0  Tired, decreased energy 1  0  Change in appetite 0  0  Feeling bad or failure about yourself  0  0  Trouble concentrating 1  0  Moving slowly or fidgety/restless 0  0  Suicidal thoughts 0  0  PHQ-9 Score 3  0  Difficult doing work/chores Not difficult at all  Not difficult at all      05/29/2024    8:53 AM 11/16/2023    9:32 AM 05/17/2023    2:28 PM 09/01/2022    1:07 PM  GAD 7 : Generalized Anxiety Score  Nervous, Anxious, on Edge 2 0 0 0  Control/stop worrying 2 0 0 0  Worry too much - different things 1 0 0 0  Trouble relaxing 0 0 0 0  Restless 0 0 0 0  Easily annoyed or irritable 0 0 0 0  Afraid - awful might happen 2 0 0 0  Total GAD 7 Score 7 0 0 0  Anxiety Difficulty   Not difficult at all Not difficult at all   Diabetic Foot Exam - Simple   Simple Foot Form Diabetic Foot exam was performed with the following findings: Yes 05/29/2024  9:51 AM  Visual Inspection See comments: Yes Sensation Testing Intact to touch and monofilament testing bilaterally: Yes Pulse Check Posterior Tibialis and Dorsalis pulse intact bilaterally: Yes Comments Some skin breakdown on the plantar aspects of the feet bilaterally consistent with fungal infection.  He has onychomycotic changes to the nails bilaterally       Assessment/ Plan: Fairy CHRISTELLA Boehringer here for annual physical exam.   Type 2 diabetes mellitus with other specified complication, without long-term current use of insulin  (HCC) - Plan: Bayer DCA Hb A1c Waived, Microalbumin / creatinine urine ratio, CMP14+EGFR, CBC  Hyperlipidemia associated with type 2 diabetes mellitus (HCC) - Plan: CMP14+EGFR, Lipid Panel, pravastatin  (PRAVACHOL ) 40 MG tablet  Hypertension associated with diabetes (HCC) - Plan: CMP14+EGFR, lisinopril   (ZESTRIL ) 10 MG tablet  Benign prostatic hyperplasia without lower urinary tract symptoms - Plan: CMP14+EGFR, PSA, CBC  Stress due to illness of family member  BPH with urinary obstruction - Plan: finasteride  (PROSCAR ) 5 MG tablet, tamsulosin  (FLOMAX ) 0.4 MG CAPS capsule  No physical exam performed today.  Declines vaccination.  Foot exam performed.  A1c was controlled at 6.6.  He will continue all medications as prescribed.  Check urine microalbumin, renal function, fasting lipid panel  Continue all medications as prescribed  Asymptomatic from a prostate standpoint.  Check PSA  Offered counseling and intervention should he need it going forward as they navigate his wife's new diagnosis  Counseled on healthy lifestyle choices, including diet (rich in fruits, vegetables and lean meats and low in salt and simple carbohydrates) and exercise (at least 30 minutes of moderate physical activity daily).  Patient to follow up 4-42m  Clarivel Callaway M. Jolinda, DO

## 2024-05-30 ENCOUNTER — Ambulatory Visit: Payer: Self-pay | Admitting: Family Medicine

## 2024-05-30 DIAGNOSIS — D696 Thrombocytopenia, unspecified: Secondary | ICD-10-CM

## 2024-05-30 DIAGNOSIS — E875 Hyperkalemia: Secondary | ICD-10-CM

## 2024-05-30 LAB — CBC
Hematocrit: 50.4 % (ref 37.5–51.0)
Hemoglobin: 15.7 g/dL (ref 13.0–17.7)
MCH: 29.5 pg (ref 26.6–33.0)
MCHC: 31.2 g/dL — ABNORMAL LOW (ref 31.5–35.7)
MCV: 95 fL (ref 79–97)
Platelets: 129 10*3/uL — ABNORMAL LOW (ref 150–450)
RBC: 5.32 x10E6/uL (ref 4.14–5.80)
RDW: 12.7 % (ref 11.6–15.4)
WBC: 7.2 10*3/uL (ref 3.4–10.8)

## 2024-05-30 LAB — CMP14+EGFR
ALT: 16 IU/L (ref 0–44)
AST: 20 IU/L (ref 0–40)
Albumin: 4.2 g/dL (ref 3.7–4.7)
Alkaline Phosphatase: 86 IU/L (ref 44–121)
BUN/Creatinine Ratio: 25 — ABNORMAL HIGH (ref 10–24)
BUN: 22 mg/dL (ref 8–27)
Bilirubin Total: 0.5 mg/dL (ref 0.0–1.2)
CO2: 21 mmol/L (ref 20–29)
Calcium: 9.3 mg/dL (ref 8.6–10.2)
Chloride: 101 mmol/L (ref 96–106)
Creatinine, Ser: 0.87 mg/dL (ref 0.76–1.27)
Globulin, Total: 2.3 g/dL (ref 1.5–4.5)
Glucose: 127 mg/dL — ABNORMAL HIGH (ref 70–99)
Potassium: 5.6 mmol/L — ABNORMAL HIGH (ref 3.5–5.2)
Sodium: 137 mmol/L (ref 134–144)
Total Protein: 6.5 g/dL (ref 6.0–8.5)
eGFR: 87 mL/min/{1.73_m2} (ref >59)

## 2024-05-30 LAB — MICROALBUMIN / CREATININE URINE RATIO
Creatinine, Urine: 54.2 mg/dL
Microalb/Creat Ratio: 46 mg/g{creat} — ABNORMAL HIGH (ref 0–29)
Microalbumin, Urine: 25.2 ug/mL

## 2024-05-30 LAB — LIPID PANEL
Cholesterol, Total: 111 mg/dL (ref 100–199)
HDL: 52 mg/dL (ref >39)
LDL CALC COMMENT:: 2.1 ratio (ref 0.0–5.0)
LDL Chol Calc (NIH): 46 mg/dL (ref 0–99)
Triglycerides: 56 mg/dL (ref 0–149)
VLDL Cholesterol Cal: 13 mg/dL (ref 5–40)

## 2024-05-30 LAB — PSA: Prostate Specific Ag, Serum: 2.4 ng/mL (ref 0.0–4.0)

## 2024-05-31 ENCOUNTER — Other Ambulatory Visit

## 2024-05-31 DIAGNOSIS — E875 Hyperkalemia: Secondary | ICD-10-CM

## 2024-05-31 DIAGNOSIS — D696 Thrombocytopenia, unspecified: Secondary | ICD-10-CM | POA: Diagnosis not present

## 2024-05-31 LAB — CBC WITH DIFFERENTIAL/PLATELET
Basophils Absolute: 0 10*3/uL (ref 0.0–0.2)
Basos: 0 %
EOS (ABSOLUTE): 0.1 10*3/uL (ref 0.0–0.4)
Eos: 1 %
Hematocrit: 48.1 % (ref 37.5–51.0)
Hemoglobin: 15.4 g/dL (ref 13.0–17.7)
Immature Grans (Abs): 0 10*3/uL (ref 0.0–0.1)
Immature Granulocytes: 0 %
Lymphocytes Absolute: 1.4 10*3/uL (ref 0.7–3.1)
Lymphs: 23 %
MCH: 30.1 pg (ref 26.6–33.0)
MCHC: 32 g/dL (ref 31.5–35.7)
MCV: 94 fL (ref 79–97)
Monocytes Absolute: 0.5 10*3/uL (ref 0.1–0.9)
Monocytes: 8 %
Neutrophils Absolute: 4.2 10*3/uL (ref 1.4–7.0)
Neutrophils: 68 %
Platelets: 129 10*3/uL — ABNORMAL LOW (ref 150–450)
RBC: 5.12 x10E6/uL (ref 4.14–5.80)
RDW: 12.5 % (ref 11.6–15.4)
WBC: 6.2 10*3/uL (ref 3.4–10.8)

## 2024-05-31 LAB — BASIC METABOLIC PANEL WITH GFR
BUN/Creatinine Ratio: 19 (ref 10–24)
BUN: 17 mg/dL (ref 8–27)
CO2: 22 mmol/L (ref 20–29)
Calcium: 9.3 mg/dL (ref 8.6–10.2)
Chloride: 101 mmol/L (ref 96–106)
Creatinine, Ser: 0.89 mg/dL (ref 0.76–1.27)
Glucose: 109 mg/dL — ABNORMAL HIGH (ref 70–99)
Potassium: 4.8 mmol/L (ref 3.5–5.2)
Sodium: 138 mmol/L (ref 134–144)
eGFR: 86 mL/min/{1.73_m2} (ref >59)

## 2024-06-01 ENCOUNTER — Ambulatory Visit: Payer: Self-pay | Admitting: Family Medicine

## 2024-06-01 ENCOUNTER — Other Ambulatory Visit: Payer: Self-pay | Admitting: Family Medicine

## 2024-06-01 ENCOUNTER — Encounter: Payer: Self-pay | Admitting: Family Medicine

## 2024-06-01 DIAGNOSIS — D696 Thrombocytopenia, unspecified: Secondary | ICD-10-CM

## 2024-06-18 ENCOUNTER — Inpatient Hospital Stay

## 2024-06-18 ENCOUNTER — Encounter: Payer: Self-pay | Admitting: Oncology

## 2024-06-18 ENCOUNTER — Inpatient Hospital Stay: Attending: Oncology | Admitting: Oncology

## 2024-06-18 VITALS — BP 130/83 | HR 76 | Temp 97.9°F | Resp 18 | Ht 66.0 in | Wt 178.4 lb

## 2024-06-18 DIAGNOSIS — Z87891 Personal history of nicotine dependence: Secondary | ICD-10-CM | POA: Insufficient documentation

## 2024-06-18 DIAGNOSIS — D696 Thrombocytopenia, unspecified: Secondary | ICD-10-CM | POA: Insufficient documentation

## 2024-06-18 DIAGNOSIS — J449 Chronic obstructive pulmonary disease, unspecified: Secondary | ICD-10-CM | POA: Insufficient documentation

## 2024-06-18 DIAGNOSIS — D6489 Other specified anemias: Secondary | ICD-10-CM

## 2024-06-18 DIAGNOSIS — Z79899 Other long term (current) drug therapy: Secondary | ICD-10-CM | POA: Diagnosis not present

## 2024-06-18 LAB — IRON AND TIBC
Iron: 54 ug/dL (ref 45–182)
Saturation Ratios: 15 % — ABNORMAL LOW (ref 17.9–39.5)
TIBC: 364 ug/dL (ref 250–450)
UIBC: 310 ug/dL

## 2024-06-18 LAB — CMP (CANCER CENTER ONLY)
ALT: 11 U/L (ref 0–44)
AST: 18 U/L (ref 15–41)
Albumin: 4 g/dL (ref 3.5–5.0)
Alkaline Phosphatase: 80 U/L (ref 38–126)
Anion gap: 11 (ref 5–15)
BUN: 19 mg/dL (ref 8–23)
CO2: 24 mmol/L (ref 22–32)
Calcium: 9.4 mg/dL (ref 8.9–10.3)
Chloride: 103 mmol/L (ref 98–111)
Creatinine: 0.96 mg/dL (ref 0.61–1.24)
GFR, Estimated: >60 mL/min (ref >60)
Glucose, Bld: 94 mg/dL (ref 70–99)
Potassium: 4.7 mmol/L (ref 3.5–5.1)
Sodium: 137 mmol/L (ref 135–145)
Total Bilirubin: 0.3 mg/dL (ref 0.0–1.2)
Total Protein: 7 g/dL (ref 6.5–8.1)

## 2024-06-18 LAB — APTT: aPTT: 39 s — ABNORMAL HIGH (ref 24–36)

## 2024-06-18 LAB — LACTATE DEHYDROGENASE: LDH: 194 U/L — ABNORMAL HIGH (ref 98–192)

## 2024-06-18 LAB — CBC WITH DIFFERENTIAL (CANCER CENTER ONLY)
Abs Immature Granulocytes: 0.02 K/uL (ref 0.00–0.07)
Basophils Absolute: 0 K/uL (ref 0.0–0.1)
Basophils Relative: 0 %
Eosinophils Absolute: 0.1 K/uL (ref 0.0–0.5)
Eosinophils Relative: 1 %
HCT: 48.1 % (ref 39.0–52.0)
Hemoglobin: 15 g/dL (ref 13.0–17.0)
Immature Granulocytes: 0 %
Lymphocytes Relative: 24 %
Lymphs Abs: 1.4 K/uL (ref 0.7–4.0)
MCH: 29.8 pg (ref 26.0–34.0)
MCHC: 31.2 g/dL (ref 30.0–36.0)
MCV: 95.4 fL (ref 80.0–100.0)
Monocytes Absolute: 0.5 K/uL (ref 0.1–1.0)
Monocytes Relative: 9 %
Neutro Abs: 3.8 K/uL (ref 1.7–7.7)
Neutrophils Relative %: 66 %
Platelet Count: 152 K/uL (ref 150–400)
RBC: 5.04 MIL/uL (ref 4.22–5.81)
RDW: 13.8 % (ref 11.5–15.5)
WBC Count: 5.9 K/uL (ref 4.0–10.5)
nRBC: 0 % (ref 0.0–0.2)

## 2024-06-18 LAB — PROTIME-INR
INR: 1 (ref 0.8–1.2)
Prothrombin Time: 13.8 s (ref 11.4–15.2)

## 2024-06-18 LAB — FERRITIN: Ferritin: 73 ng/mL (ref 24–336)

## 2024-06-18 LAB — TSH: TSH: 1.3 u[IU]/mL (ref 0.350–4.500)

## 2024-06-18 LAB — FOLATE: Folate: 10.4 ng/mL (ref >5.9)

## 2024-06-18 LAB — VITAMIN B12: Vitamin B-12: 167 pg/mL — ABNORMAL LOW (ref 180–914)

## 2024-06-18 NOTE — Assessment & Plan Note (Signed)
 COPD managed with Trelegy and ProAir  inhalers. Uses oxygen  at home at 2.5 to 3 liters as needed.

## 2024-06-18 NOTE — Assessment & Plan Note (Addendum)
 Mild thrombocytopenia with a recent platelet count of 129,000, slightly below the normal range. No significant bleeding risk at this level. Differential diagnosis includes medication effects, vitamin deficiencies, recent infection, or mild ITP.   No evidence of bone marrow disorder as white and red blood cell counts are normal.   No recent antibiotic use or new medications contributing to thrombocytopenia.   Pneumonia/sepsis and antibiotic use in March may correlate with onset. No significant bleeding symptoms apart from easy bruising and thin skin, possibly related to steroid use.  Repeat blood test today actually showed normal platelet count of 152,000.  White count and hemoglobin are within normal limits.  Ferritin, TSH, INR, PTT were all within normal limits.  I discussed results over the phone with the patient's wife.  Will follow-up on the remaining workup, including B12, folate, ANA.  Mild transient thrombocytopenia could be from myelosuppression from recent infection.  Will monitor for now.  - Schedule follow-up appointment in three months to monitor platelet count and trends.  If stable counts, he can be discharged from our office after next visit.

## 2024-06-18 NOTE — Progress Notes (Signed)
 [facility]  HEMATOLOGY CLINIC CONSULTATION NOTE   PATIENT NAME: Brent Snow   MR#: [id number] DOB: Jun 30, 1941  DATE OF SERVICE: 06/18/2024  REFERRING PHYSICIAN  Brent Norene CHRISTELLA, DO   Patient Care Team: Brent Norene CHRISTELLA, DO as PCP - General (Family Medicine) Brent Lamar RAMAN, MD as Consulting Physician (Pulmonary Disease) Snow Ade, MD as Consulting Physician (Physical Medicine and Rehabilitation) Brent Snow (Inactive) Brent Senior, MD as Consulting Physician (Urology) Brent Barter, MD as Referring Physician (Neurology)   REASON FOR CONSULTATION/ CHIEF COMPLAINT:  Thrombocytopenia    ASSESSMENT & PLAN:  Brent Snow is an 83 y.o. gentleman with a past medical history of COPD, on home oxygen , hypertension, OSA on CPAP, type 2 diabetes mellitus, history of esophageal stricture, was referred to our service for evaluation of thrombocytopenia.  Thrombocytopenia (HCC) Mild thrombocytopenia with a recent platelet count of 129,000, slightly below the normal range. No significant bleeding risk at this level. Differential diagnosis includes medication effects, vitamin deficiencies, recent infection, or mild ITP.   No evidence of bone marrow disorder as white and red blood cell counts are normal.   No recent antibiotic use or new medications contributing to thrombocytopenia.   Pneumonia/sepsis and antibiotic use in March may correlate with onset. No significant bleeding symptoms apart from easy bruising and thin skin, possibly related to steroid use.  Repeat blood test today actually showed normal platelet count of 152,000.  White count and hemoglobin are within normal limits.  Ferritin, TSH, INR, PTT were all within normal limits.  I discussed results over the phone with the patient's wife.  Will follow-up on the remaining workup, including B12, folate, ANA.  Mild transient thrombocytopenia could be from myelosuppression from  recent infection.  Will monitor for now.  - Schedule follow-up appointment in three months to monitor platelet count and trends.  If stable counts, he can be discharged from our office after next visit.  COPD (chronic obstructive pulmonary disease) (HCC) COPD managed with Trelegy and ProAir  inhalers. Uses oxygen  at home at 2.5 to 3 liters as needed.   I reviewed lab results and outside records for this visit and discussed relevant results with the patient. Diagnosis, plan of care and treatment options were also discussed in detail with the patient. Opportunity provided to ask questions and answers provided to his apparent satisfaction. Provided instructions to call our clinic with any problems, questions or concerns prior to return visit. I recommended to continue follow-up with PCP and sub-specialists. He verbalized understanding and agreed with the plan. No barriers to learning was detected.  Brent Patten, MD 06/18/2024 4:28 PM [facility] - A DEPT OF [facility] [address] [place] [address] Dept: [phone] Dept Fax: [phone]  HISTORY OF PRESENTING ILLNESS:   Discussed the use of AI scribe software for clinical note transcription with the patient, who gave verbal consent to proceed.  History of Present Illness Brent Snow is an 83 year old male with diabetes and COPD who presents with low platelet count. He is accompanied by his daughter, Brent Snow. He was referred by his primary doctor for evaluation of low platelet count.  He has a recent finding of low platelet count at 129,000, slightly below the normal range in June 2025. The low platelet count was identified after a hospital stay in March for high blood sugar and pneumonia, during which he was treated with insulin ,  antibiotics and steroids, including prednisone .  He has a history of diabetes, for which he was temporarily  on insulin  during his hospital stay in March, but is not currently using insulin . He also has COPD and uses Trelegy and ProAir  inhalers, as well as a nebulizer for breathing treatments, though infrequently. He uses home oxygen  at 2.5 to 3 liters as needed.  He experiences easy bruising and bleeding, particularly in his legs, which has been a longstanding issue. He attributes this to thin skin. No other bleeding issues such as epistaxis, gum bleeding, or melena. He is not on blood thinners or aspirin.  No recent use of antibiotics since March and infrequent use of Tylenol  products. No fevers, chills, or night sweats. No known thyroid  problems or history of clotting issues.    MEDICAL HISTORY Past Medical History:  Diagnosis Date   Arthritis    COPD, severe (HCC)    pulmologist-  dr byrum   Dyspnea on exertion    prescribed to use O2 via [place]--- per pt he does not use O2 he just take 1-2 puffs of rescue inhaler   Hiatal hernia    History of acute respiratory failure    02-18-2007  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   Hypertension    Hypoxemic respiratory failure, chronic (HCC)    pulmologist-  dr byrum-- prescribed supplemental O2 @ 2L via [place]    Lobar pneumonia (HCC) 02/27/2024   OSA on CPAP    and added O2 @ 2L via [place] w/ cpap   Phimosis    S/P balloon dilatation of esophageal stricture    multiple    Sepsis due to undetermined organism (HCC) 02/27/2024   Subdural hematoma (HCC)    in July '24 after fall   Type 2 diabetes mellitus (HCC)    followed by pcp--  per note uncontrolled w/ last A1c >14 on 12-30-2016   Wears contact lenses      SURGICAL HISTORY Past Surgical History:  Procedure Laterality Date   BACK SURGERY     CIRCUMCISION N/A 01/27/2017   Procedure: CIRCUMCISION ADULT;  Surgeon: Garnette Shack, MD;  Location: [facility];  Service: Urology;  Laterality: N/A;   KNEE ARTHROSCOPY Right 2008   LUMBAR SPINE SURGERY  1977  approx.    TONSILLECTOMY AND ADENOIDECTOMY  age 5   TRANSTHORACIC ECHOCARDIOGRAM  09/12/2015   mild LVH,  ef 65-70%,  grade 1 diastolic dysfunction/  mild RAE     SOCIAL HISTORY: He reports that he quit smoking about 17 years ago. His smoking use included cigarettes. He started smoking about 57 years ago. He has a 80 pack-year smoking history. He has never used smokeless tobacco. He reports current alcohol use. He reports that he does not use drugs. Social History   Socioeconomic History   Marital status: Married    Spouse name: Not on file   Number of children: 2   Years of education: 15   Highest education level: Some college, no degree  Occupational History   Occupation: Retired    Comment: Librarian, academic  Tobacco Use   Smoking status: Former    Current packs/day: 0.00    Average packs/day: 2.0 packs/day for 40.0 years (80.0 ttl pk-yrs)    Types: Cigarettes    Start date: 12/06/1966    Quit date: 12/06/2006    Years since quitting: 17.5   Smokeless tobacco: Never  Vaping Use   Vaping status: Never Used  Substance  and Sexual Activity   Alcohol use: Yes    Alcohol/week: 0.0 standard drinks of alcohol    Comment: VERY LITTLE   Drug use: No   Sexual activity: Not on file  Other Topics Concern   Not on file  Social History Narrative   Not on file   Social Drivers of Health   Financial Resource Strain: Low Risk  (12/23/2023)   Overall Financial Resource Strain (CARDIA)    Difficulty of Paying Living Expenses: Not hard at all  Food Insecurity: No Food Insecurity (06/18/2024)   Hunger Vital Sign    Worried About Running Out of Food in the Last Year: Never true    Ran Out of Food in the Last Year: Never true  Transportation Needs: No Transportation Needs (06/18/2024)   PRAPARE - Administrator, Civil Service (Medical): No    Lack of Transportation (Non-Medical): No  Physical Activity: Insufficiently Active (12/23/2023)   Exercise Vital Sign    Days of  Exercise per Week: 2 days    Minutes of Exercise per Session: 20 min  Stress: No Stress Concern Present (12/23/2023)   [employer] of Occupational Health - Occupational Stress Questionnaire    Feeling of Stress : Not at all  Social Connections: Socially Integrated (02/27/2024)   Social Connection and Isolation Panel    Frequency of Communication with Friends and Family: Twice a week    Frequency of Social Gatherings with Friends and Family: Once a week    Attends Religious Services: 1 to 4 times per year    Active Member of [employer] or Organizations: No    Attends Engineer, structural: 1 to 4 times per year    Marital Status: Married  Catering manager Violence: Not At Risk (06/18/2024)   Humiliation, Afraid, Rape, and Kick questionnaire    Fear of Current or Ex-Partner: No    Emotionally Abused: No    Physically Abused: No    Sexually Abused: No    FAMILY HISTORY: His family history includes Asthma in his mother; COPD in his mother; Cancer in his maternal grandmother; Early death (age of onset: 16) in his father.  CURRENT MEDICATIONS   Current Outpatient Medications  Medication Instructions   Albuterol  Sulfate (PROAIR  RESPICLICK) 108 (90 Base) MCG/ACT AEPB 2 puffs, Inhalation, Every 6 hours PRN   Blood Glucose Monitoring Suppl DEVI 1 each, Does not apply, 3 times daily, May substitute to any manufacturer covered by patient's insurance.   finasteride  (PROSCAR ) 5 mg, Oral, Daily   Fluticasone -Umeclidin-Vilant (TRELEGY ELLIPTA ) 100-62.5-25 MCG/ACT AEPB USE 1 INHALATION ORALLY    DAILY   ipratropium-albuterol  (DUONEB) 0.5-2.5 (3) MG/3ML SOLN 3 mLs, Nebulization, Every 6 hours   lidocaine  (LIDODERM ) 5 % PLACE 1 PATCH ONTO THE SKIN DAILY. REMOVE AND DISCARD PATCH WITHIN 12 HOURS OR AS DIRECTED BY MD   lisinopril  (ZESTRIL ) 10 mg, Oral, Daily   metFORMIN  (GLUCOPHAGE ) 500 mg, Daily   Ozempic  (0.25 or 0.5 MG/DOSE) 0.5 mg, Subcutaneous, Every 7 days   pravastatin  (PRAVACHOL ) 40 mg,  Oral, Daily   tamsulosin  (FLOMAX ) 0.4 mg, Oral, Daily     ALLERGIES  He has no known allergies.  REVIEW OF SYSTEMS:  Review of Systems - Oncology   Rest of the pertinent review of systems is unremarkable except as mentioned above in HPI.  PHYSICAL EXAMINATION:     Onc Performance Status - 06/18/24 1324       ECOG Perf Status   ECOG Perf Status Restricted in physically  strenuous activity but ambulatory and able to carry out work of a light or sedentary nature, e.g., light house work, office work      KPS SCALE   KPS % SCORE Able to carry on normal activity, minor s/s of disease          Vitals:   06/18/24 1317  BP: 130/83  Pulse: 76  Resp: 18  Temp: 97.9 F (36.6 C)  SpO2: 91%   Filed Weights   06/18/24 1317  Weight: 178 lb 6.4 oz (80.9 kg)    Physical Exam Constitutional:      General: He is not in acute distress.    Appearance: Normal appearance.  HENT:     Head: Normocephalic and atraumatic.  Eyes:     Conjunctiva/sclera: Conjunctivae normal.  Cardiovascular:     Rate and Rhythm: Normal rate and regular rhythm.  Pulmonary:     Effort: Pulmonary effort is normal. No respiratory distress.  Abdominal:     General: There is no distension.  Skin:    Comments: Scattered bruising on lower extremities  Neurological:     General: No focal deficit present.     Mental Status: He is alert and oriented to person, place, and time.  Psychiatric:        Mood and Affect: Mood normal.        Behavior: Behavior normal.      LABORATORY DATA:   I have reviewed the data as listed.  Results for orders placed or performed in visit on 06/18/24  APTT  Result Value Ref Range   aPTT 39 (H) 24 - 36 seconds  Protime-INR  Result Value Ref Range   Prothrombin Time 13.8 11.4 - 15.2 seconds   INR 1.0 0.8 - 1.2  TSH  Result Value Ref Range   TSH 1.300 0.350 - 4.500 uIU/mL  Ferritin  Result Value Ref Range   Ferritin 73 24 - 336 ng/mL  CBC with Differential  (Cancer Center Only)  Result Value Ref Range   WBC Count 5.9 4.0 - 10.5 K/uL   RBC 5.04 4.22 - 5.81 MIL/uL   Hemoglobin 15.0 13.0 - 17.0 g/dL   HCT 51.8 60.9 - 47.9 %   MCV 95.4 80.0 - 100.0 fL   MCH 29.8 26.0 - 34.0 pg   MCHC 31.2 30.0 - 36.0 g/dL   RDW 86.1 88.4 - 84.4 %   Platelet Count 152 150 - 400 K/uL   nRBC 0.0 0.0 - 0.2 %   Neutrophils Relative % 66 %   Neutro Abs 3.8 1.7 - 7.7 K/uL   Lymphocytes Relative 24 %   Lymphs Abs 1.4 0.7 - 4.0 K/uL   Monocytes Relative 9 %   Monocytes Absolute 0.5 0.1 - 1.0 K/uL   Eosinophils Relative 1 %   Eosinophils Absolute 0.1 0.0 - 0.5 K/uL   Basophils Relative 0 %   Basophils Absolute 0.0 0.0 - 0.1 K/uL   Immature Granulocytes 0 %   Abs Immature Granulocytes 0.02 0.00 - 0.07 K/uL    RADIOGRAPHIC STUDIES:  No pertinent imaging studies available to review.  Orders Placed This Encounter  Procedures   CBC with Differential (Cancer Center Only)    Standing Status:   Future    Number of Occurrences:   1    Expiration Date:   06/18/2025   CMP (Cancer Center only)    Standing Status:   Future    Number of Occurrences:   1    Expiration Date:  06/18/2025   Lactate dehydrogenase    Standing Status:   Future    Number of Occurrences:   1    Expiration Date:   06/18/2025   Iron and TIBC    Standing Status:   Future    Number of Occurrences:   1    Expiration Date:   06/18/2025   Ferritin    Standing Status:   Future    Number of Occurrences:   1    Expiration Date:   06/18/2025   Vitamin B12    Standing Status:   Future    Number of Occurrences:   1    Expiration Date:   06/18/2025   Folate    Standing Status:   Future    Number of Occurrences:   1    Expiration Date:   06/18/2025   TSH    Standing Status:   Future    Number of Occurrences:   1    Expiration Date:   06/18/2025   ANA w/Reflex if Positive    Standing Status:   Future    Number of Occurrences:   1    Expiration Date:   06/18/2025   Protime-INR    Standing  Status:   Future    Number of Occurrences:   1    Expiration Date:   06/18/2025   APTT    Standing Status:   Future    Number of Occurrences:   1    Expiration Date:   06/18/2025    Future Appointments  Date Time Provider Department Center  09/18/2024 11:00 AM [facility] PHLEBOTOMIST [facility] None  09/18/2024 11:30 AM Fahd Galea, Ch[place], MD [facility] None  12/11/2024  9:30 AM Brent Norene HERO, DO [facility] None  03/12/2025 10:15 AM Brent Senior, MD [facility] None  06/05/2025  9:00 AM Brent Norene HERO, DO [facility] None    I spent a total of 40 minutes during this encounter with the patient including review of chart and various tests results, discussions about plan of care and coordination of care plan.  This document was completed utilizing speech recognition software. Grammatical errors, random word insertions, pronoun errors, and incomplete sentences are an occasional consequence of this system due to software limitations, ambient noise, and hardware issues. Any formal questions or concerns about the content, text or information contained within the body of this dictation should be directly addressed to the provider for clarification.

## 2024-06-19 LAB — ANA W/REFLEX IF POSITIVE: Anti Nuclear Antibody (ANA): NEGATIVE

## 2024-07-04 ENCOUNTER — Other Ambulatory Visit: Payer: Self-pay | Admitting: Family Medicine

## 2024-07-04 DIAGNOSIS — J449 Chronic obstructive pulmonary disease, unspecified: Secondary | ICD-10-CM

## 2024-08-08 ENCOUNTER — Other Ambulatory Visit: Payer: Self-pay | Admitting: Family Medicine

## 2024-08-08 DIAGNOSIS — J449 Chronic obstructive pulmonary disease, unspecified: Secondary | ICD-10-CM

## 2024-08-08 DIAGNOSIS — E1169 Type 2 diabetes mellitus with other specified complication: Secondary | ICD-10-CM

## 2024-08-08 DIAGNOSIS — E1159 Type 2 diabetes mellitus with other circulatory complications: Secondary | ICD-10-CM

## 2024-08-14 DIAGNOSIS — M5416 Radiculopathy, lumbar region: Secondary | ICD-10-CM | POA: Diagnosis not present

## 2024-08-14 DIAGNOSIS — M51362 Other intervertebral disc degeneration, lumbar region with discogenic back pain and lower extremity pain: Secondary | ICD-10-CM | POA: Diagnosis not present

## 2024-08-14 DIAGNOSIS — Z9889 Other specified postprocedural states: Secondary | ICD-10-CM | POA: Diagnosis not present

## 2024-09-03 ENCOUNTER — Other Ambulatory Visit (HOSPITAL_COMMUNITY): Payer: Self-pay

## 2024-09-11 ENCOUNTER — Telehealth: Payer: Self-pay | Admitting: Oncology

## 2024-09-11 NOTE — Telephone Encounter (Signed)
 PT feels like he doesn't need the appt and would like to cancel.

## 2024-09-18 ENCOUNTER — Ambulatory Visit: Admitting: Oncology

## 2024-09-18 ENCOUNTER — Other Ambulatory Visit

## 2024-09-25 ENCOUNTER — Ambulatory Visit

## 2024-09-25 DIAGNOSIS — Z23 Encounter for immunization: Secondary | ICD-10-CM

## 2024-10-02 ENCOUNTER — Encounter: Payer: Self-pay | Admitting: Family Medicine

## 2024-10-02 ENCOUNTER — Other Ambulatory Visit: Payer: Self-pay

## 2024-10-02 DIAGNOSIS — E1169 Type 2 diabetes mellitus with other specified complication: Secondary | ICD-10-CM

## 2024-10-03 MED ORDER — METFORMIN HCL 500 MG PO TABS: 500.0000 mg | ORAL_TABLET | Freq: Every day | ORAL | 3 refills | Status: AC

## 2024-10-14 ENCOUNTER — Other Ambulatory Visit: Payer: Self-pay | Admitting: *Deleted

## 2024-10-14 ENCOUNTER — Other Ambulatory Visit: Payer: Self-pay | Admitting: Emergency Medicine

## 2024-10-14 DIAGNOSIS — J449 Chronic obstructive pulmonary disease, unspecified: Secondary | ICD-10-CM

## 2024-10-17 DIAGNOSIS — L814 Other melanin hyperpigmentation: Secondary | ICD-10-CM | POA: Diagnosis not present

## 2024-10-17 DIAGNOSIS — L821 Other seborrheic keratosis: Secondary | ICD-10-CM | POA: Diagnosis not present

## 2024-10-17 DIAGNOSIS — Z85820 Personal history of malignant melanoma of skin: Secondary | ICD-10-CM | POA: Diagnosis not present

## 2024-10-17 DIAGNOSIS — L570 Actinic keratosis: Secondary | ICD-10-CM | POA: Diagnosis not present

## 2024-10-17 DIAGNOSIS — D485 Neoplasm of uncertain behavior of skin: Secondary | ICD-10-CM | POA: Diagnosis not present

## 2024-10-17 DIAGNOSIS — C44519 Basal cell carcinoma of skin of other part of trunk: Secondary | ICD-10-CM | POA: Diagnosis not present

## 2024-10-17 DIAGNOSIS — Z85828 Personal history of other malignant neoplasm of skin: Secondary | ICD-10-CM | POA: Diagnosis not present

## 2024-10-17 DIAGNOSIS — I781 Nevus, non-neoplastic: Secondary | ICD-10-CM | POA: Diagnosis not present

## 2024-11-04 ENCOUNTER — Other Ambulatory Visit: Payer: Self-pay | Admitting: Emergency Medicine

## 2024-11-04 DIAGNOSIS — J449 Chronic obstructive pulmonary disease, unspecified: Secondary | ICD-10-CM

## 2024-11-07 ENCOUNTER — Telehealth: Payer: Self-pay | Admitting: Pharmacy Technician

## 2024-11-07 ENCOUNTER — Other Ambulatory Visit (HOSPITAL_COMMUNITY): Payer: Self-pay

## 2024-11-07 NOTE — Telephone Encounter (Signed)
 Pharmacy Patient Advocate Encounter   Received notification from Onbase that prior authorization for Ozempic  (0.25 or 0.5 MG/DOSE) 2MG /3ML pen-injectors is required/requested.   Insurance verification completed.   The patient is insured through [facility].   Per test claim: PA required; PA started via CoverMyMeds. KEY [id number] . Waiting for clinical questions to populate.

## 2024-11-07 NOTE — Telephone Encounter (Signed)
 Pharmacy Patient Advocate Encounter  Received notification from [facility] that Prior Authorization for Ozempic  (0.25 or 0.5 MG/DOSE) 2MG /3ML pen-injectors has been APPROVED from 11/07/24 to 11/07/25. Unable to obtain price due to refill too soon rejection, last fill date 10/14/24 next available fill date 12/16/24   PA #/Case ID/Reference #: [id number]

## 2024-11-08 NOTE — Telephone Encounter (Signed)
Pt notified.    LS

## 2024-11-13 DIAGNOSIS — M51362 Other intervertebral disc degeneration, lumbar region with discogenic back pain and lower extremity pain: Secondary | ICD-10-CM | POA: Diagnosis not present

## 2024-11-13 DIAGNOSIS — Z9889 Other specified postprocedural states: Secondary | ICD-10-CM | POA: Diagnosis not present

## 2024-11-13 DIAGNOSIS — M5416 Radiculopathy, lumbar region: Secondary | ICD-10-CM | POA: Diagnosis not present

## 2024-11-24 LAB — OPHTHALMOLOGY REPORT-SCANNED

## 2024-12-11 ENCOUNTER — Encounter: Payer: Self-pay | Admitting: Family Medicine

## 2024-12-11 ENCOUNTER — Ambulatory Visit (INDEPENDENT_AMBULATORY_CARE_PROVIDER_SITE_OTHER): Payer: Self-pay | Admitting: Family Medicine

## 2024-12-11 VITALS — BP 119/68 | HR 61 | Temp 97.6°F | Ht 66.0 in | Wt 173.0 lb

## 2024-12-11 DIAGNOSIS — E538 Deficiency of other specified B group vitamins: Secondary | ICD-10-CM | POA: Diagnosis not present

## 2024-12-11 DIAGNOSIS — E1159 Type 2 diabetes mellitus with other circulatory complications: Secondary | ICD-10-CM

## 2024-12-11 DIAGNOSIS — E1169 Type 2 diabetes mellitus with other specified complication: Secondary | ICD-10-CM

## 2024-12-11 DIAGNOSIS — E785 Hyperlipidemia, unspecified: Secondary | ICD-10-CM

## 2024-12-11 DIAGNOSIS — I152 Hypertension secondary to endocrine disorders: Secondary | ICD-10-CM | POA: Diagnosis not present

## 2024-12-11 LAB — VITAMIN B12: Vitamin B-12: 309 pg/mL (ref 232–1245)

## 2024-12-11 LAB — BAYER DCA HB A1C WAIVED: HB A1C (BAYER DCA - WAIVED): 6 % — ABNORMAL HIGH (ref 4.8–5.6)

## 2024-12-11 LAB — BASIC METABOLIC PANEL WITH GFR
BUN/Creatinine Ratio: 23 (ref 10–24)
BUN: 16 mg/dL (ref 8–27)
CO2: 26 mmol/L (ref 20–29)
Calcium: 9.2 mg/dL (ref 8.6–10.2)
Chloride: 102 mmol/L (ref 96–106)
Creatinine, Ser: 0.71 mg/dL — ABNORMAL LOW (ref 0.76–1.27)
Glucose: 108 mg/dL — ABNORMAL HIGH (ref 70–99)
Potassium: 5.2 mmol/L (ref 3.5–5.2)
Sodium: 140 mmol/L (ref 134–144)
eGFR: 92 mL/min/1.73

## 2024-12-11 NOTE — Progress Notes (Signed)
 "  Subjective: CC:DM PCP: Jolinda Norene HERO, DO Brent Snow is a 84 y.o. male presenting to clinic today for:  Type 2 Diabetes with hypertension, hyperlipidemia:  Compliant with medications.  No reports of hypoglycemia.  Had some vision changes but was found to have astigmatism by his eye doctor and they just got new glasses for him.  Reports balance and gait has been better after having corticosteroid injection done at the beginning of December of last year.  Denies any chest pain, shortness of breath, edema.   Diabetes Health Maintenance Due  Topic Date Due   OPHTHALMOLOGY EXAM  06/11/2024   HEMOGLOBIN A1C  11/28/2024   FOOT EXAM  05/29/2025    ROS: Per HPI  Allergies[1] Past Medical History:  Diagnosis Date   Arthritis    Closed fracture of parietal bone (HCC) 12/03/2023   COPD, severe (HCC)    pulmologist-  dr byrum   Dyspnea on exertion    prescribed to use O2 via [place]--- per pt he does not use O2 he just take 1-2 puffs of rescue inhaler   Hiatal hernia    History of acute respiratory failure    02-18-2007  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   Hypertension    Hypoxemic respiratory failure, chronic (HCC)    pulmologist-  dr byrum-- prescribed supplemental O2 @ 2L via [place]    Lobar pneumonia 02/27/2024   OSA on CPAP    and added O2 @ 2L via [place] w/ cpap   Phimosis    S/P balloon dilatation of esophageal stricture    multiple    Sepsis due to undetermined organism (HCC) 02/27/2024   Subdural hematoma (HCC)    in July '24 after fall   Type 2 diabetes mellitus (HCC)    followed by pcp--  per note uncontrolled w/ last A1c >14 on 12-30-2016   Wears contact lenses    Current Medications[2] Social History   Socioeconomic History   Marital status: Married    Spouse name: Not on file   Number of children: 2   Years of education: 15   Highest education level: Some college, no degree  Occupational History   Occupation: Retired    Comment:  Librarian, Academic  Tobacco Use   Smoking status: Former    Current packs/day: 0.00    Average packs/day: 2.0 packs/day for 40.0 years (80.0 ttl pk-yrs)    Types: Cigarettes    Start date: 12/06/1966    Quit date: 12/06/2006    Years since quitting: 18.0   Smokeless tobacco: Never  Vaping Use   Vaping status: Never Used  Substance and Sexual Activity   Alcohol use: Yes    Alcohol/week: 0.0 standard drinks of alcohol    Comment: VERY LITTLE   Drug use: No   Sexual activity: Not on file  Other Topics Concern   Not on file  Social History Narrative   Not on file   Social Drivers of Health   Tobacco Use: Medium Risk (12/11/2024)   Patient History    Smoking Tobacco Use: Former    Smokeless Tobacco Use: Never    Passive Exposure: Not on file  Financial Resource Strain: Low Risk (12/23/2023)   Overall Financial Resource Strain (CARDIA)    Difficulty of Paying Living Expenses: Not hard at all  Food Insecurity: No Food Insecurity (06/18/2024)   Epic    Worried About Radiation Protection Practitioner of Food in the Last Year: Never true  Ran Out of Food in the Last Year: Never true  Transportation Needs: No Transportation Needs (06/18/2024)   Epic    Lack of Transportation (Medical): No    Lack of Transportation (Non-Medical): No  Physical Activity: Insufficiently Active (12/23/2023)   Exercise Vital Sign    Days of Exercise per Week: 2 days    Minutes of Exercise per Session: 20 min  Stress: No Stress Concern Present (12/23/2023)   [employer] of Occupational Health - Occupational Stress Questionnaire    Feeling of Stress : Not at all  Social Connections: Socially Integrated (02/27/2024)   Social Connection and Isolation Panel    Frequency of Communication with Friends and Family: Twice a week    Frequency of Social Gatherings with Friends and Family: Once a week    Attends Religious Services: 1 to 4 times per year    Active Member of Clubs or Organizations: No    Attends Occupational Hygienist Meetings: 1 to 4 times per year    Marital Status: Married  Catering Manager Violence: Not At Risk (06/18/2024)   Epic    Fear of Current or Ex-Partner: No    Emotionally Abused: No    Physically Abused: No    Sexually Abused: No  Depression (PHQ2-9): Low Risk (06/18/2024)   Depression (PHQ2-9)    PHQ-2 Score: 0  Alcohol Screen: Low Risk (12/23/2023)   Alcohol Screen    Last Alcohol Screening Score (AUDIT): 0  Housing: Unknown (06/18/2024)   Epic    Unable to Pay for Housing in the Last Year: No    Number of Times Moved in the Last Year: Not on file    Homeless in the Last Year: No  Utilities: Not At Risk (06/18/2024)   Epic    Threatened with loss of utilities: No  Health Literacy: Adequate Health Literacy (12/23/2023)   B1300 Health Literacy    Frequency of need for help with medical instructions: Never   Family History  Problem Relation Age of Onset   Asthma Mother    COPD Mother    Cancer Maternal Grandmother        stomach   Early death Father 24       Tractor Accident    Objective: Office vital signs reviewed. BP 119/68   Pulse 61   Temp 97.6 F (36.4 C)   Ht 5' 6 (1.676 m)   Wt 173 lb (78.5 kg)   SpO2 95%   BMI 27.92 kg/m   Physical Examination:  General: Awake, alert, well nourished, No acute distress HEENT: sclera white, MMM Cardio: regular rate and rhythm, S1S2 heard, soft murmurs appreciated Pulm: clear to auscultation bilaterally, no wheezes, rhonchi or rales; normal work of breathing on room air GI: soft, non-tender, non-distended, bowel sounds present x4, no hepatomegaly, no splenomegaly, no masses   Lab Results  Component Value Date   HGBA1C 6.6 (H) 05/29/2024    Assessment/ Plan: 84 y.o. male   Type 2 diabetes mellitus with other specified complication, without long-term current use of insulin  (HCC) - Plan: Bayer DCA Hb A1c Waived, Basic Metabolic Panel  Hypertension associated with diabetes (HCC) - Plan: Basic Metabolic  Panel  Hyperlipidemia associated with type 2 diabetes mellitus (HCC)  Vitamin B12 deficiency - Plan: Vitamin B12  Check A1c, renal function.  Repeat blood pressure acceptable  Continue statin.  Recheck B12 level as this was noted to be deficient when checked by his specialist last year.  Discussed B12 supplementation.  Follow-up  6 months for annual physical fasting labs  Recommended tetanus shot.  He is considering   Norene CHRISTELLA Fielding, DO [facility] [phone]     [1] No Known Allergies [2]  Current Outpatient Medications:    Albuterol  Sulfate (PROAIR  RESPICLICK) 108 (90 Base) MCG/ACT AEPB, INHALE 2 PUFFS INTO LUNGS EVERY 6 HOURS AS NEEDED, Disp: 1 each, Rfl: 5   Blood Glucose Monitoring Suppl DEVI, 1 each by Does not apply route in the morning, at noon, and at bedtime. May substitute to any manufacturer covered by patient's insurance., Disp: 1 each, Rfl: 0   finasteride  (PROSCAR ) 5 MG tablet, Take 1 tablet (5 mg total) by mouth daily., Disp: 90 tablet, Rfl: 4   Fluticasone -Umeclidin-Vilant (TRELEGY ELLIPTA ) 100-62.5-25 MCG/ACT AEPB, USE 1 INHALATION ORALLY    DAILY, Disp: 180 each, Rfl: 3   ipratropium-albuterol  (DUONEB) 0.5-2.5 (3) MG/3ML SOLN, Take 3 mLs by nebulization every 6 (six) hours., Disp: 360 mL, Rfl: 1   lisinopril  (ZESTRIL ) 10 MG tablet, TAKE 1 TABLET DAILY, Disp: 90 tablet, Rfl: 1   metFORMIN  (GLUCOPHAGE ) 500 MG tablet, Take 1 tablet (500 mg total) by mouth daily., Disp: 100 tablet, Rfl: 3   pravastatin  (PRAVACHOL ) 40 MG tablet, TAKE 1 TABLET DAILY, Disp: 90 tablet, Rfl: 1   Semaglutide ,0.25 or 0.5MG /DOS, (OZEMPIC , 0.25 OR 0.5 MG/DOSE,) 2 MG/3ML SOPN, Inject 0.5 mg into the skin every 7 (seven) days., Disp: 9 mL, Rfl: 3   tamsulosin  (FLOMAX ) 0.4 MG CAPS capsule, Take 1 capsule (0.4 mg total) by mouth daily., Disp: 90 capsule, Rfl: 4   lidocaine  (LIDODERM ) 5 %, PLACE 1 PATCH ONTO THE SKIN DAILY. REMOVE AND DISCARD PATCH WITHIN 12 HOURS OR  AS DIRECTED BY MD (Patient not taking: Reported on 12/11/2024), Disp: 30 patch, Rfl: PRN  "

## 2024-12-11 NOTE — Patient Instructions (Signed)
 Recommend daily B12 supplement.  Your last labs showed deficiency.  Vitamin B12 Deficiency Vitamin B12 deficiency means that your body does not have enough vitamin B12. The body needs this important vitamin: To make red blood cells. To make genes (DNA). To help the nerves work. If you do not have enough vitamin B12 in your body, you can have health problems, such as not having enough red blood cells in the blood (anemia). What are the causes? Not eating enough foods that contain vitamin B12. Not being able to take in (absorb) vitamin B12 from the food that you eat. Certain diseases. A condition in which the body does not make enough of a certain protein. This results in your body not taking in enough vitamin B12. Having a surgery in which part of the stomach or small intestine is taken out. Taking medicines that make it hard for the body to take in vitamin B12. These include: Heartburn medicines. Some medicines that are used to treat diabetes. What increases the risk? Being an older adult. Eating a vegetarian or vegan diet that does not include any foods that come from animals. Not eating enough foods that contain vitamin B12 while you are pregnant. Taking certain medicines. Having alcoholism. What are the signs or symptoms? In some cases, there are no symptoms. If the condition leads to too few blood cells or nerve damage, symptoms can occur, such as: Feeling weak or tired. Not being hungry. Losing feeling (numbness) or tingling in your hands and feet. Redness and burning of the tongue. Feeling sad (depressed). Confusion or memory problems. Trouble walking. If anemia is very bad, symptoms can include: Being short of breath. Being dizzy. Having a very fast heartbeat. How is this treated? Changing the way you eat and drink, such as: Eating more foods that contain vitamin B12. Drinking little or no alcohol. Getting vitamin B12 shots. Taking vitamin B12 supplements by mouth  (orally). Your doctor will tell you the dose that is best for you. Follow these instructions at home: Eating and drinking  Eat foods that come from animals and have a lot of vitamin B12 in them. These include: Meats and poultry. This includes beef, pork, chicken, turkey, and organ meats, such as liver. Seafood, such as clams, rainbow trout, salmon, tuna, and haddock. Eggs. Dairy foods such as milk, yogurt, and cheese. Eat breakfast cereals that have vitamin B12 added to them (are fortified). Check the label. The items listed above may not be a complete list of foods and beverages you can eat and drink. Contact a dietitian for more information. Alcohol use Do not drink alcohol if: Your doctor tells you not to drink. You are pregnant, may be pregnant, or are planning to become pregnant. If you drink alcohol: Limit how much you have to: 0-1 drink a day for women. 0-2 drinks a day for men. Know how much alcohol is in your drink. In the [place] one drink equals one 12 oz bottle of beer (355 mL), one 5 oz glass of wine (148 mL), or one 1 oz glass of hard liquor (44 mL). General instructions Get any vitamin B12 shots if told by your doctor. Take supplements only as told by your doctor. Follow the directions. Keep all follow-up visits. Contact a doctor if: Your symptoms come back. Your symptoms get worse or do not get better with treatment. Get help right away if: You have trouble breathing. You have a very fast heartbeat. You have chest pain. You get dizzy. You faint. These  symptoms may be an emergency. Get help right away. Call 911. Do not wait to see if the symptoms will go away. Do not drive yourself to the hospital. Summary Vitamin B12 deficiency means that your body is not getting enough of the vitamin. In some cases, there are no symptoms of this condition. Treatment may include making a change in the way you eat and drink, getting shots, or taking supplements. Eat foods that  have vitamin B12 in them. This information is not intended to replace advice given to you by your health care provider. Make sure you discuss any questions you have with your health care provider. Document Revised: 07/17/2021 Document Reviewed: 07/17/2021 Elsevier Patient Education  2024 [employer].

## 2024-12-12 ENCOUNTER — Ambulatory Visit: Payer: Self-pay | Admitting: Family Medicine

## 2024-12-21 ENCOUNTER — Ambulatory Visit (INDEPENDENT_AMBULATORY_CARE_PROVIDER_SITE_OTHER): Admitting: Emergency Medicine

## 2024-12-21 ENCOUNTER — Encounter: Payer: Self-pay | Admitting: Emergency Medicine

## 2024-12-21 VITALS — BP 131/75 | HR 60 | Ht 66.0 in | Wt 172.0 lb

## 2024-12-21 DIAGNOSIS — R918 Other nonspecific abnormal finding of lung field: Secondary | ICD-10-CM

## 2024-12-21 DIAGNOSIS — J9611 Chronic respiratory failure with hypoxia: Secondary | ICD-10-CM | POA: Diagnosis not present

## 2024-12-21 DIAGNOSIS — G4733 Obstructive sleep apnea (adult) (pediatric): Secondary | ICD-10-CM | POA: Diagnosis not present

## 2024-12-21 DIAGNOSIS — J449 Chronic obstructive pulmonary disease, unspecified: Secondary | ICD-10-CM | POA: Diagnosis not present

## 2024-12-21 MED ORDER — PROAIR RESPICLICK 108 (90 BASE) MCG/ACT IN AEPB: 2.0000 | INHALATION_SPRAY | Freq: Four times a day (QID) | RESPIRATORY_TRACT | 5 refills | Status: AC | PRN

## 2024-12-21 MED ORDER — TRELEGY ELLIPTA 100-62.5-25 MCG/ACT IN AEPB: INHALATION_SPRAY | RESPIRATORY_TRACT | 3 refills | Status: AC

## 2024-12-21 NOTE — Assessment & Plan Note (Signed)
 No longer using CPAP.  He is using oxygen  at 3 L/min.  Plan to continue this.

## 2024-12-21 NOTE — Assessment & Plan Note (Signed)
 He has a benign right lower lobe mass lesion as well as some scattered pulmonary nodules.  Also with some tree-in-bud reticulonodular opacity both bases.  I did discuss with him the pros and cons of bronchoscopy for culture data to evaluate the tree-in-bud findings.  At this time I think he would poorly tolerate and given minimal symptoms we can defer.

## 2024-12-21 NOTE — Progress Notes (Signed)
 "  Subjective:    Patient ID: Brent Snow, male    DOB: 02-Apr-1941, 84 y.o.   MRN: [id number]  COPD He complains of shortness of breath. There is no cough or wheezing. Pertinent negatives include no ear pain, fever, headaches, postnasal drip, rhinorrhea, sneezing, sore throat or trouble swallowing. His past medical history is significant for COPD.    ROV 12/21/2024 --follow-up visit for 84 year old gentleman whom I have followed for very severe COPD and associated chronic hypoxemic respiratory failure.  He also has obstructive sleep apnea but has been unreliable with CPAP.  We have followed him for an abnormal CT chest with a 3.1 cm area of right lower lobe opacity apparently rounded atelectasis versus hamartoma, scattered pulmonary nodules and some tree-in-bud inflammatory change. He was admitted with a flare and PNA in 02/2024.  He has lost some wt, has been able to exert some. He wears O2 at night, with some exertion but not all. He has not seen any desats. He is not using CPAP, not snoring, does feel well rested in the am. Remains on Trelegy, rare duoneb, occas albuterol . Flu shot up to date.   CT scan of the chest 02/23/2024 reviewed by me, showed 1.7 cm right paratracheal node with some internal fat density that is unchanged, hiatal hernia, 3.2 x 2.2 cm posterior basal segment right lower lobe mass lesion that has not changed for over 10 years, benign.  There is a stable 5 mm right upper lobe pulmonary nodule, faint tree-in-bud reticulonodular opacities in the bilateral lower lobes                                                              Review of Systems  Constitutional:  Negative for fever and unexpected weight change.  HENT:  Negative for congestion, dental problem, ear pain, nosebleeds, postnasal drip, rhinorrhea, sinus pressure, sneezing, sore throat and trouble swallowing.   Eyes:  Negative for redness and itching.  Respiratory:  Positive for shortness of breath. Negative for  cough, chest tightness and wheezing.   Cardiovascular:  Negative for palpitations and leg swelling.  Gastrointestinal:  Negative for nausea and vomiting.  Genitourinary:  Negative for dysuria.  Musculoskeletal:  Negative for joint swelling.       Pain and stiffness  Skin:  Negative for rash.  Neurological:  Negative for headaches.  Hematological:  Does not bruise/bleed easily.  Psychiatric/Behavioral:  Negative for dysphoric mood and suicidal ideas. The patient is not nervous/anxious.       Objective:   Physical Exam Vitals:   12/21/24 1004  BP: (!) 149/85  Pulse: 60  SpO2: 93%  Weight: 172 lb (78 kg)  Height: 5' 6 (1.676 m)   Gen: Pleasant, overweight, in no distress,  normal affect  ENT: No lesions,  mouth clear,  oropharynx clear, no postnasal drip  Neck: No JVD, no stridor  Lungs: No use of accessory muscles, distant, no wheezing  Cardiovascular: RRR, heart sounds normal, no murmur or gallops, no peripheral edema  Musculoskeletal: No deformities, no cyanosis or clubbing  Neuro: alert, non focal  Skin: Warm, no lesions or rashes     Assessment & Plan:  COPD (chronic obstructive pulmonary disease) (HCC) Please continue your Trelegy once daily.  Rinse and gargle after using. Keep your  ProAir  available to use 2 puffs up to every 4 hours if needed for shortness of breath, chest tightness, wheezing. You can use your albuterol  ipratropium (DuoNeb) nebulizer treatment up to every 6 hours if you need for shortness of breath, chest tightness, wheezing. Keep your flu shot up-to-date Continue to work on your exercise and conditioning.  Obstructive sleep apnea No longer using CPAP.  He is using oxygen  at 3 L/min.  Plan to continue this.  Chronic hypoxic respiratory failure (HCC) He has not seen any significant desaturations with exertion although he acknowledges that he has not been very active.  Wears the supplemental oxygen  sporadically.  I did explain to him that our  goal is to keep his SpO2 > 90 percent and that he should use the oxygen  accordingly to avoid desaturations  Pulmonary nodules He has a benign right lower lobe mass lesion as well as some scattered pulmonary nodules.  Also with some tree-in-bud reticulonodular opacity both bases.  I did discuss with him the pros and cons of bronchoscopy for culture data to evaluate the tree-in-bud findings.  At this time I think he would poorly tolerate and given minimal symptoms we can defer.  I personally spent a total of 35 minutes in the care of the patient today including preparing to see the patient, getting/reviewing separately obtained history, performing a medically appropriate exam/evaluation, counseling and educating, referring and communicating with other health care professionals, documenting clinical information in the EHR, independently interpreting results, and communicating results.   Lamar Chris, MD, PhD 12/21/2024, 10:43 AM [facility] [phone] or if no answer [phone]  "

## 2024-12-21 NOTE — Assessment & Plan Note (Signed)
 Please continue your Trelegy once daily.  Rinse and gargle after using. Keep your ProAir  available to use 2 puffs up to every 4 hours if needed for shortness of breath, chest tightness, wheezing. You can use your albuterol  ipratropium (DuoNeb) nebulizer treatment up to every 6 hours if you need for shortness of breath, chest tightness, wheezing. Keep your flu shot up-to-date Continue to work on your exercise and conditioning.

## 2024-12-21 NOTE — Assessment & Plan Note (Signed)
 He has not seen any significant desaturations with exertion although he acknowledges that he has not been very active.  Wears the supplemental oxygen  sporadically.  I did explain to him that our goal is to keep his SpO2 > 90 percent and that he should use the oxygen  accordingly to avoid desaturations

## 2024-12-21 NOTE — Patient Instructions (Signed)
 Please continue your Trelegy once daily.  Rinse and gargle after using. Keep your ProAir  available to use 2 puffs up to every 4 hours if needed for shortness of breath, chest tightness, wheezing. You can use your albuterol  ipratropium (DuoNeb) nebulizer treatment up to every 6 hours if you need for shortness of breath, chest tightness, wheezing. Keep your flu shot up-to-date Continue to work on your exercise and conditioning. Wear your oxygen  at night at 3 L/min Wear your oxygen  with exertion at 2 L/min with a goal of keeping your saturations > 90% as reliably as possible. We discussed your CPAP today. We reviewed your CT scan of the chest from March.  We will not schedule any dedicated follow-up scans at this time. Follow-up in our office in 6 months.  Please call sooner if you have any problems.

## 2024-12-27 ENCOUNTER — Ambulatory Visit (INDEPENDENT_AMBULATORY_CARE_PROVIDER_SITE_OTHER)

## 2024-12-27 VITALS — BP 131/75 | HR 60 | Ht 69.0 in | Wt 172.0 lb

## 2024-12-27 DIAGNOSIS — Z0000 Encounter for general adult medical examination without abnormal findings: Secondary | ICD-10-CM

## 2024-12-27 NOTE — Progress Notes (Signed)
 "  Chief Complaint  Patient presents with   Medicare Wellness     Subjective:   Brent Snow is a 84 y.o. male who presents for a Medicare Annual Wellness Visit.  Visit info / Clinical Intake: Medicare Wellness Visit Type:: Subsequent Annual Wellness Visit Persons participating in visit and providing information:: patient Medicare Wellness Visit Mode:: Telephone If telephone:: video declined Since this visit was completed virtually, some vitals may be partially provided or unavailable. Missing vitals are due to the limitations of the virtual format.: Unable to obtain vitals - no equipment If Telephone or Video please confirm:: I connected with patient using audio/video enable telemedicine. I verified patient identity with two identifiers, discussed telehealth limitations, and patient agreed to proceed. Patient Location:: home Provider Location:: office Interpreter Needed?: No Pre-visit prep was completed: yes AWV questionnaire completed by patient prior to visit?: no Living arrangements:: lives with spouse/significant other Patient's Overall Health Status Rating: very good Typical amount of pain: none Does pain affect daily life?: no Are you currently prescribed opioids?: no  Dietary Habits and Nutritional Risks How many meals a day?: 3 Eats fruit and vegetables daily?: yes Most meals are obtained by: preparing own meals In the last 2 weeks, have you had any of the following?: unintentional weight loss (due to Ozempic ) Diabetic:: (!) yes Any non-healing wounds?: no How often do you check your BS?: -- (per pt not as much) Would you like to be referred to a Nutritionist or for Diabetic Management? : no  Functional Status Activities of Daily Living (to include ambulation/medication): Independent Ambulation: Independent Medication Administration: Independent Home Management (perform basic housework or laundry): Independent Manage your own finances?: yes Primary  transportation is: driving Concerns about vision?: no *vision screening is required for WTM* (last ov 2025) Concerns about hearing?: no  Fall Screening Falls in the past year?: 0 Number of falls in past year: 0 Was there an injury with Fall?: 0 Fall Risk Category Calculator: 0 Patient Fall Risk Level: Low Fall Risk  Fall Risk Patient at Risk for Falls Due to: No Fall Risks Fall risk Follow up: Falls evaluation completed; Education provided  Home and Transportation Safety: All rugs have non-skid backing?: yes All stairs or steps have railings?: yes Grab bars in the bathtub or shower?: yes Have non-skid surface in bathtub or shower?: yes Good home lighting?: yes Regular seat belt use?: yes Hospital stays in the last year:: (!) yes How many hospital stays:: 1 Reason: pos pneumonia?  Cognitive Assessment Difficulty concentrating, remembering, or making decisions? : yes (memory-sometimes) Will 6CIT or Mini Cog be Completed: yes What year is it?: 0 points What month is it?: 0 points Give patient an address phrase to remember (5 components): [address] [place] About what time is it?: 0 points Count backwards from 20 to 1: 0 points Say the months of the year in reverse: 0 points Repeat the address phrase from earlier: 0 points 6 CIT Score: 0 points  Advance Directives (For Healthcare) Does Patient Have a Medical Advance Directive?: No Does patient want to make changes to medical advance directive?: No - Patient declined Type of Advance Directive: Healthcare Power of [place]; Living will Copy of Healthcare Power of Attorney in Chart?: No - copy requested Copy of Living Will in Chart?: No - copy requested Would patient like information on creating a medical advance directive?: No - Patient declined  Reviewed/Updated  Reviewed/Updated: Reviewed All (Medical, Surgical, Family, Medications, Allergies, Care Teams, Patient Goals); Medical History;  Surgical History;  Family History; Medications; Allergies; Care Teams; Patient Goals    Allergies (verified) Patient has no known allergies.   Current Medications (verified) Outpatient Encounter Medications as of 12/27/2024  Medication Sig   Albuterol  Sulfate (PROAIR  RESPICLICK) 108 (90 Base) MCG/ACT AEPB Inhale 2 puffs into the lungs every 6 (six) hours as needed.   Blood Glucose Monitoring Suppl DEVI 1 each by Does not apply route in the morning, at noon, and at bedtime. May substitute to any manufacturer covered by patient's insurance.   finasteride  (PROSCAR ) 5 MG tablet Take 1 tablet (5 mg total) by mouth daily.   Fluticasone -Umeclidin-Vilant (TRELEGY ELLIPTA ) 100-62.5-25 MCG/ACT AEPB USE 1 INHALATION ORALLY    DAILY   ipratropium-albuterol  (DUONEB) 0.5-2.5 (3) MG/3ML SOLN Take 3 mLs by nebulization every 6 (six) hours.   lidocaine  (LIDODERM ) 5 % PLACE 1 PATCH ONTO THE SKIN DAILY. REMOVE AND DISCARD PATCH WITHIN 12 HOURS OR AS DIRECTED BY MD   lisinopril  (ZESTRIL ) 10 MG tablet TAKE 1 TABLET DAILY   metFORMIN  (GLUCOPHAGE ) 500 MG tablet Take 1 tablet (500 mg total) by mouth daily.   naproxen  (NAPROSYN ) 500 MG tablet Take 500 mg by mouth 2 (two) times daily.   pravastatin  (PRAVACHOL ) 40 MG tablet TAKE 1 TABLET DAILY   Semaglutide ,0.25 or 0.5MG /DOS, (OZEMPIC , 0.25 OR 0.5 MG/DOSE,) 2 MG/3ML SOPN Inject 0.5 mg into the skin every 7 (seven) days.   tamsulosin  (FLOMAX ) 0.4 MG CAPS capsule Take 1 capsule (0.4 mg total) by mouth daily.   No facility-administered encounter medications on file as of 12/27/2024.    History: Past Medical History:  Diagnosis Date   Arthritis    Closed fracture of parietal bone (HCC) 12/03/2023   COPD, severe (HCC)    pulmologist-  dr byrum   Dyspnea on exertion    prescribed to use O2 via [name]--- per pt he does not use O2 he just take 1-2 puffs of rescue inhaler   Hiatal hernia    History of acute respiratory failure    02-18-2007  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    Hypertension    Hypoxemic respiratory failure, chronic (HCC)    pulmologist-  dr byrum-- prescribed supplemental O2 @ 2L via [name]    Lobar pneumonia 02/27/2024   OSA on CPAP    and added O2 @ 2L via [name] w/ cpap   Phimosis    S/P balloon dilatation of esophageal stricture    multiple    Sepsis due to undetermined organism (HCC) 02/27/2024   Subdural hematoma (HCC)    in July '24 after fall   Type 2 diabetes mellitus (HCC)    followed by pcp--  per note uncontrolled w/ last A1c >14 on 12-30-2016   Wears contact lenses    Past Surgical History:  Procedure Laterality Date   BACK SURGERY     CIRCUMCISION N/A 01/27/2017   Procedure: CIRCUMCISION ADULT;  Surgeon: Garnette Shack, MD;  Location: [facility];  Service: Urology;  Laterality: N/A;   KNEE ARTHROSCOPY Right 2008   LUMBAR SPINE SURGERY  1977  approx.   TONSILLECTOMY AND ADENOIDECTOMY  age 66   TRANSTHORACIC ECHOCARDIOGRAM  09/12/2015   mild LVH,  ef 65-70%,  grade 1 diastolic dysfunction/  mild RAE   Family History  Problem Relation Age of Onset   Asthma Mother    COPD Mother    Cancer Maternal Grandmother        stomach   Early death Father 21  Tractor Accident   Social History   Occupational History   Occupation: Retired    Comment: Librarian, Academic  Tobacco Use   Smoking status: Former    Current packs/day: 0.00    Average packs/day: 2.0 packs/day for 40.0 years (80.0 ttl pk-yrs)    Types: Cigarettes    Start date: 12/06/1966    Quit date: 12/06/2006    Years since quitting: 18.0   Smokeless tobacco: Never  Vaping Use   Vaping status: Never Used  Substance and Sexual Activity   Alcohol use: Yes    Alcohol/week: 0.0 standard drinks of alcohol    Comment: VERY LITTLE   Drug use: No   Sexual activity: Not on file   Tobacco Counseling Counseling given: Yes  SDOH Screenings   Food Insecurity: No Food Insecurity (12/27/2024)  Housing: Low Risk (12/27/2024)   Transportation Needs: No Transportation Needs (12/27/2024)  Utilities: Not At Risk (12/27/2024)  Alcohol Screen: Low Risk (12/23/2023)  Depression (PHQ2-9): Low Risk (12/27/2024)  Financial Resource Strain: Low Risk (12/23/2023)  Physical Activity: Insufficiently Active (12/27/2024)  Social Connections: Socially Integrated (12/27/2024)  Stress: No Stress Concern Present (12/27/2024)  Tobacco Use: Medium Risk (12/27/2024)  Health Literacy: Adequate Health Literacy (12/27/2024)   See flowsheets for full screening details  Depression Screen Depression Screening Exception Documentation Depression Screening Exception:: Patient refusal  PHQ 2 & 9 Depression Scale- Over the past 2 weeks, how often have you been bothered by any of the following problems? Little interest or pleasure in doing things: 0 Feeling down, depressed, or hopeless (PHQ Adolescent also includes...irritable): 0 PHQ-2 Total Score: 0 Trouble falling or staying asleep, or sleeping too much: 0 Feeling tired or having little energy: 0 Poor appetite or overeating (PHQ Adolescent also includes...weight loss): 0 Feeling bad about yourself - or that you are a failure or have let yourself or your family down: 0 Trouble concentrating on things, such as reading the newspaper or watching television (PHQ Adolescent also includes...like school work): 0 Moving or speaking so slowly that other people could have noticed. Or the opposite - being so fidgety or restless that you have been moving around a lot more than usual: 0 Thoughts that you would be better off dead, or of hurting yourself in some way: 0 PHQ-9 Total Score: 0 If you checked off any problems, how difficult have these problems made it for you to do your work, take care of things at home, or get along with other people?: Not difficult at all  Depression Treatment Depression Interventions/Treatment : EYV7-0 Score <4 Follow-up Not Indicated     Goals Addressed                This Visit's Progress     Patient Stated (pt-stated)   On track     Maintain healthy diet and exercise regimen.             Objective:    Today's Vitals   12/27/24 1259  BP: 131/75  Pulse: 60  Weight: 172 lb (78 kg)  Height: 5' 9 (1.753 m)   Body mass index is 25.4 kg/m.  Hearing/Vision screen No results found. Immunizations and Health Maintenance Health Maintenance  Topic Date Due   Hepatitis C Screening  11/16/2023   COVID-19 Vaccine (3 - Mixed Product risk series) 12/27/2024 (Originally 03/14/2020)   DTaP/Tdap/Td (3 - Tdap) 12/11/2025 (Originally 05/17/2016)   Diabetic kidney evaluation - Urine ACR  05/29/2025   FOOT EXAM  05/29/2025   HEMOGLOBIN  A1C  06/10/2025   OPHTHALMOLOGY EXAM  11/24/2025   Diabetic kidney evaluation - eGFR measurement  12/11/2025   Medicare Annual Wellness (AWV)  12/27/2025   Pneumococcal Vaccine: 50+ Years  Completed   Influenza Vaccine  Completed   Zoster Vaccines- Shingrix  Completed   Meningococcal B Vaccine  Aged Out        Assessment/Plan:  This is a routine wellness examination for Brent Snow.  Patient Care Team: Jolinda Norene HERO, DO as PCP - General (Family Medicine) Shelah Lamar RAMAN, MD as Consulting Physician (Pulmonary Disease) Bonner Ade, MD as Consulting Physician (Physical Medicine and Rehabilitation) Correne Purchase (Inactive) Matilda Senior, MD as Consulting Physician (Urology) Janit Barter, MD as Referring Physician (Neurology)  I have personally reviewed and noted the following in the patients chart:   Medical and social history Use of alcohol, tobacco or illicit drugs  Current medications and supplements including opioid prescriptions. Functional ability and status Nutritional status Physical activity Advanced directives List of other physicians Hospitalizations, surgeries, and ER visits in previous 12 months Vitals Screenings to include cognitive, depression, and falls Referrals and  appointments  No orders of the defined types were placed in this encounter.  In addition, I have reviewed and discussed with patient certain preventive protocols, quality metrics, and best practice recommendations. A written personalized care plan for preventive services as well as general preventive health recommendations were provided to patient.   Ozie Ned, CMA   12/27/2024   Return in 1 year (on 12/27/2025).  After Visit Summary: (MyChart) Due to this being a telephonic visit, the after visit summary with patients personalized plan was offered to patient via MyChart   Nurse Notes: pt is aware due the following: lab work-Hep C screening/will get it done at next OV w/pcp 7/26 "

## 2024-12-27 NOTE — Patient Instructions (Signed)
 Brent Snow,  Thank you for taking the time for your Medicare Wellness Visit. I appreciate your continued commitment to your health goals. Please review the care plan we discussed, and feel free to reach out if I can assist you further.  Please note that Annual Wellness Visits do not include a physical exam. Some assessments may be limited, especially if the visit was conducted virtually. If needed, we may recommend an in-person follow-up with your provider.  Ongoing Care Seeing your primary care provider every 3 to 6 months helps us  monitor your health and provide consistent, personalized care.   Referrals If a referral was made during today's visit and you haven't received any updates within two weeks, please contact the referred provider directly to check on the status.  Recommended Screenings:  Health Maintenance  Topic Date Due   Hepatitis C Screening  11/16/2023   Medicare Annual Wellness Visit  12/22/2024   COVID-19 Vaccine (3 - Mixed Product risk series) 12/27/2024*   DTaP/Tdap/Td vaccine (3 - Tdap) 12/11/2025*   Kidney health urinalysis for diabetes  05/29/2025   Complete foot exam   05/29/2025   Hemoglobin A1C  06/10/2025   Eye exam for diabetics  11/24/2025   Yearly kidney function blood test for diabetes  12/11/2025   Pneumococcal Vaccine for age over 22  Completed   Flu Shot  Completed   Zoster (Shingles) Vaccine  Completed   Meningitis B Vaccine  Aged Out  *Topic was postponed. The date shown is not the original due date.       12/27/2024    1:04 PM  Advanced Directives  Does Patient Have a Medical Advance Directive? No  Would patient like information on creating a medical advance directive? No - Patient declined    Vision: Annual vision screenings are recommended for early detection of glaucoma, cataracts, and diabetic retinopathy. These exams can also reveal signs of chronic conditions such as diabetes and high blood pressure.  Dental: Annual dental  screenings help detect early signs of oral cancer, gum disease, and other conditions linked to overall health, including heart disease and diabetes.  Please see the attached documents for additional preventive care recommendations.

## 2025-01-07 ENCOUNTER — Other Ambulatory Visit: Payer: Self-pay | Admitting: Family Medicine

## 2025-03-12 ENCOUNTER — Ambulatory Visit: Admitting: Urology

## 2025-06-05 ENCOUNTER — Encounter: Payer: Self-pay | Admitting: Family Medicine

## 2025-12-30 ENCOUNTER — Ambulatory Visit
